# Patient Record
Sex: Female | Born: 1990 | Race: White | Hispanic: No | Smoking: Former smoker
Health system: Southern US, Community
[De-identification: ages and names within clinical notes are randomized; demographics above are authoritative.]

## PROBLEM LIST (undated history)

## (undated) ENCOUNTER — Inpatient Hospital Stay (HOSPITAL_COMMUNITY): Payer: Self-pay

## (undated) DIAGNOSIS — R87619 Unspecified abnormal cytological findings in specimens from cervix uteri: Secondary | ICD-10-CM

## (undated) DIAGNOSIS — F53 Postpartum depression: Secondary | ICD-10-CM

## (undated) DIAGNOSIS — I10 Essential (primary) hypertension: Secondary | ICD-10-CM

## (undated) DIAGNOSIS — Z3490 Encounter for supervision of normal pregnancy, unspecified, unspecified trimester: Secondary | ICD-10-CM

## (undated) DIAGNOSIS — F4310 Post-traumatic stress disorder, unspecified: Secondary | ICD-10-CM

## (undated) DIAGNOSIS — Z8619 Personal history of other infectious and parasitic diseases: Secondary | ICD-10-CM

## (undated) DIAGNOSIS — G35 Multiple sclerosis: Secondary | ICD-10-CM

## (undated) DIAGNOSIS — F99 Mental disorder, not otherwise specified: Secondary | ICD-10-CM

## (undated) DIAGNOSIS — T7840XA Allergy, unspecified, initial encounter: Secondary | ICD-10-CM

## (undated) DIAGNOSIS — E559 Vitamin D deficiency, unspecified: Secondary | ICD-10-CM

## (undated) DIAGNOSIS — B009 Herpesviral infection, unspecified: Secondary | ICD-10-CM

## (undated) DIAGNOSIS — IMO0002 Reserved for concepts with insufficient information to code with codable children: Secondary | ICD-10-CM

## (undated) DIAGNOSIS — R87629 Unspecified abnormal cytological findings in specimens from vagina: Secondary | ICD-10-CM

## (undated) DIAGNOSIS — O99345 Other mental disorders complicating the puerperium: Secondary | ICD-10-CM

## (undated) DIAGNOSIS — N390 Urinary tract infection, site not specified: Secondary | ICD-10-CM

## (undated) DIAGNOSIS — F418 Other specified anxiety disorders: Secondary | ICD-10-CM

## (undated) DIAGNOSIS — K219 Gastro-esophageal reflux disease without esophagitis: Secondary | ICD-10-CM

## (undated) DIAGNOSIS — D649 Anemia, unspecified: Secondary | ICD-10-CM

## (undated) DIAGNOSIS — F603 Borderline personality disorder: Secondary | ICD-10-CM

## (undated) DIAGNOSIS — M21371 Foot drop, right foot: Secondary | ICD-10-CM

## (undated) DIAGNOSIS — R011 Cardiac murmur, unspecified: Secondary | ICD-10-CM

## (undated) DIAGNOSIS — H469 Unspecified optic neuritis: Secondary | ICD-10-CM

## (undated) DIAGNOSIS — F419 Anxiety disorder, unspecified: Secondary | ICD-10-CM

## (undated) HISTORY — DX: Vitamin D deficiency, unspecified: E55.9

## (undated) HISTORY — DX: Anxiety disorder, unspecified: F41.9

## (undated) HISTORY — DX: Anemia, unspecified: D64.9

## (undated) HISTORY — DX: Unspecified abnormal cytological findings in specimens from vagina: R87.629

## (undated) HISTORY — DX: Allergy, unspecified, initial encounter: T78.40XA

## (undated) HISTORY — DX: Mental disorder, not otherwise specified: F99

## (undated) HISTORY — DX: Personal history of other infectious and parasitic diseases: Z86.19

## (undated) HISTORY — DX: Unspecified optic neuritis: H46.9

## (undated) HISTORY — DX: Postpartum depression: F53.0

## (undated) HISTORY — DX: Essential (primary) hypertension: I10

## (undated) HISTORY — DX: Other mental disorders complicating the puerperium: O99.345

## (undated) HISTORY — DX: Cardiac murmur, unspecified: R01.1

## (undated) HISTORY — DX: Herpesviral infection, unspecified: B00.9

## (undated) HISTORY — DX: Foot drop, right foot: M21.371

## (undated) HISTORY — DX: Reserved for concepts with insufficient information to code with codable children: IMO0002

## (undated) HISTORY — DX: Gastro-esophageal reflux disease without esophagitis: K21.9

## (undated) HISTORY — DX: Urinary tract infection, site not specified: N39.0

## (undated) HISTORY — DX: Unspecified abnormal cytological findings in specimens from cervix uteri: R87.619

---
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
## 2008-02-27 ENCOUNTER — Emergency Department (HOSPITAL_COMMUNITY): Admission: EM | Admit: 2008-02-27 | Discharge: 2008-02-27 | Payer: Self-pay | Admitting: Emergency Medicine

## 2008-04-06 ENCOUNTER — Ambulatory Visit: Payer: Self-pay | Admitting: Obstetrics and Gynecology

## 2008-04-06 ENCOUNTER — Inpatient Hospital Stay (HOSPITAL_COMMUNITY): Admission: AD | Admit: 2008-04-06 | Discharge: 2008-04-06 | Payer: Self-pay | Admitting: Family Medicine

## 2008-04-07 ENCOUNTER — Ambulatory Visit: Payer: Self-pay | Admitting: Family Medicine

## 2008-04-07 ENCOUNTER — Inpatient Hospital Stay (HOSPITAL_COMMUNITY): Admission: AD | Admit: 2008-04-07 | Discharge: 2008-04-10 | Payer: Self-pay | Admitting: Obstetrics & Gynecology

## 2009-04-23 ENCOUNTER — Other Ambulatory Visit: Admission: RE | Admit: 2009-04-23 | Discharge: 2009-04-23 | Payer: Self-pay | Admitting: Obstetrics & Gynecology

## 2010-05-15 HISTORY — PX: WISDOM TOOTH EXTRACTION: SHX21

## 2011-02-09 LAB — URINALYSIS, ROUTINE W REFLEX MICROSCOPIC
Bilirubin Urine: NEGATIVE
Glucose, UA: NEGATIVE
Hgb urine dipstick: NEGATIVE
Ketones, ur: NEGATIVE
Nitrite: NEGATIVE
Protein, ur: NEGATIVE
Specific Gravity, Urine: 1.03
Urobilinogen, UA: 0.2
pH: 5.5

## 2011-02-09 LAB — URINE MICROSCOPIC-ADD ON

## 2011-02-13 LAB — URINALYSIS, ROUTINE W REFLEX MICROSCOPIC
Bilirubin Urine: NEGATIVE
Hgb urine dipstick: NEGATIVE
Ketones, ur: 40 — AB
Nitrite: NEGATIVE
Protein, ur: NEGATIVE
Specific Gravity, Urine: 1.02
Urobilinogen, UA: 0.2
pH: 7

## 2011-02-13 LAB — URINE MICROSCOPIC-ADD ON

## 2011-02-14 LAB — CBC
HCT: 34.7 — ABNORMAL LOW
MCHC: 33.3
MCV: 85.2
Platelets: 265
RDW: 14.1

## 2011-02-14 LAB — URINE MICROSCOPIC-ADD ON

## 2011-02-14 LAB — URINALYSIS, ROUTINE W REFLEX MICROSCOPIC
Hgb urine dipstick: NEGATIVE
Protein, ur: NEGATIVE
Specific Gravity, Urine: <1.005 — ABNORMAL LOW
Urobilinogen, UA: 0.2

## 2011-02-14 LAB — COMPREHENSIVE METABOLIC PANEL
AST: 17
Albumin: 2.8 — ABNORMAL LOW
BUN: 7
Calcium: 9.3
Creatinine, Ser: 0.54
GFR calc Af Amer: >60
Total Bilirubin: 0.3
Total Protein: 6

## 2011-02-14 LAB — RPR: RPR Ser Ql: NONREACTIVE

## 2011-02-21 LAB — COMPREHENSIVE METABOLIC PANEL
ALT: 15
AST: 20
Alkaline Phosphatase: 70
CO2: 28
Calcium: 9.4
Potassium: 3.8
Sodium: 140
Total Protein: 7.5

## 2011-02-21 LAB — PREGNANCY, URINE: Preg Test, Ur: NEGATIVE

## 2011-02-21 LAB — DIFFERENTIAL
Eosinophils Absolute: 0.1 — ABNORMAL LOW
Eosinophils Relative: 1
Lymphs Abs: 2.2
Monocytes Relative: 4

## 2011-02-21 LAB — CBC
MCHC: 33.9
RBC: 4.52
RDW: 14.1

## 2011-02-21 LAB — URINALYSIS, ROUTINE W REFLEX MICROSCOPIC
Bilirubin Urine: NEGATIVE
Nitrite: NEGATIVE
Specific Gravity, Urine: 1.02
Urobilinogen, UA: 0.2

## 2011-08-23 ENCOUNTER — Other Ambulatory Visit (HOSPITAL_COMMUNITY)
Admission: RE | Admit: 2011-08-23 | Discharge: 2011-08-23 | Disposition: A | Discharge status: Home / Self Care | Payer: Medicaid Other | Source: Ambulatory Visit | Attending: Obstetrics and Gynecology | Admitting: Obstetrics and Gynecology

## 2011-08-23 DIAGNOSIS — Z01419 Encounter for gynecological examination (general) (routine) without abnormal findings: Secondary | ICD-10-CM | POA: Insufficient documentation

## 2011-08-23 DIAGNOSIS — Z113 Encounter for screening for infections with a predominantly sexual mode of transmission: Secondary | ICD-10-CM | POA: Insufficient documentation

## 2012-08-19 ENCOUNTER — Encounter: Payer: Self-pay | Admitting: Obstetrics & Gynecology

## 2012-08-19 ENCOUNTER — Ambulatory Visit (INDEPENDENT_AMBULATORY_CARE_PROVIDER_SITE_OTHER): Payer: Medicaid Other | Admitting: Obstetrics & Gynecology

## 2012-08-19 VITALS — BP 130/84 | Ht 66.0 in | Wt 142.0 lb

## 2012-08-19 DIAGNOSIS — Z3201 Encounter for pregnancy test, result positive: Secondary | ICD-10-CM

## 2012-08-19 DIAGNOSIS — N926 Irregular menstruation, unspecified: Secondary | ICD-10-CM

## 2012-08-20 ENCOUNTER — Other Ambulatory Visit: Payer: Self-pay | Admitting: Obstetrics & Gynecology

## 2012-08-20 DIAGNOSIS — O3680X0 Pregnancy with inconclusive fetal viability, not applicable or unspecified: Secondary | ICD-10-CM

## 2012-08-22 ENCOUNTER — Telehealth: Payer: Self-pay | Admitting: Adult Health

## 2012-08-22 NOTE — Telephone Encounter (Signed)
Pt states has a a discharge, white in color no odor,itching, or irritation. Pt told normal to have a discharge during pregnancy. Pt to call office back if vaginal bleeding, itching, odor, or irritation with discharge. Pt verbalized understanding.

## 2012-08-26 ENCOUNTER — Other Ambulatory Visit: Payer: Self-pay | Admitting: Obstetrics & Gynecology

## 2012-08-26 ENCOUNTER — Ambulatory Visit (INDEPENDENT_AMBULATORY_CARE_PROVIDER_SITE_OTHER): Payer: Medicaid Other

## 2012-08-26 DIAGNOSIS — O26849 Uterine size-date discrepancy, unspecified trimester: Secondary | ICD-10-CM

## 2012-08-26 DIAGNOSIS — O3680X0 Pregnancy with inconclusive fetal viability, not applicable or unspecified: Secondary | ICD-10-CM

## 2012-08-27 LAB — US OB TRANSVAGINAL

## 2012-08-28 ENCOUNTER — Telehealth: Payer: Self-pay | Admitting: Obstetrics & Gynecology

## 2012-08-28 MED ORDER — ONDANSETRON HCL 8 MG PO TABS: 8.0000 mg | ORAL_TABLET | Freq: Three times a day (TID) | ORAL | Status: DC | PRN

## 2012-08-28 NOTE — Telephone Encounter (Signed)
Pt c/o nausea and vomiting, requesting Zofran

## 2012-08-28 NOTE — Telephone Encounter (Signed)
Left message zofran ordered

## 2012-09-16 ENCOUNTER — Encounter: Payer: Self-pay | Admitting: *Deleted

## 2012-09-17 ENCOUNTER — Encounter: Payer: Self-pay | Admitting: Advanced Practice Midwife

## 2012-09-17 ENCOUNTER — Other Ambulatory Visit: Payer: Self-pay | Admitting: Obstetrics & Gynecology

## 2012-09-17 ENCOUNTER — Ambulatory Visit (INDEPENDENT_AMBULATORY_CARE_PROVIDER_SITE_OTHER): Payer: Medicaid Other | Admitting: Advanced Practice Midwife

## 2012-09-17 ENCOUNTER — Other Ambulatory Visit (HOSPITAL_COMMUNITY)
Admission: RE | Admit: 2012-09-17 | Discharge: 2012-09-17 | Disposition: A | Discharge status: Home / Self Care | Payer: Medicaid Other | Source: Ambulatory Visit | Attending: Obstetrics and Gynecology | Admitting: Obstetrics and Gynecology

## 2012-09-17 VITALS — BP 120/74 | Wt 142.5 lb

## 2012-09-17 DIAGNOSIS — B009 Herpesviral infection, unspecified: Secondary | ICD-10-CM

## 2012-09-17 DIAGNOSIS — Z3480 Encounter for supervision of other normal pregnancy, unspecified trimester: Secondary | ICD-10-CM | POA: Insufficient documentation

## 2012-09-17 DIAGNOSIS — O210 Mild hyperemesis gravidarum: Secondary | ICD-10-CM

## 2012-09-17 DIAGNOSIS — Z331 Pregnant state, incidental: Secondary | ICD-10-CM

## 2012-09-17 DIAGNOSIS — Z113 Encounter for screening for infections with a predominantly sexual mode of transmission: Secondary | ICD-10-CM | POA: Insufficient documentation

## 2012-09-17 DIAGNOSIS — Z01419 Encounter for gynecological examination (general) (routine) without abnormal findings: Secondary | ICD-10-CM | POA: Insufficient documentation

## 2012-09-17 DIAGNOSIS — Z1389 Encounter for screening for other disorder: Secondary | ICD-10-CM

## 2012-09-17 LAB — POCT URINALYSIS DIPSTICK: Nitrite, UA: NEGATIVE

## 2012-09-17 MED ORDER — ONDANSETRON HCL 8 MG PO TABS: 8.0000 mg | ORAL_TABLET | Freq: Three times a day (TID) | ORAL | Status: DC | PRN

## 2012-09-17 MED ORDER — ACYCLOVIR 400 MG PO TABS: 400.0000 mg | ORAL_TABLET | Freq: Three times a day (TID) | ORAL | Status: DC

## 2012-09-17 NOTE — Progress Notes (Signed)
Subjective:    Haley [name] is a G2P1001 [redacted]w[redacted]d being seen today for her first obstetrical visit.  Her obstetrical history is significant for HSV.  Pregnancy history fully reviewed.  Patient reports nausea. Requests 30 zofran/month  Filed Vitals:   09/17/12 1351  BP: 120/74  Weight: 142 lb 8 oz (64.638 kg)    HISTORY: OB History   Grav Para Term Preterm Abortions TAB SAB Ect Mult Living   2 1 1       1      # Outc Date GA Lbr Len/2nd Wgt Sex Del Anes PTL Lv   1 TRM 2009 [redacted]w[redacted]d  6lb9oz(2.977kg) M SVD EPI  Yes   2 CUR              Past Medical History  Diagnosis Date  . Heart murmur   . Hx of chlamydia infection   . Abnormal Pap smear   . HSV-2 (herpes simplex virus 2) infection    Past Surgical History  Procedure Laterality Date  . Wisdom tooth extraction  2012   Family History  Problem Relation Age of Onset  . Cancer Paternal Grandmother     breast  . Anxiety disorder Mother   . Heart disease Mother   . Stroke Paternal Aunt      Exam       Pelvic Exam:    Perineum: HSV on perineum:  Started 2 days ago   Vulva: normal   Vagina:  normal mucosa, normal discharge, no palpable nodules   Uterus         Cervix: normal   Adnexa: Not palpable   Urinary:  urethral meatus normal    System: Breast:  normal appearance, no masses or tenderness   Skin: normal coloration and turgor, no rashes    Neurologic: oriented, normal, normal mood   Extremities: normal strength, tone, and muscle mass   HEENT PERRLA   Mouth/Teeth mucous membranes moist, pharynx normal without lesions   Neck supple and no masses   Cardiovascular: regular rate and rhythm   Respiratory:  appears well, vitals normal, no respiratory distress, acyanotic, normal RR   Abdomen: soft, non-tender; bowel sounds normal; no masses,  no organomegaly          Assessment:    Pregnancy: G2P1001 Patient Active Problem List   Diagnosis Date Noted  . Supervision of other normal pregnancy 09/17/2012   Priority: Low  . HSV-2 (herpes simplex virus 2) infection     Priority: Low        Plan:    HSV prophylaxis starting now (FOB is serum negative) Test for CF (son is + for carrier gene) Initial labs drawn. Prenatal vitamins. Problem list reviewed and updated. Genetic Screening discussed Integrated Screen: requested.  Ultrasound discussed; fetal survey: requested.  Follow up in 4 weeks.  CRESENZO-DISHMAN,Haley [name] 09/17/2012       U/S from 08/20/12:Haley [name] is a 22 y.o. year old G2P1001 with LMP Patient's last menstrual period was 07/06/2012, unsure. which would correlate to  7 weeks 2 days gestation.  She is here today for a confirmatory initial sonogram.  Please refer to the scanned image of the sonogram worksheet  which is found in the media tab of the chart review section in the patient's EMR. All measures and details of the anatomical scan, placentation, fluid volume and pelvic anatomy are contained in that report and should be considered as the primary source of specific information for this study.  Impression:  Single viable  intrauterine pregnancy is identified with a CRL of 2 mm, corresponding to an EGA of [redacted] weeks and 3 days.  This measurement  is not in agreement with the dating as stated above.  As a result, the working EDD is now 12.12.2014.  The fetal heart rate is 104 bpm.  Otherwise the sonogram is normal.  The ovaries are normal.  The cervix is long and closed.

## 2012-09-17 NOTE — Assessment & Plan Note (Signed)
FOB is serum negative, so pt on prophylaxis 09/17/2012

## 2012-09-17 NOTE — Progress Notes (Signed)
Clear discharge with odor, questionable herpes outbreak

## 2012-09-18 LAB — URINALYSIS, ROUTINE W REFLEX MICROSCOPIC
Glucose, UA: NEGATIVE mg/dL
Leukocytes, UA: NEGATIVE
Nitrite: NEGATIVE
Protein, ur: NEGATIVE mg/dL
Urobilinogen, UA: 0.2 mg/dL (ref 0.0–1.0)

## 2012-09-18 LAB — DRUG SCREEN, URINE, NO CONFIRMATION
Cocaine Metabolites: NEGATIVE
Creatinine,U: 395 mg/dL
Opiate Screen, Urine: NEGATIVE
Phencyclidine (PCP): NEGATIVE

## 2012-09-18 LAB — CBC
Hemoglobin: 13.1 g/dL (ref 12.0–15.0)
MCH: 27.9 pg (ref 26.0–34.0)
MCHC: 33.7 g/dL (ref 30.0–36.0)
Platelets: 303 10*3/uL (ref 150–400)

## 2012-09-18 LAB — HIV ANTIBODY (ROUTINE TESTING W REFLEX): HIV: NONREACTIVE

## 2012-09-18 LAB — ANTIBODY SCREEN: Antibody Screen: NEGATIVE

## 2012-09-18 LAB — ABO AND RH: Rh Type: POSITIVE

## 2012-09-19 ENCOUNTER — Telehealth: Payer: Self-pay | Admitting: Advanced Practice Midwife

## 2012-09-19 NOTE — Telephone Encounter (Signed)
Routed to [place]. JSY

## 2012-09-23 NOTE — Telephone Encounter (Signed)
Left message. JSY 

## 2012-09-25 NOTE — Telephone Encounter (Signed)
Spoke with pt. Pt had a question about Acyclovir 400mg  1TID with 3 Refills. Advised to take the med until all gone to help decrease on chance of HSV outbreak at time of delivery. Pt will take med throughout pregnancy.

## 2012-10-01 ENCOUNTER — Telehealth: Payer: Self-pay | Admitting: Obstetrics and Gynecology

## 2012-10-01 ENCOUNTER — Other Ambulatory Visit: Payer: Self-pay | Admitting: Women's Health

## 2012-10-01 DIAGNOSIS — O219 Vomiting of pregnancy, unspecified: Secondary | ICD-10-CM

## 2012-10-01 MED ORDER — DOXYLAMINE-PYRIDOXINE 10-10 MG PO TBEC: 10.0000 mg | DELAYED_RELEASE_TABLET | ORAL | Status: DC

## 2012-10-01 NOTE — Telephone Encounter (Signed)
Pt states Zofran not helping, would like another med for N/V.

## 2012-10-01 NOTE — Telephone Encounter (Signed)
Left message that Rx for diclegis has been e-scribed to her preferred pharmacy. Marge Duncans

## 2012-10-08 DIAGNOSIS — Z79899 Other long term (current) drug therapy: Secondary | ICD-10-CM | POA: Insufficient documentation

## 2012-10-08 DIAGNOSIS — Z880 Allergy status to penicillin: Secondary | ICD-10-CM | POA: Insufficient documentation

## 2012-10-08 DIAGNOSIS — O9989 Other specified diseases and conditions complicating pregnancy, childbirth and the puerperium: Secondary | ICD-10-CM | POA: Insufficient documentation

## 2012-10-08 DIAGNOSIS — Z8619 Personal history of other infectious and parasitic diseases: Secondary | ICD-10-CM | POA: Insufficient documentation

## 2012-10-08 DIAGNOSIS — Z87891 Personal history of nicotine dependence: Secondary | ICD-10-CM | POA: Insufficient documentation

## 2012-10-08 DIAGNOSIS — K5900 Constipation, unspecified: Secondary | ICD-10-CM | POA: Insufficient documentation

## 2012-10-08 DIAGNOSIS — R011 Cardiac murmur, unspecified: Secondary | ICD-10-CM | POA: Insufficient documentation

## 2012-10-09 ENCOUNTER — Encounter (HOSPITAL_COMMUNITY): Payer: Self-pay

## 2012-10-09 ENCOUNTER — Emergency Department (HOSPITAL_COMMUNITY)
Admission: EM | Admit: 2012-10-09 | Discharge: 2012-10-09 | Disposition: A | Discharge status: Home / Self Care | Payer: Medicaid Other | Attending: Emergency Medicine | Admitting: Emergency Medicine

## 2012-10-09 DIAGNOSIS — Z3490 Encounter for supervision of normal pregnancy, unspecified, unspecified trimester: Secondary | ICD-10-CM

## 2012-10-09 DIAGNOSIS — K5900 Constipation, unspecified: Secondary | ICD-10-CM

## 2012-10-09 HISTORY — DX: Encounter for supervision of normal pregnancy, unspecified, unspecified trimester: Z34.90

## 2012-10-09 MED ORDER — BISACODYL 10 MG RE SUPP
10.0000 mg | Freq: Once | RECTAL | Status: AC
  Administered 2012-10-09: 10 mg via RECTAL
  Filled 2012-10-09: qty 1

## 2012-10-09 NOTE — ED Notes (Signed)
Pt reporting some minor results from suppository, but states she would really like to try the clear drink she had before.  Pt unsure what it was she took that worked, but thinks it might be mag citrate.  EDP aware, and in to talk to pt.

## 2012-10-09 NOTE — ED Provider Notes (Signed)
History     CSN: [id number]  Arrival date & time 10/08/12  2348   First MD Initiated Contact with Patient 10/09/12 0054      Chief Complaint  Patient presents with  . Constipation    (Consider location/radiation/quality/duration/timing/severity/associated sxs/prior treatment) HPI  Patient states she is 11 weeks 5 days pregnant. She states she has chronic nausea and takes Zofran twice a day. She reports she thinks that Zofran is making her constipated. She states she has not had a bowel movement in one month. She states every other day she'll pass 3 "pellets". She states she has pain in her rectum and her abdomen. She states she had some dry heaves earlier today. She denies any fever, vaginal bleeding or spotting. She states her appetite however is normal. She states all she is taking is MiraLAX one dose every other day.  OB Dr. Emelda Fear  Past Medical History  Diagnosis Date  . Heart murmur   . Hx of chlamydia infection   . Abnormal Pap smear   . HSV-2 (herpes simplex virus 2) infection   . Pregnant     Past Surgical History  Procedure Laterality Date  . Wisdom tooth extraction  2012    Family History  Problem Relation Age of Onset  . Cancer Paternal Grandmother     breast  . Anxiety disorder Mother   . Heart disease Mother   . Stroke Paternal Aunt     History  Substance Use Topics  . Smoking status: Former Smoker    Types: Cigarettes    Quit date: 12/14/2010  . Smokeless tobacco: Never Used  . Alcohol Use: No   unemployed  OB History   Grav Para Term Preterm Abortions TAB SAB Ect Mult Living   2 1 1       1       Review of Systems  All other systems reviewed and are negative.    Allergies  Penicillins  Home Medications   Current Outpatient Rx  Name  Route  Sig  Dispense  Refill  . acyclovir (ZOVIRAX) 400 MG tablet   Oral   Take 1 tablet (400 mg total) by mouth 3 (three) times daily.   90 tablet   3   . Doxylamine-Pyridoxine (DICLEGIS) 10-10  MG TBEC   Oral   Take 10 mg by mouth See admin instructions.   100 tablet   4     2 tabs q hs, if sx persist add 1 tab q am on day 3 ...   . ondansetron (ZOFRAN) 8 MG tablet   Oral   Take 1 tablet (8 mg total) by mouth every 8 (eight) hours as needed for nausea.   30 tablet   3   . polyethylene glycol (MIRALAX / GLYCOLAX) packet   Oral   Take 17 g by mouth daily.         . Prenatal Vit-Fe Fumarate-FA (PRENATAL MULTIVITAMIN) TABS   Oral   Take 1 tablet by mouth daily at 12 noon.         . Pediatric Multiple Vit-C-FA (FLINSTONES GUMMIES OMEGA-3 DHA PO)   Oral   Take by mouth 2 (two) times daily.           BP 133/65  Pulse 93  Temp(Src) 99.9 F (37.7 C) (Oral)  Resp 20  Ht 5\' 1"  (1.549 m)  Wt 141 lb (63.957 kg)  BMI 26.66 kg/m2  SpO2 100%  LMP 07/21/2012  Vital signs normal  Physical Exam  Nursing note and vitals reviewed. Constitutional: She is oriented to person, place, and time. She appears well-developed and well-nourished.  Non-toxic appearance. She does not appear ill. She appears distressed.  HENT:  Head: Normocephalic and atraumatic.  Right Ear: External ear normal.  Left Ear: External ear normal.  Nose: Nose normal. No mucosal edema or rhinorrhea.  Mouth/Throat: Oropharynx is clear and moist and mucous membranes are normal. No dental abscesses or edematous.  Eyes: Conjunctivae and EOM are normal. Pupils are equal, round, and reactive to light.  Neck: Normal range of motion and full passive range of motion without pain. Neck supple.  Cardiovascular: Normal rate, regular rhythm and normal heart sounds.  Exam reveals no gallop and no friction rub.   No murmur heard. Pulmonary/Chest: Effort normal and breath sounds normal. No respiratory distress. She has no wheezes. She has no rhonchi. She has no rales. She exhibits no tenderness and no crepitus.  Abdominal: Soft. Normal appearance and bowel sounds are normal. She exhibits no distension. There is  tenderness. There is no rebound and no guarding.  Mildly tender but soft abdomen  Musculoskeletal: Normal range of motion. She exhibits no edema and no tenderness.  Moves all extremities well.   Neurological: She is alert and oriented to person, place, and time. She has normal strength. No cranial nerve deficit.  Skin: Skin is warm, dry and intact. No rash noted. No erythema. No pallor.  Psychiatric: Her speech is normal and behavior is normal. Her mood appears not anxious.  anxious    ED Course  Procedures (including critical care time)  Medications  bisacodyl (DULCOLAX) suppository 10 mg (10 mg Rectal Given 10/09/12 0140)    Patient had small results after her suppository. We discussed what she can take at home for her constipation.   1. Constipation   2. Pregnancy     Meds OTC miralax and dulcolax suppositories  Plan discharge  Devoria Albe, MD, FACEP    MDM          Ward Givens, MD 10/09/12 [phone]

## 2012-10-09 NOTE — ED Notes (Signed)
Pt states she is approx [redacted] weeks pregnant, has been constipated and has not had a good bowel movement for about  A month.  Pt using miralax without relief

## 2012-10-09 NOTE — ED Notes (Signed)
Pt reporting she has been constipated for several weeks.  States that this happened with previous pregnancy and she had relief with "some clear drink".  Pt has been taking Miralax at home with no relief.

## 2012-10-15 ENCOUNTER — Encounter: Payer: Self-pay | Admitting: Women's Health

## 2012-10-15 ENCOUNTER — Other Ambulatory Visit: Payer: Self-pay | Admitting: Obstetrics & Gynecology

## 2012-10-15 ENCOUNTER — Ambulatory Visit (INDEPENDENT_AMBULATORY_CARE_PROVIDER_SITE_OTHER): Payer: Medicaid Other | Admitting: Women's Health

## 2012-10-15 ENCOUNTER — Ambulatory Visit (INDEPENDENT_AMBULATORY_CARE_PROVIDER_SITE_OTHER): Payer: Medicaid Other

## 2012-10-15 VITALS — BP 120/80 | Wt 141.4 lb

## 2012-10-15 DIAGNOSIS — Z1389 Encounter for screening for other disorder: Secondary | ICD-10-CM

## 2012-10-15 DIAGNOSIS — Z3482 Encounter for supervision of other normal pregnancy, second trimester: Secondary | ICD-10-CM

## 2012-10-15 DIAGNOSIS — O9989 Other specified diseases and conditions complicating pregnancy, childbirth and the puerperium: Secondary | ICD-10-CM

## 2012-10-15 DIAGNOSIS — B009 Herpesviral infection, unspecified: Secondary | ICD-10-CM

## 2012-10-15 DIAGNOSIS — Z36 Encounter for antenatal screening of mother: Secondary | ICD-10-CM

## 2012-10-15 DIAGNOSIS — O98519 Other viral diseases complicating pregnancy, unspecified trimester: Secondary | ICD-10-CM

## 2012-10-15 DIAGNOSIS — Z331 Pregnant state, incidental: Secondary | ICD-10-CM

## 2012-10-15 DIAGNOSIS — Z3480 Encounter for supervision of other normal pregnancy, unspecified trimester: Secondary | ICD-10-CM

## 2012-10-15 LAB — POCT URINALYSIS DIPSTICK
Blood, UA: NEGATIVE
Glucose, UA: NEGATIVE
Leukocytes, UA: NEGATIVE
Nitrite, UA: NEGATIVE

## 2012-10-15 NOTE — Progress Notes (Signed)
Right ankle hurts. Pain in left side. Major constipation issues.

## 2012-10-15 NOTE — Progress Notes (Addendum)
Denies cramping, lof, vb, urinary frequency, urgency, hesitancy, or dysuria.  Reports RLP and constipation not relieved by qod miralax.  Reviewed warning s/s to report and constipation relief measures.  All questions answered. 1st IT/NT today. 3 samples [facility] given per request. F/U in 4wks for visit and 2nd IT.

## 2012-10-15 NOTE — Patient Instructions (Signed)
Constipation  Drink plenty of fluid, preferably water, throughout the day  Eat foods high in fiber such as fruits, vegetables, and grains  Exercise, such as walking, is a good way to keep your bowels regular  Drink warm fluids, especially warm prune juice, or decaf coffee  Eat a 1/2 cup of real oatmeal (not instant), 1/2 cup applesauce, and 1/2-1 cup warm prune juice every day  If needed, you may take Colace (docusate sodium) stool softener once or twice a day to help keep the stool soft. If you are pregnant, wait until you are out of your first trimester (12-14 weeks of pregnancy)  If you still are having problems with constipation, you may take Miralax once daily as needed to help keep your bowels regular.  If you are pregnant, wait until you are out of your first trimester (12-14 weeks of pregnancy)    Pregnancy - Second Trimester The second trimester is the period between 13 to 27 weeks of your pregnancy. It is important to follow your doctor's instructions. HOME CARE   Do not smoke.  Do not drink alcohol or use drugs.  Only take medicine as told by your doctor.  Take prenatal vitamins as told. The vitamin should contain 1 milligram of folic acid.  Exercise.  Eat healthy foods. Eat regular, well-balanced meals.  You can have sex (intercourse) if there are no other problems with the pregnancy.  Do not use hot tubs, steam rooms, or saunas.  Wear a seat belt while driving.  Avoid raw meat, uncooked cheese, and litter boxes and soil used by cats.  Visit your dentist. Shirlee [place] are okay. GET HELP RIGHT AWAY IF:   You have a temperature by mouth above 102 F (38.9 C), not controlled by medicine.  Fluid is coming from your vagina.  Blood is coming from your vagina. Light spotting is common, especially after sex (intercourse).  You have a bad smelling fluid (discharge) coming from the vagina. The fluid changes from clear to white.  You still feel sick to your stomach  (nauseous).  You throw up (vomit) blood.  You lose or gain more than 2 pounds (0.9 kilograms) of weight in a week, or as suggested by your doctor.  Your face, hands, feet, or legs get puffy (swell).  You get exposed to [place] measles and have never had them.  You get exposed to fifth disease or chickenpox.  You have belly (abdominal) pain.  You have a bad headache that will not go away.  You have watery poop (diarrhea), pain when you pee (urinate), or have shortness of breath.  You start to have problems seeing (blurry or double vision).  You fall, are in a car accident, or have any kind of trauma.  There is mental or physical violence at home.  You have any concerns or worries during your pregnancy. MAKE SURE YOU:   Understand these instructions.  Will watch your condition.  Will get help right away if you are not doing well or get worse. Document Released: 07/26/2009 Document Revised: 07/24/2011 Document Reviewed: 07/26/2009 [facility] Patient Information 2014 [place], [place].

## 2012-10-15 NOTE — Progress Notes (Signed)
U/S-(12+4wks) single IUP with +FCA noted, CRL c/w prior u/s dates, NB present, NT=1.108mm, cx long and closed, bilateral adnexa wnl

## 2012-11-05 ENCOUNTER — Encounter (HOSPITAL_COMMUNITY): Payer: Self-pay | Admitting: *Deleted

## 2012-11-05 ENCOUNTER — Telehealth: Payer: Self-pay | Admitting: Obstetrics and Gynecology

## 2012-11-05 ENCOUNTER — Inpatient Hospital Stay (HOSPITAL_COMMUNITY)
Admission: AD | Admit: 2012-11-05 | Discharge: 2012-11-05 | Disposition: A | Discharge status: Home / Self Care | Payer: Medicaid Other | Source: Ambulatory Visit | Attending: Obstetrics & Gynecology | Admitting: Obstetrics & Gynecology

## 2012-11-05 DIAGNOSIS — O26892 Other specified pregnancy related conditions, second trimester: Secondary | ICD-10-CM

## 2012-11-05 DIAGNOSIS — N939 Abnormal uterine and vaginal bleeding, unspecified: Secondary | ICD-10-CM

## 2012-11-05 DIAGNOSIS — O99891 Other specified diseases and conditions complicating pregnancy: Secondary | ICD-10-CM | POA: Insufficient documentation

## 2012-11-05 DIAGNOSIS — O26859 Spotting complicating pregnancy, unspecified trimester: Secondary | ICD-10-CM | POA: Insufficient documentation

## 2012-11-05 DIAGNOSIS — G43909 Migraine, unspecified, not intractable, without status migrainosus: Secondary | ICD-10-CM | POA: Insufficient documentation

## 2012-11-05 LAB — URINALYSIS, ROUTINE W REFLEX MICROSCOPIC
Hgb urine dipstick: NEGATIVE
Nitrite: NEGATIVE
Protein, ur: NEGATIVE mg/dL
Urobilinogen, UA: 0.2 mg/dL (ref 0.0–1.0)

## 2012-11-05 LAB — WET PREP, GENITAL: Clue Cells Wet Prep HPF POC: NONE SEEN

## 2012-11-05 MED ORDER — SODIUM CHLORIDE 0.9 % IV BOLUS (SEPSIS)
1000.0000 mL | INTRAVENOUS | Status: AC
  Administered 2012-11-05: 1000 mL via INTRAVENOUS

## 2012-11-05 MED ORDER — BUTALBITAL-APAP-CAFFEINE 50-325-40 MG PO TABS: 1.0000 | ORAL_TABLET | Freq: Four times a day (QID) | ORAL | Status: DC | PRN

## 2012-11-05 MED ORDER — DIPHENHYDRAMINE HCL 50 MG/ML IJ SOLN
25.0000 mg | INTRAMUSCULAR | Status: AC
  Administered 2012-11-05: 25 mg via INTRAVENOUS
  Filled 2012-11-05: qty 1

## 2012-11-05 MED ORDER — DEXAMETHASONE SODIUM PHOSPHATE 10 MG/ML IJ SOLN
10.0000 mg | INTRAMUSCULAR | Status: AC
  Administered 2012-11-05: 10 mg via INTRAVENOUS
  Filled 2012-11-05: qty 1

## 2012-11-05 MED ORDER — METOCLOPRAMIDE HCL 5 MG/ML IJ SOLN
10.0000 mg | INTRAMUSCULAR | Status: AC
  Administered 2012-11-05: 10 mg via INTRAVENOUS
  Filled 2012-11-05: qty 2

## 2012-11-05 NOTE — MAU Provider Note (Signed)
Attestation of Attending Supervision of Advanced Practitioner (PA/CNM/NP): Evaluation and management procedures were performed by the Advanced Practitioner under my supervision and collaboration.  I have reviewed the Advanced Practitioner's note and chart, and I agree with the management and plan.  Georgio Hattabaugh, MD, FACOG Attending Obstetrician & Gynecologist Faculty Practice, [facility]  

## 2012-11-05 NOTE — MAU Provider Note (Addendum)
Chief Complaint: No chief complaint on file.   First Provider Initiated Contact with Patient 11/05/12 2017     SUBJECTIVE HPI: Haley [name] is a 22 y.o. G2P1001 pt of [facility] at [redacted]w[redacted]d by LMP who presents to maternity admissions reporting severe h/a with onset ~24 hours ago.  She also reports vaginal spotting starting today.  She is sensitive to light and wearing sunglasses upon arrival to MAU.  She denies hx of migraines.  She denies LOF, vaginal bleeding, vaginal itching/burning, urinary symptoms, h/a, dizziness, n/v, or fever/chills.     Past Medical History  Diagnosis Date  . Heart murmur   . Hx of chlamydia infection   . Abnormal Pap smear   . HSV-2 (herpes simplex virus 2) infection   . Pregnant    Past Surgical History  Procedure Laterality Date  . Wisdom tooth extraction  2012   History   Social History  . Marital Status: Single    Spouse Name: N/A    Number of Children: N/A  . Years of Education: N/A   Occupational History  . Not on file.   Social History Main Topics  . Smoking status: Former Smoker    Types: Cigarettes    Quit date: 12/14/2010  . Smokeless tobacco: Never Used  . Alcohol Use: No  . Drug Use: No  . Sexually Active: Yes    Birth Control/ Protection: None   Other Topics Concern  . Not on file   Social History Narrative  . No narrative on file   No current facility-administered medications on file prior to encounter.   Current Outpatient Prescriptions on File Prior to Encounter  Medication Sig Dispense Refill  . ondansetron (ZOFRAN) 8 MG tablet Take 1 tablet (8 mg total) by mouth every 8 (eight) hours as needed for nausea.  30 tablet  3  . Prenatal Vit-Fe Fumarate-FA (PRENATAL MULTIVITAMIN) TABS Take 1 tablet by mouth at bedtime.        Allergies  Allergen Reactions  . Penicillins Other (See Comments)    Unknown childhood reaction    ROS: Pertinent items in HPI  OBJECTIVE Blood pressure 133/78, pulse 97, temperature 98.9 F  (37.2 C), temperature source Oral, height 5' (1.524 m), weight 64.014 kg (141 lb 2 oz), last menstrual period 07/21/2012. GENERAL: Well-developed, well-nourished female in no acute distress.  HEENT: Normocephalic HEART: normal rate RESP: normal effort ABDOMEN: Soft, non-tender EXTREMITIES: Nontender, no edema NEURO: Alert and oriented Pelvic exam: Cervix pink, visually closed, without lesion, moderate amount white creamy discharge, no bleeding noted, vaginal walls and external genitalia normal Bimanual exam: Cervix 0/long/high, firm, anterior, neg CMT, uterus nontender, ~15 week size, adnexa without tenderness, enlargement, or mass  LAB RESULTS Results for orders placed during the hospital encounter of 11/05/12 (from the past 24 hour(s))  URINALYSIS, ROUTINE W REFLEX MICROSCOPIC     Status: None   Collection Time    11/05/12  7:31 PM      Result Value Range   Color, Urine YELLOW  YELLOW   APPearance CLEAR  CLEAR   Specific Gravity, Urine 1.025  1.005 - 1.030   pH 6.0  5.0 - 8.0   Glucose, UA NEGATIVE  NEGATIVE mg/dL   Hgb urine dipstick NEGATIVE  NEGATIVE   Bilirubin Urine NEGATIVE  NEGATIVE   Ketones, ur NEGATIVE  NEGATIVE mg/dL   Protein, ur NEGATIVE  NEGATIVE mg/dL   Urobilinogen, UA 0.2  0.0 - 1.0 mg/dL   Nitrite NEGATIVE  NEGATIVE  Leukocytes, UA NEGATIVE  NEGATIVE  WET PREP, GENITAL     Status: Abnormal   Collection Time    11/05/12  8:54 PM      Result Value Range   Yeast Wet Prep HPF POC NONE SEEN  NONE SEEN   Trich, Wet Prep NONE SEEN  NONE SEEN   Clue Cells Wet Prep HPF POC NONE SEEN  NONE SEEN   WBC, Wet Prep HPF POC MODERATE (*) NONE SEEN   In MAU: NS 1000 ml, Benadryl 25 mg IV, Reglan 10 mg IV, Decadron 10 mg IV   Report to [place], CNM   [employer] Certified Nurse-Midwife 11/05/2012  8:21 PM  2145: HA resolved.   Assessment: 1. Migraine   2. Vaginal spotting   3. Headache in pregnancy, second trimester     Plan: D/C home in  stable condition. Comfort measures. Pelvic rest x 1 week. Bleeding precautions. GC/CT pending. Follow-up Information   Follow up with [facility]. (Return to MAU as needed)    Contact information:   [address] [place] [address] [phone]       Medication List    TAKE these medications       acetaminophen 500 MG tablet  Commonly known as:  TYLENOL  Take 500 mg by mouth every 6 (six) hours as needed for pain (headache).     butalbital-acetaminophen-caffeine 50-325-40 MG per tablet  Commonly known as:  FIORICET  Take 1-2 tablets by mouth every 6 (six) hours as needed for headache.     ondansetron 8 MG tablet  Commonly known as:  ZOFRAN  Take 1 tablet (8 mg total) by mouth every 8 (eight) hours as needed for nausea.     prenatal multivitamin Tabs  Take 1 tablet by mouth at bedtime.       [place], [place] 11/05/2012 9:54 PM

## 2012-11-05 NOTE — MAU Note (Addendum)
PT SAYS  SHE HAS HAD  H/A  SINCE 8 PM LAST NIGHT .  TOOK 1 XS STRENGTH-   LAST NIGHT AT 9 PM . AND ANOTHER  AT 11 AM TODAY-    SAYS  THIS IS WORSE H/A SHE HAS HAD IN LIFE- NO HX OF H/A.    LAST ATE AT 11AM - BREAD- WITH TYLENOL-  VOMITED  AT 535PM AND AGAIN    AT 615PM.      COULD NOT EAT  ALL DAY   BECAUSE OF H/A.      TOOK ZOFRAN - LAST AT 11AM.  WAS TAKING DICLEGIS-  MTH AGO.  SHE CALLED [facility]-  TOLD TO COME IN.   NO BP PROBLEMS  IN OFFICE     PT HAS SUNGLASSES ON IN TRIAGE.-  NO VOMITING.

## 2012-11-05 NOTE — Telephone Encounter (Signed)
Spoke with pt. Has had a terrible headache since last pm. Took Tylenol with no help. Hurts to even open eyes. Pt in tears on the phone. Vaginal spotting, brown. No cramping. No recent bowel movement or intercourse. Advised to go to MAU at [facility]. Pt said she will go when her boyfriend gets home at 6. JSY

## 2012-11-06 LAB — GC/CHLAMYDIA PROBE AMP: CT Probe RNA: NEGATIVE

## 2012-11-07 NOTE — MAU Provider Note (Signed)
Attestation of Attending Supervision of Advanced Practitioner (PA/CNM/NP): Evaluation and management procedures were performed by the Advanced Practitioner under my supervision and collaboration.  I have reviewed the Advanced Practitioner's note and chart, and I agree with the management and plan.  Jerran Tappan, MD, FACOG Attending Obstetrician & Gynecologist Faculty Practice, [facility]  

## 2012-11-12 ENCOUNTER — Encounter: Payer: Self-pay | Admitting: Women's Health

## 2012-11-12 ENCOUNTER — Other Ambulatory Visit: Payer: Self-pay | Admitting: Women's Health

## 2012-11-12 ENCOUNTER — Ambulatory Visit (INDEPENDENT_AMBULATORY_CARE_PROVIDER_SITE_OTHER): Payer: Medicaid Other | Admitting: Women's Health

## 2012-11-12 VITALS — BP 120/72 | Wt 145.8 lb

## 2012-11-12 DIAGNOSIS — Z3482 Encounter for supervision of other normal pregnancy, second trimester: Secondary | ICD-10-CM

## 2012-11-12 DIAGNOSIS — Z331 Pregnant state, incidental: Secondary | ICD-10-CM

## 2012-11-12 DIAGNOSIS — O98519 Other viral diseases complicating pregnancy, unspecified trimester: Secondary | ICD-10-CM

## 2012-11-12 DIAGNOSIS — Z1389 Encounter for screening for other disorder: Secondary | ICD-10-CM

## 2012-11-12 LAB — POCT URINALYSIS DIPSTICK
Glucose, UA: NEGATIVE
Nitrite, UA: NEGATIVE

## 2012-11-12 NOTE — Patient Instructions (Signed)
Round Ligament Pain The round ligament is made up of muscle and fibrous tissue. It is attached to the uterus near the fallopian tube. The round ligament is located on both sides of the uterus and helps support the position of the uterus. It usually begins in the second trimester of pregnancy when the uterus comes out of the pelvis. The pain can come and go until the baby is delivered. Round ligament pain is not a serious problem and does not cause harm to the baby. CAUSE During pregnancy the uterus grows the most from the second trimester to delivery. As it grows, it stretches and slightly twists the round ligaments. When the uterus leans from one side to the other, the round ligament on the opposite side pulls and stretches. This can cause pain. SYMPTOMS  Pain can occur on one side or both sides. The pain is usually a short, sharp, and pinching-like. Sometimes it can be a dull, lingering and aching pain. The pain is located in the lower side of the abdomen or in the groin. The pain is internal and usually starts deep in the groin and moves up to the outside of the hip area. Pain can occur with:  Sudden change in position like getting out of bed or a chair.  Rolling over in bed.  Coughing or sneezing.  Walking too much.  Any type of physical activity. DIAGNOSIS  Your caregiver will make sure there are no serious problems causing the pain. When nothing serious is found, the symptoms usually indicate that the pain is from the round ligament. TREATMENT   Sit down and relax when the pain starts.  Flex your knees up to your belly.  Lay on your side with a pillow under your belly (abdomen) and another one between your legs.  Sit in a hot bath for 15 to 20 minutes or until the pain goes away. HOME CARE INSTRUCTIONS   Only take over-the-counter or prescriptions medicines for pain, discomfort or fever as directed by your caregiver.  Sit and stand slowly.  Avoid long walks if it causes  pain.  Stop or lessen your physical activities if it causes pain. SEEK MEDICAL CARE IF:   The pain does not go away with any of your treatment.  You need stronger medication for the pain.  You develop back pain that you did not have before with the side pain. SEEK IMMEDIATE MEDICAL CARE IF:   You develop a temperature of 102 F (38.9 C) or higher.  You develop uterine contractions.  You develop vaginal bleeding.  You develop nausea, vomiting or diarrhea.  You develop chills.  You have pain when you urinate. Document Released: 02/08/2008 Document Revised: 07/24/2011 Document Reviewed: 02/08/2008 [facility] Patient Information 2014 [place], [place].  Migraine Headache A migraine headache is an intense, throbbing pain on one or both sides of your head. A migraine can last for 30 minutes to several hours. CAUSES  The exact cause of a migraine headache is not always known. However, a migraine may be caused when nerves in the brain become irritated and release chemicals that cause inflammation. This causes pain. SYMPTOMS  Pain on one or both sides of your head.  Pulsating or throbbing pain.  Severe pain that prevents daily activities.  Pain that is aggravated by any physical activity.  Nausea, vomiting, or both.  Dizziness.  Pain with exposure to bright lights, loud noises, or activity.  General sensitivity to bright lights, loud noises, or smells. Before you get a migraine,  you may get warning signs that a migraine is coming (aura). An aura may include:  Seeing flashing lights.  Seeing bright spots, halos, or zig-zag lines.  Having tunnel vision or blurred vision.  Having feelings of numbness or tingling.  Having trouble talking.  Having muscle weakness. MIGRAINE TRIGGERS  Alcohol.  Smoking.  Stress.  Menstruation.  Aged cheeses.  Foods or drinks that contain nitrates, glutamate, aspartame, or tyramine.  Lack of  sleep.  Chocolate.  Caffeine.  Hunger.  Physical exertion.  Fatigue.  Medicines used to treat chest pain (nitroglycerine), birth control pills, estrogen, and some blood pressure medicines. DIAGNOSIS  A migraine headache is often diagnosed based on:  Symptoms.  Physical examination.  A CT scan or MRI of your head. TREATMENT Medicines may be given for pain and nausea. Medicines can also be given to help prevent recurrent migraines.  HOME CARE INSTRUCTIONS  Only take over-the-counter or prescription medicines for pain or discomfort as directed by your caregiver. The use of long-term narcotics is not recommended.  Lie down in a dark, quiet room when you have a migraine.  Keep a journal to find out what may trigger your migraine headaches. For example, write down:  What you eat and drink.  How much sleep you get.  Any change to your diet or medicines.  Limit alcohol consumption.  Quit smoking if you smoke.  Get 7 to 9 hours of sleep, or as recommended by your caregiver.  Limit stress.  Keep lights dim if bright lights bother you and make your migraines worse. SEEK IMMEDIATE MEDICAL CARE IF:   Your migraine becomes severe.  You have a fever.  You have a stiff neck.  You have vision loss.  You have muscular weakness or loss of muscle control.  You start losing your balance or have trouble walking.  You feel faint or pass out.  You have severe symptoms that are different from your first symptoms. MAKE SURE YOU:   Understand these instructions.  Will watch your condition.  Will get help right away if you are not doing well or get worse. Document Released: 05/01/2005 Document Revised: 07/24/2011 Document Reviewed: 04/21/2011 [facility] Patient Information 2014 [place], [place].

## 2012-11-12 NOTE — Progress Notes (Signed)
Denies cramping, vb, urinary frequency, urgency, hesitancy, or dysuria. Has migraine, will go home and take fioricet. Reports Lt sided RLP.  Reviewed RLP relief measures, migraine prevention/relief, warning s/s to report.  All questions answered. 2nd IT today. F/U in 3wks for anatomy u/s and visit.

## 2012-11-12 NOTE — Progress Notes (Signed)
Pt here today for routine visit. Pt states she has still had some pain on her left side close to her ovary.

## 2012-11-16 ENCOUNTER — Emergency Department (HOSPITAL_COMMUNITY)
Admission: EM | Admit: 2012-11-16 | Discharge: 2012-11-16 | Disposition: A | Discharge status: Home / Self Care | Payer: Medicaid Other | Attending: Emergency Medicine | Admitting: Emergency Medicine

## 2012-11-16 ENCOUNTER — Encounter (HOSPITAL_COMMUNITY): Payer: Self-pay | Admitting: *Deleted

## 2012-11-16 DIAGNOSIS — IMO0001 Reserved for inherently not codable concepts without codable children: Secondary | ICD-10-CM

## 2012-11-16 DIAGNOSIS — R059 Cough, unspecified: Secondary | ICD-10-CM | POA: Insufficient documentation

## 2012-11-16 DIAGNOSIS — R011 Cardiac murmur, unspecified: Secondary | ICD-10-CM | POA: Insufficient documentation

## 2012-11-16 DIAGNOSIS — Z8619 Personal history of other infectious and parasitic diseases: Secondary | ICD-10-CM | POA: Insufficient documentation

## 2012-11-16 DIAGNOSIS — J00 Acute nasopharyngitis [common cold]: Secondary | ICD-10-CM | POA: Insufficient documentation

## 2012-11-16 DIAGNOSIS — O9989 Other specified diseases and conditions complicating pregnancy, childbirth and the puerperium: Secondary | ICD-10-CM | POA: Insufficient documentation

## 2012-11-16 DIAGNOSIS — Z87891 Personal history of nicotine dependence: Secondary | ICD-10-CM | POA: Insufficient documentation

## 2012-11-16 DIAGNOSIS — R05 Cough: Secondary | ICD-10-CM | POA: Insufficient documentation

## 2012-11-16 DIAGNOSIS — B338 Other specified viral diseases: Secondary | ICD-10-CM | POA: Insufficient documentation

## 2012-11-16 DIAGNOSIS — J3489 Other specified disorders of nose and nasal sinuses: Secondary | ICD-10-CM | POA: Insufficient documentation

## 2012-11-16 DIAGNOSIS — B349 Viral infection, unspecified: Secondary | ICD-10-CM

## 2012-11-16 DIAGNOSIS — Z880 Allergy status to penicillin: Secondary | ICD-10-CM | POA: Insufficient documentation

## 2012-11-16 NOTE — ED Provider Notes (Signed)
History    CSN: [id number] Arrival date & time 11/16/12  0015  First MD Initiated Contact with Patient 11/16/12 0025     Chief Complaint  Patient presents with  . Cough   (Consider location/radiation/quality/duration/timing/severity/associated sxs/prior Treatment) HPI HPI Comments: Haley [name] is a 22 y.o. female, [redacted] weeks pregnant, who presents to the Emergency Department complaining of cough and runny nose since Monday. She denies fever, chills, shortness of breath.   PCP  Dr. Gerda Diss  OB/GYN Dr. Despina Hidden Past Medical History  Diagnosis Date  . Heart murmur   . Hx of chlamydia infection   . Abnormal Pap smear   . HSV-2 (herpes simplex virus 2) infection   . Pregnant    Past Surgical History  Procedure Laterality Date  . Wisdom tooth extraction  2012   Family History  Problem Relation Age of Onset  . Cancer Paternal Grandmother     breast  . Anxiety disorder Mother   . Heart disease Mother   . Stroke Paternal Aunt    History  Substance Use Topics  . Smoking status: Former Smoker    Types: Cigarettes    Quit date: 12/14/2010  . Smokeless tobacco: Never Used  . Alcohol Use: No   OB History   Grav Para Term Preterm Abortions TAB SAB Ect Mult Living   2 1 1       1      Review of Systems  Constitutional: Negative for fever.       10 Systems reviewed and are negative for acute change except as noted in the HPI.  HENT: Positive for congestion and rhinorrhea.   Eyes: Negative for discharge and redness.  Respiratory: Positive for cough. Negative for shortness of breath.   Cardiovascular: Negative for chest pain.  Gastrointestinal: Negative for vomiting and abdominal pain.  Musculoskeletal: Negative for back pain.  Skin: Negative for rash.  Neurological: Negative for syncope, numbness and headaches.  Psychiatric/Behavioral:       No behavior change.    Allergies  Penicillins  Home Medications   Current Outpatient Rx  Name  Route  Sig  Dispense  Refill  .  acetaminophen (TYLENOL) 500 MG tablet   Oral   Take 500 mg by mouth every 6 (six) hours as needed for pain (headache).         . butalbital-acetaminophen-caffeine (FIORICET) 50-325-40 MG per tablet   Oral   Take 1-2 tablets by mouth every 6 (six) hours as needed for headache.   20 tablet   1   . ondansetron (ZOFRAN) 8 MG tablet   Oral   Take 1 tablet (8 mg total) by mouth every 8 (eight) hours as needed for nausea.   30 tablet   3   . Prenatal Vit-Fe Fumarate-FA (PRENATAL MULTIVITAMIN) TABS   Oral   Take 1 tablet by mouth at bedtime.           BP 139/85  Pulse 100  Temp(Src) 98.6 F (37 C)  Resp 20  Ht 5\' 1"  (1.549 m)  Wt 142 lb (64.411 kg)  BMI 26.84 kg/m2  SpO2 100%  LMP 07/21/2012 Physical Exam  Nursing note and vitals reviewed. Constitutional: She appears well-developed and well-nourished.  Awake, alert, nontoxic appearance.  HENT:  Head: Normocephalic and atraumatic.  Right Ear: External ear normal.  Left Ear: External ear normal.  Eyes: EOM are normal. Pupils are equal, round, and reactive to light. Right eye exhibits no discharge. Left eye exhibits no discharge.  Neck: Normal range of motion. Neck supple.  Cardiovascular: Normal rate and intact distal pulses.   Pulmonary/Chest: Effort normal and breath sounds normal. She exhibits no tenderness.  Abdominal: Soft. Bowel sounds are normal. There is no tenderness. There is no rebound.  Musculoskeletal: She exhibits no tenderness.  Baseline ROM, no obvious new focal weakness.  Neurological:  Mental status and motor strength appears baseline for patient and situation.  Skin: No rash noted.  Psychiatric: She has a normal mood and affect.    ED Course  Procedures (including critical care time)   MDM  Patient here with a viral illness associated with nasal congestion and a cough. Discussed use of normal saline nose drops and cough medicine. Pt stable in ED with no significant deterioration in condition.The  patient appears reasonably screened and/or stabilized for discharge and I doubt any other medical condition or other [facility] requiring further screening, evaluation, or treatment in the ED at this time prior to discharge.  MDM Reviewed: nursing note and vitals     Nicoletta Dress. Colon Branch, MD 11/16/12 1478

## 2012-11-16 NOTE — ED Notes (Signed)
States cough since Monday, states starting to have a productive cough.

## 2012-11-20 LAB — MATERNAL SCREEN, INTEGRATED #2
Crown Rump Length: 64.2 mm
Estriol Mom: 0.69
Inhibin A Dimeric: 1105 pg/mL
Rish for ONTD: <1:5000 {titer}
hCG MoM: 2.47
hCG, Serum: 59.1 IU/mL

## 2012-11-21 ENCOUNTER — Encounter: Payer: Self-pay | Admitting: Women's Health

## 2012-11-25 ENCOUNTER — Telehealth: Payer: Self-pay | Admitting: Women's Health

## 2012-11-25 NOTE — Telephone Encounter (Signed)
Left message for pt to return call regarding IT screening

## 2012-11-26 ENCOUNTER — Telehealth: Payer: Self-pay | Admitting: Women's Health

## 2012-11-26 ENCOUNTER — Encounter: Payer: Self-pay | Admitting: Women's Health

## 2012-11-26 NOTE — Telephone Encounter (Signed)
Notified pt of +IT increased r/f DS, appt for Harmony and u/s scheduled for tomorrow @ 1000. Questions answered, pt knows to come tomorrow.

## 2012-11-26 NOTE — Telephone Encounter (Signed)
2nd day attempting to notify pt of NT/IT results. Left message for her to return call.

## 2012-11-27 ENCOUNTER — Other Ambulatory Visit: Payer: Medicaid Other

## 2012-11-27 ENCOUNTER — Other Ambulatory Visit (INDEPENDENT_AMBULATORY_CARE_PROVIDER_SITE_OTHER): Payer: Medicaid Other

## 2012-11-27 ENCOUNTER — Other Ambulatory Visit: Payer: Self-pay | Admitting: Adult Health

## 2012-11-27 DIAGNOSIS — Z139 Encounter for screening, unspecified: Secondary | ICD-10-CM

## 2012-11-27 DIAGNOSIS — O289 Unspecified abnormal findings on antenatal screening of mother: Secondary | ICD-10-CM

## 2012-11-27 NOTE — Progress Notes (Signed)
Anatomy screen complete, all anatomy appears nml, no sonographic evidence of TriIsomy 21. (ie. nml appearing 5th digets both hands, open hands, nasal bone, no dilated lat. Ventricles, heart anat nml, kidneys nml echo pattern, bilat feet nml, biometry symmetrical.cx closed, bilat ovs seen, fluid wnl, breech lie, post plac.

## 2012-12-02 ENCOUNTER — Ambulatory Visit (INDEPENDENT_AMBULATORY_CARE_PROVIDER_SITE_OTHER): Payer: Medicaid Other | Admitting: Women's Health

## 2012-12-02 ENCOUNTER — Encounter: Payer: Self-pay | Admitting: Women's Health

## 2012-12-02 ENCOUNTER — Ambulatory Visit: Payer: Medicaid Other

## 2012-12-02 VITALS — BP 110/80 | Wt 143.5 lb

## 2012-12-02 DIAGNOSIS — Z331 Pregnant state, incidental: Secondary | ICD-10-CM

## 2012-12-02 DIAGNOSIS — Z3482 Encounter for supervision of other normal pregnancy, second trimester: Secondary | ICD-10-CM

## 2012-12-02 DIAGNOSIS — O98519 Other viral diseases complicating pregnancy, unspecified trimester: Secondary | ICD-10-CM

## 2012-12-02 DIAGNOSIS — O219 Vomiting of pregnancy, unspecified: Secondary | ICD-10-CM

## 2012-12-02 DIAGNOSIS — Z1389 Encounter for screening for other disorder: Secondary | ICD-10-CM

## 2012-12-02 LAB — POCT URINALYSIS DIPSTICK
Blood, UA: NEGATIVE
Glucose, UA: NEGATIVE
Nitrite, UA: NEGATIVE

## 2012-12-02 MED ORDER — ONDANSETRON HCL 8 MG PO TABS: 8.0000 mg | ORAL_TABLET | Freq: Three times a day (TID) | ORAL | Status: DC | PRN

## 2012-12-02 NOTE — Patient Instructions (Signed)
Pregnancy - Second Trimester The second trimester of pregnancy (3 to 6 months) is a period of rapid growth for you and your baby. At the end of the sixth month, your baby is about 9 inches long and weighs 1 1/2 pounds. You will begin to feel the baby move between 18 and 20 weeks of the pregnancy. This is called quickening. Weight gain is faster. A clear fluid (colostrum) may leak out of your breasts. You may feel small contractions of the womb (uterus). This is known as false labor or Braxton-Hicks contractions. This is like a practice for labor when the baby is ready to be born. Usually, the problems with morning sickness have usually passed by the end of your first trimester. Some women develop small dark blotches (called cholasma, mask of pregnancy) on their face that usually goes away after the baby is born. Exposure to the sun makes the blotches worse. Acne may also develop in some pregnant women and pregnant women who have acne, may find that it goes away. PRENATAL EXAMS  Blood work may continue to be done during prenatal exams. These tests are done to check on your health and the probable health of your baby. Blood work is used to follow your blood levels (hemoglobin). Anemia (low hemoglobin) is common during pregnancy. Iron and vitamins are given to help prevent this. You will also be checked for diabetes between 24 and 28 weeks of the pregnancy. Some of the previous blood tests may be repeated.  The size of the uterus is measured during each visit. This is to make sure that the baby is continuing to grow properly according to the dates of the pregnancy.  Your blood pressure is checked every prenatal visit. This is to make sure you are not getting toxemia.  Your urine is checked to make sure you do not have an infection, diabetes or protein in the urine.  Your weight is checked often to make sure gains are happening at the suggested rate. This is to ensure that both you and your baby are  growing normally.  Sometimes, an ultrasound is performed to confirm the proper growth and development of the baby. This is a test which bounces harmless sound waves off the baby so your caregiver can more accurately determine due dates. Sometimes, a test is done on the amniotic fluid surrounding the baby. This test is called an amniocentesis. The amniotic fluid is obtained by sticking a needle into the belly (abdomen). This is done to check the chromosomes in instances where there is a concern about possible genetic problems with the baby. It is also sometimes done near the end of pregnancy if an early delivery is required. In this case, it is done to help make sure the baby's lungs are mature enough for the baby to live outside of the womb. CHANGES OCCURING IN THE SECOND TRIMESTER OF PREGNANCY Your body goes through many changes during pregnancy. They vary from person to person. Talk to your caregiver about changes you notice that you are concerned about.  During the second trimester, you will likely have an increase in your appetite. It is normal to have cravings for certain foods. This varies from person to person and pregnancy to pregnancy.  Your lower abdomen will begin to bulge.  You may have to urinate more often because the uterus and baby are pressing on your bladder. It is also common to get more bladder infections during pregnancy. You can help this by drinking lots of fluids   and emptying your bladder before and after intercourse.  You may begin to get stretch marks on your hips, abdomen, and breasts. These are normal changes in the body during pregnancy. There are no exercises or medicines to take that prevent this change.  You may begin to develop swollen and bulging veins (varicose veins) in your legs. Wearing support hose, elevating your feet for 15 minutes, 3 to 4 times a day and limiting salt in your diet helps lessen the problem.  Heartburn may develop as the uterus grows and  pushes up against the stomach. Antacids recommended by your caregiver helps with this problem. Also, eating smaller meals 4 to 5 times a day helps.  Constipation can be treated with a stool softener or adding bulk to your diet. Drinking lots of fluids, and eating vegetables, fruits, and whole grains are helpful.  Exercising is also helpful. If you have been very active up until your pregnancy, most of these activities can be continued during your pregnancy. If you have been less active, it is helpful to start an exercise program such as walking.  Hemorrhoids may develop at the end of the second trimester. Warm sitz baths and hemorrhoid cream recommended by your caregiver helps hemorrhoid problems.  Backaches may develop during this time of your pregnancy. Avoid heavy lifting, wear low heal shoes, and practice good posture to help with backache problems.  Some pregnant women develop tingling and numbness of their hand and fingers because of swelling and tightening of ligaments in the wrist (carpel tunnel syndrome). This goes away after the baby is born.  As your breasts enlarge, you may have to get a bigger bra. Get a comfortable, cotton, support bra. Do not get a nursing bra until the last month of the pregnancy if you will be nursing the baby.  You may get a dark line from your belly button to the pubic area called the linea nigra.  You may develop rosy cheeks because of increase blood flow to the face.  You may develop spider looking lines of the face, neck, arms, and chest. These go away after the baby is born. HOME CARE INSTRUCTIONS   It is extremely important to avoid all smoking, herbs, alcohol, and unprescribed drugs during your pregnancy. These chemicals affect the formation and growth of the baby. Avoid these chemicals throughout the pregnancy to ensure the delivery of a healthy infant.  Most of your home care instructions are the same as suggested for the first trimester of your  pregnancy. Keep your caregiver's appointments. Follow your caregiver's instructions regarding medicine use, exercise, and diet.  During pregnancy, you are providing food for you and your baby. Continue to eat regular, well-balanced meals. Choose foods such as meat, fish, milk and other low fat dairy products, vegetables, fruits, and whole-grain breads and cereals. Your caregiver will tell you of the ideal weight gain.  A physical sexual relationship may be continued up until near the end of pregnancy if there are no other problems. Problems could include early (premature) leaking of amniotic fluid from the membranes, vaginal bleeding, abdominal pain, or other medical or pregnancy problems.  Exercise regularly if there are no restrictions. Check with your caregiver if you are unsure of the safety of some of your exercises. The greatest weight gain will occur in the last 2 trimesters of pregnancy. Exercise will help you:  Control your weight.  Get you in shape for labor and delivery.  Lose weight after you have the baby.  Wear   a good support or jogging bra for breast tenderness during pregnancy. This may help if worn during sleep. Pads or tissues may be used in the bra if you are leaking colostrum.  Do not use hot tubs, steam rooms or saunas throughout the pregnancy.  Wear your seat belt at all times when driving. This protects you and your baby if you are in an accident.  Avoid raw meat, uncooked cheese, cat litter boxes, and soil used by cats. These carry germs that can cause birth defects in the baby.  The second trimester is also a good time to visit your dentist for your dental health if this has not been done yet. Getting your teeth cleaned is okay. Use a soft toothbrush. Brush gently during pregnancy.  It is easier to leak urine during pregnancy. Tightening up and strengthening the pelvic muscles will help with this problem. Practice stopping your urination while you are going to the  bathroom. These are the same muscles you need to strengthen. It is also the muscles you would use as if you were trying to stop from passing gas. You can practice tightening these muscles up 10 times a set and repeating this about 3 times per day. Once you know what muscles to tighten up, do not perform these exercises during urination. It is more likely to contribute to an infection by backing up the urine.  Ask for help if you have financial, counseling, or nutritional needs during pregnancy. Your caregiver will be able to offer counseling for these needs as well as refer you for other special needs.  Your skin may become oily. If so, wash your face with mild soap, use non-greasy moisturizer and oil or cream based makeup. MEDICINES AND DRUG USE IN PREGNANCY  Take prenatal vitamins as directed. The vitamin should contain 1 milligram of folic acid. Keep all vitamins out of reach of children. Only a couple vitamins or tablets containing iron may be fatal to a baby or young child when ingested.  Avoid use of all medicines, including herbs, over-the-counter medicines, not prescribed or suggested by your caregiver. Only take over-the-counter or prescription medicines for pain, discomfort, or fever as directed by your caregiver. Do not use aspirin.  Let your caregiver also know about herbs you may be using.  Alcohol is related to a number of birth defects. This includes fetal alcohol syndrome. All alcohol, in any form, should be avoided completely. Smoking will cause low birth rate and premature babies.  Street or illegal drugs are very harmful to the baby. They are absolutely forbidden. A baby born to an addicted mother will be addicted at birth. The baby will go through the same withdrawal an adult does. SEEK MEDICAL CARE IF:  You have any concerns or worries during your pregnancy. It is better to call with your questions if you feel they cannot wait, rather than worry about them. SEEK IMMEDIATE  MEDICAL CARE IF:   An unexplained oral temperature above 102 F (38.9 C) develops, or as your caregiver suggests.  You have leaking of fluid from the vagina (birth canal). If leaking membranes are suspected, take your temperature and tell your caregiver of this when you call.  There is vaginal spotting, bleeding, or passing clots. Tell your caregiver of the amount and how many pads are used. Light spotting in pregnancy is common, especially following intercourse.  You develop a bad smelling vaginal discharge with a change in the color from clear to white.  You continue to feel   sick to your stomach (nauseated) and have no relief from remedies suggested. You vomit blood or coffee ground-like materials.  You lose more than 2 pounds of weight or gain more than 2 pounds of weight over 1 week, or as suggested by your caregiver.  You notice swelling of your face, hands, feet, or legs.  You get exposed to German measles and have never had them.  You are exposed to fifth disease or chickenpox.  You develop belly (abdominal) pain. Round ligament discomfort is a common non-cancerous (benign) cause of abdominal pain in pregnancy. Your caregiver still must evaluate you.  You develop a bad headache that does not go away.  You develop fever, diarrhea, pain with urination, or shortness of breath.  You develop visual problems, blurry, or double vision.  You fall or are in a car accident or any kind of trauma.  There is mental or physical violence at home. Document Released: 04/25/2001 Document Revised: 01/24/2012 Document Reviewed: 10/28/2008 [employer] Patient Information 2014 [employer].  

## 2012-12-02 NOTE — Progress Notes (Signed)
Reports good fm. Denies uc's, lof, vb, urinary frequency, urgency, hesitancy, or dysuria.  No complaints.  Harmony not resulted yet. Reviewed normal u/s report from last week, warning s/s to report.  All questions answered. F/U in 4wks for visit.

## 2012-12-03 ENCOUNTER — Other Ambulatory Visit: Payer: Medicaid Other

## 2012-12-03 ENCOUNTER — Encounter: Payer: Medicaid Other | Admitting: Women's Health

## 2012-12-09 ENCOUNTER — Telehealth: Payer: Self-pay | Admitting: Adult Health

## 2012-12-09 NOTE — Telephone Encounter (Signed)
Pt aware that harmony test came back low risk for all 3.

## 2012-12-27 ENCOUNTER — Encounter (HOSPITAL_COMMUNITY): Payer: Self-pay | Admitting: *Deleted

## 2012-12-27 ENCOUNTER — Inpatient Hospital Stay (HOSPITAL_COMMUNITY)
Admission: AD | Admit: 2012-12-27 | Discharge: 2012-12-27 | Disposition: A | Discharge status: Home / Self Care | Payer: Medicaid Other | Source: Ambulatory Visit | Attending: Family Medicine | Admitting: Family Medicine

## 2012-12-27 DIAGNOSIS — O368190 Decreased fetal movements, unspecified trimester, not applicable or unspecified: Secondary | ICD-10-CM | POA: Insufficient documentation

## 2012-12-27 DIAGNOSIS — Z3482 Encounter for supervision of other normal pregnancy, second trimester: Secondary | ICD-10-CM

## 2012-12-27 NOTE — MAU Note (Signed)
PT SAYS AT 10PM-  HER ABD STARTED TIGHTENING-     GETS [facility].  PT CALLED CALL- A- NURSE-  TOLD  [employer] -   AND BABY DIDN'T  MOVE.  LAST MOVEMENT WAS AT 8PM

## 2012-12-27 NOTE — MAU Provider Note (Signed)
Chart reviewed and agree with management and plan.  

## 2012-12-27 NOTE — MAU Provider Note (Signed)
First Provider Initiated Contact with Patient 12/27/12 [phone]      Chief Complaint: decreased fetal movement  Conception Haley [name] is  22 y.o. G2P1001 at [redacted]w[redacted]d presents complaining of No chief complaint on file. .  Last feltmovement at 2000.  Called call a nurse and was told to come in Obstetrical/Gynecological History: OB History   Grav Para Term Preterm Abortions TAB SAB Ect Mult Living   2 1 1       1      Past Medical History: Past Medical History  Diagnosis Date  . Heart murmur   . Hx of chlamydia infection   . Abnormal Pap smear   . HSV-2 (herpes simplex virus 2) infection   . Pregnant     Past Surgical History: Past Surgical History  Procedure Laterality Date  . Wisdom tooth extraction  2012    Family History: Family History  Problem Relation Age of Onset  . Cancer Paternal Grandmother     breast  . Anxiety disorder Mother   . Heart disease Mother   . Stroke Paternal Aunt     Social History: History  Substance Use Topics  . Smoking status: Former Smoker    Types: Cigarettes    Quit date: 12/14/2010  . Smokeless tobacco: Never Used  . Alcohol Use: No    Allergies:  Allergies  Allergen Reactions  . Penicillins Other (See Comments)    Unknown childhood reaction    Meds:  Prescriptions prior to admission  Medication Sig Dispense Refill  . acetaminophen (TYLENOL) 500 MG tablet Take 500 mg by mouth every 6 (six) hours as needed for pain (headache).      . butalbital-acetaminophen-caffeine (FIORICET) 50-325-40 MG per tablet Take 1-2 tablets by mouth every 6 (six) hours as needed for headache.  20 tablet  1  . ondansetron (ZOFRAN) 8 MG tablet Take 1 tablet (8 mg total) by mouth every 8 (eight) hours as needed for nausea.  30 tablet  3  . Prenatal Vit-Fe Fumarate-FA (PRENATAL MULTIVITAMIN) TABS Take 1 tablet by mouth at bedtime.         Review of Systems   Constitutional: Negative for fever and chills Eyes: Negative for visual disturbances Respiratory:  Negative for shortness of breath, dyspnea Cardiovascular: Negative for chest pain or palpitations  Gastrointestinal: Negative for vomiting, diarrhea and constipation Genitourinary: Negative for dysuria and urgency Musculoskeletal: Negative for back pain, joint pain, myalgias  Neurological: Negative for dizziness and headaches    Physical Exam  Blood pressure 129/90, pulse 85, temperature 99.4 F (37.4 C), temperature source Oral, resp. rate 20, last menstrual period 07/21/2012. GENERAL: Well-developed, well-nourished female in no acute distress.  LUNGS: Clear to auscultation bilaterally.  HEART: Regular rate and rhythm. ABDOMEN: Soft, nontender, nondistended, gravid.  EXTREMITIES: Nontender, no edema, 2+ distal pulses. DTR's 2+ CERVICAL EXAM: Dilatation 0cm   Effacement 0%   Station out of pelvis   Presentation: unsure FHT:  Baseline rate 150 bpm   Variability moderate  Accelerations present   Decelerations none Contractions: Every 0 mins       Assessment: Haley [name] is  22 y.o. G2P1001 at [redacted]w[redacted]d presents with decreased fetal movement.  Baby moving now  Plan: D/c  Haley [name],Haley [name] 8/15/20145:44 AM

## 2012-12-30 ENCOUNTER — Encounter: Payer: Medicaid Other | Admitting: Obstetrics & Gynecology

## 2013-01-01 ENCOUNTER — Encounter: Payer: Self-pay | Admitting: Advanced Practice Midwife

## 2013-01-01 ENCOUNTER — Ambulatory Visit (INDEPENDENT_AMBULATORY_CARE_PROVIDER_SITE_OTHER): Payer: Medicaid Other | Admitting: Advanced Practice Midwife

## 2013-01-01 VITALS — BP 120/80 | Wt 146.0 lb

## 2013-01-01 DIAGNOSIS — O98519 Other viral diseases complicating pregnancy, unspecified trimester: Secondary | ICD-10-CM

## 2013-01-01 DIAGNOSIS — Z1389 Encounter for screening for other disorder: Secondary | ICD-10-CM

## 2013-01-01 DIAGNOSIS — Z331 Pregnant state, incidental: Secondary | ICD-10-CM

## 2013-01-01 LAB — POCT URINALYSIS DIPSTICK
Glucose, UA: NEGATIVE
Ketones, UA: NEGATIVE

## 2013-01-01 NOTE — Patient Instructions (Addendum)
Sciatica °Sciatica is pain, weakness, numbness, or tingling along the path of the sciatic nerve. The nerve starts in the lower back and runs down the back of each leg. The nerve controls the muscles in the lower leg and in the back of the knee, while also providing sensation to the back of the thigh, lower leg, and the sole of your foot. Sciatica is a symptom of another medical condition. For instance, nerve damage or certain conditions, such as a herniated disk or bone spur on the spine, pinch or put pressure on the sciatic nerve. This causes the pain, weakness, or other sensations normally associated with sciatica. Generally, sciatica only affects one side of the body. °CAUSES  °· Herniated or slipped disc. °· Degenerative disk disease. °· A pain disorder involving the narrow muscle in the buttocks (piriformis syndrome). °· Pelvic injury or fracture. °· Pregnancy. °· Tumor (rare). °SYMPTOMS  °Symptoms can vary from mild to very severe. The symptoms usually travel from the low back to the buttocks and down the back of the leg. Symptoms can include: °· Mild tingling or dull aches in the lower back, leg, or hip. °· Numbness in the back of the calf or sole of the foot. °· Burning sensations in the lower back, leg, or hip. °· Sharp pains in the lower back, leg, or hip. °· Leg weakness. °· Severe back pain inhibiting movement. °These symptoms may get worse with coughing, sneezing, laughing, or prolonged sitting or standing. Also, being overweight may worsen symptoms. °DIAGNOSIS  °Your caregiver will perform a physical exam to look for common symptoms of sciatica. He or she may ask you to do certain movements or activities that would trigger sciatic nerve pain. Other tests may be performed to find the cause of the sciatica. These may include: °· Blood tests. °· X-rays. °· Imaging tests, such as an MRI or CT scan. °TREATMENT  °Treatment is directed at the cause of the sciatic pain. Sometimes, treatment is not necessary  and the pain and discomfort goes away on its own. If treatment is needed, your caregiver may suggest: °· Over-the-counter medicines to relieve pain. °· Prescription medicines, such as anti-inflammatory medicine, muscle relaxants, or narcotics. °· Applying heat or ice to the painful area. °· Steroid injections to lessen pain, irritation, and inflammation around the nerve. °· Reducing activity during periods of pain. °· Exercising and stretching to strengthen your abdomen and improve flexibility of your spine. Your caregiver may suggest losing weight if the extra weight makes the back pain worse. °· Physical therapy. °· Surgery to eliminate what is pressing or pinching the nerve, such as a bone spur or part of a herniated disk. °HOME CARE INSTRUCTIONS  °· Only take over-the-counter or prescription medicines for pain or discomfort as directed by your caregiver. °· Apply ice to the affected area for 20 minutes, 3 4 times a day for the first 48 72 hours. Then try heat in the same way. °· Exercise, stretch, or perform your usual activities if these do not aggravate your pain. °· Attend physical therapy sessions as directed by your caregiver. °· Keep all follow-up appointments as directed by your caregiver. °· Do not wear high heels or shoes that do not provide proper support. °· Check your mattress to see if it is too soft. A firm mattress may lessen your pain and discomfort. °SEEK IMMEDIATE MEDICAL CARE IF:  °· You lose control of your bowel or bladder (incontinence). °· You have increasing weakness in the lower back,   pelvis, buttocks, or legs. °· You have redness or swelling of your back. °· You have a burning sensation when you urinate. °· You have pain that gets worse when you lie down or awakens you at night. °· Your pain is worse than you have experienced in the past. °· Your pain is lasting longer than 4 weeks. °· You are suddenly losing weight without reason. °MAKE SURE YOU: °· Understand these  instructions. °· Will watch your condition. °· Will get help right away if you are not doing well or get worse. °Document Released: 04/25/2001 Document Revised: 10/31/2011 Document Reviewed: 09/10/2011 °Exit[employer]® Patient Information ©2014 [employer]. ° °

## 2013-01-01 NOTE — Progress Notes (Signed)
Went to [facility] on 12/27/12, thought she was having contraction. Was told she was dehydrate. C/O Having rt. Leg pain, worse when lay down.  Exercises given..FOB requests HSV 2 test.  F/U 4 weeks for PN2/LROB

## 2013-01-06 ENCOUNTER — Telehealth: Payer: Self-pay | Admitting: Women's Health

## 2013-01-06 NOTE — Telephone Encounter (Signed)
Spoke with pt. Yesterday, ankles were swollen and turned blue. Pt elevated ankles, and swelling went down some and blue color went away. I advised to increase fluids and elevate as much as possible. Pt don't have a lot of salt in diet. Advised swelling can be normal during pregnancy. Advised to call if blue color came back. Pt voiced understanding. JSY

## 2013-01-27 ENCOUNTER — Ambulatory Visit (HOSPITAL_COMMUNITY): Payer: Medicaid Other

## 2013-01-27 ENCOUNTER — Encounter (HOSPITAL_COMMUNITY)
Admission: EM | Disposition: A | Discharge status: Home / Self Care | Payer: Self-pay | Source: Home / Self Care | Attending: Obstetrics and Gynecology

## 2013-01-27 ENCOUNTER — Inpatient Hospital Stay (HOSPITAL_COMMUNITY): Payer: Medicaid Other

## 2013-01-27 ENCOUNTER — Inpatient Hospital Stay (HOSPITAL_COMMUNITY): Payer: Medicaid Other | Admitting: Anesthesiology

## 2013-01-27 ENCOUNTER — Encounter (HOSPITAL_COMMUNITY): Payer: Self-pay | Admitting: Anesthesiology

## 2013-01-27 ENCOUNTER — Encounter (HOSPITAL_COMMUNITY): Payer: Self-pay | Admitting: Emergency Medicine

## 2013-01-27 ENCOUNTER — Inpatient Hospital Stay (HOSPITAL_COMMUNITY)
Admission: EM | Admit: 2013-01-27 | Discharge: 2013-01-30 | DRG: 765 | Disposition: A | Discharge status: Home / Self Care | Payer: Medicaid Other | Attending: Obstetrics and Gynecology | Admitting: Obstetrics and Gynecology

## 2013-01-27 DIAGNOSIS — D696 Thrombocytopenia, unspecified: Secondary | ICD-10-CM | POA: Diagnosis not present

## 2013-01-27 DIAGNOSIS — O1414 Severe pre-eclampsia complicating childbirth: Principal | ICD-10-CM | POA: Diagnosis present

## 2013-01-27 DIAGNOSIS — O1413 Severe pre-eclampsia, third trimester: Secondary | ICD-10-CM

## 2013-01-27 DIAGNOSIS — O9913 Other diseases of the blood and blood-forming organs and certain disorders involving the immune mechanism complicating the puerperium: Secondary | ICD-10-CM

## 2013-01-27 DIAGNOSIS — D689 Coagulation defect, unspecified: Secondary | ICD-10-CM | POA: Diagnosis not present

## 2013-01-27 DIAGNOSIS — Z3483 Encounter for supervision of other normal pregnancy, third trimester: Secondary | ICD-10-CM

## 2013-01-27 DIAGNOSIS — O365990 Maternal care for other known or suspected poor fetal growth, unspecified trimester, not applicable or unspecified: Secondary | ICD-10-CM | POA: Diagnosis present

## 2013-01-27 DIAGNOSIS — R109 Unspecified abdominal pain: Secondary | ICD-10-CM

## 2013-01-27 LAB — CBC WITH DIFFERENTIAL/PLATELET
Eosinophils Relative: 1 % (ref 0–5)
Lymphocytes Relative: 31 % (ref 12–46)
Lymphs Abs: 5.5 10*3/uL — ABNORMAL HIGH (ref 0.7–4.0)
MCV: 88.4 fL (ref 78.0–100.0)
Neutro Abs: 10.7 10*3/uL — ABNORMAL HIGH (ref 1.7–7.7)
Platelets: 214 10*3/uL (ref 150–400)
RBC: 4.64 MIL/uL (ref 3.87–5.11)
WBC: 17.7 10*3/uL — ABNORMAL HIGH (ref 4.0–10.5)

## 2013-01-27 LAB — CBC
HCT: 37.6 % (ref 36.0–46.0)
HCT: 31.5 % — ABNORMAL LOW (ref 36.0–46.0)
Hemoglobin: 13.1 g/dL (ref 12.0–15.0)
Hemoglobin: 11 g/dL — ABNORMAL LOW (ref 12.0–15.0)
MCH: 29.7 pg (ref 26.0–34.0)
MCH: 29.7 pg (ref 26.0–34.0)
MCHC: 34.8 g/dL (ref 30.0–36.0)
MCHC: 34.9 g/dL (ref 30.0–36.0)
MCV: 85.3 fL (ref 78.0–100.0)
MCV: 85.1 fL (ref 78.0–100.0)

## 2013-01-27 LAB — COMPREHENSIVE METABOLIC PANEL
ALT: 46 U/L — ABNORMAL HIGH (ref 0–35)
Alkaline Phosphatase: 111 U/L (ref 39–117)
Alkaline Phosphatase: 145 U/L — ABNORMAL HIGH (ref 39–117)
Alkaline Phosphatase: 102 U/L (ref 39–117)
BUN: 19 mg/dL (ref 6–23)
BUN: 23 mg/dL (ref 6–23)
CO2: 23 mEq/L (ref 19–32)
Calcium: 8.2 mg/dL — ABNORMAL LOW (ref 8.4–10.5)
Chloride: 102 mEq/L (ref 96–112)
Creatinine, Ser: 0.75 mg/dL (ref 0.50–1.10)
GFR calc Af Amer: >90 mL/min (ref >90)
GFR calc Af Amer: >90 mL/min (ref >90)
GFR calc Af Amer: >90 mL/min (ref >90)
GFR calc non Af Amer: >90 mL/min (ref >90)
Glucose, Bld: 93 mg/dL (ref 70–99)
Glucose, Bld: 128 mg/dL — ABNORMAL HIGH (ref 70–99)
Glucose, Bld: 155 mg/dL — ABNORMAL HIGH (ref 70–99)
Potassium: 3.7 mEq/L (ref 3.5–5.1)
Potassium: 4.1 mEq/L (ref 3.5–5.1)
Potassium: 4.8 mEq/L (ref 3.5–5.1)
Sodium: 137 mEq/L (ref 135–145)
Total Bilirubin: 0.2 mg/dL — ABNORMAL LOW (ref 0.3–1.2)
Total Protein: 5.6 g/dL — ABNORMAL LOW (ref 6.0–8.3)
Total Protein: 5.1 g/dL — ABNORMAL LOW (ref 6.0–8.3)

## 2013-01-27 LAB — TYPE AND SCREEN
ABO/RH(D): O POS
Antibody Screen: NEGATIVE

## 2013-01-27 LAB — PROTEIN / CREATININE RATIO, URINE: Total Protein, Urine: 2078.5 mg/dL

## 2013-01-27 LAB — URINALYSIS, ROUTINE W REFLEX MICROSCOPIC
Bilirubin Urine: NEGATIVE
Glucose, UA: NEGATIVE mg/dL
Ketones, ur: NEGATIVE mg/dL
Specific Gravity, Urine: 1.025 (ref 1.005–1.030)
pH: 7 (ref 5.0–8.0)

## 2013-01-27 LAB — URINE MICROSCOPIC-ADD ON

## 2013-01-27 LAB — RAPID URINE DRUG SCREEN, HOSP PERFORMED
Barbiturates: POSITIVE — AB
Benzodiazepines: NOT DETECTED

## 2013-01-27 LAB — ABO/RH: ABO/RH(D): O POS

## 2013-01-27 SURGERY — Surgical Case
Anesthesia: Spinal | Site: Abdomen | Wound class: Clean Contaminated

## 2013-01-27 MED ORDER — CALCIUM CARBONATE ANTACID 500 MG PO CHEW: 2.0000 | CHEWABLE_TABLET | ORAL | Status: DC | PRN

## 2013-01-27 MED ORDER — MAGNESIUM SULFATE 40 G IN LACTATED RINGERS - SIMPLE
2.0000 g/h | Freq: Once | INTRAVENOUS | Status: DC
  Administered 2013-01-27: 2 g/h via INTRAVENOUS
  Filled 2013-01-27 (×2): qty 500

## 2013-01-27 MED ORDER — MORPHINE SULFATE 0.5 MG/ML IJ SOLN
INTRAMUSCULAR | Status: AC
  Filled 2013-01-27: qty 10

## 2013-01-27 MED ORDER — LABETALOL HCL 5 MG/ML IV SOLN
20.0000 mg | Freq: Once | INTRAVENOUS | Status: AC
  Administered 2013-01-27: 20 mg via INTRAVENOUS
  Filled 2013-01-27: qty 4

## 2013-01-27 MED ORDER — SENNOSIDES-DOCUSATE SODIUM 8.6-50 MG PO TABS
2.0000 | ORAL_TABLET | ORAL | Status: DC
  Administered 2013-01-28 – 2013-01-29 (×3): 2 via ORAL

## 2013-01-27 MED ORDER — MAGNESIUM SULFATE BOLUS VIA INFUSION
4.0000 g | Freq: Once | INTRAVENOUS | Status: AC
  Administered 2013-01-27: 4 g via INTRAVENOUS
  Filled 2013-01-27: qty 500

## 2013-01-27 MED ORDER — FAMOTIDINE IN NACL 20-0.9 MG/50ML-% IV SOLN
20.0000 mg | Freq: Two times a day (BID) | INTRAVENOUS | Status: DC
  Administered 2013-01-27: 20 mg via INTRAVENOUS
  Filled 2013-01-27 (×2): qty 50

## 2013-01-27 MED ORDER — OXYTOCIN 10 UNIT/ML IJ SOLN
40.0000 [IU] | INTRAVENOUS | Status: DC | PRN
  Administered 2013-01-27: 40 [IU] via INTRAVENOUS

## 2013-01-27 MED ORDER — OXYCODONE-ACETAMINOPHEN 5-325 MG PO TABS
1.0000 | ORAL_TABLET | ORAL | Status: DC | PRN
  Administered 2013-01-27: 1 via ORAL
  Administered 2013-01-28 – 2013-01-30 (×7): 2 via ORAL
  Filled 2013-01-27 (×3): qty 2
  Filled 2013-01-27: qty 1
  Filled 2013-01-27 (×4): qty 2

## 2013-01-27 MED ORDER — HYDRALAZINE HCL 20 MG/ML IJ SOLN
INTRAMUSCULAR | Status: AC
  Filled 2013-01-27: qty 1

## 2013-01-27 MED ORDER — MIDAZOLAM HCL 2 MG/2ML IJ SOLN: 0.5000 mg | Freq: Once | INTRAMUSCULAR | Status: DC | PRN

## 2013-01-27 MED ORDER — ONDANSETRON HCL 4 MG/2ML IJ SOLN
INTRAMUSCULAR | Status: DC | PRN
  Administered 2013-01-27: 4 mg via INTRAVENOUS

## 2013-01-27 MED ORDER — WITCH HAZEL-GLYCERIN EX PADS: 1.0000 "application " | MEDICATED_PAD | CUTANEOUS | Status: DC | PRN

## 2013-01-27 MED ORDER — HYDRALAZINE HCL 20 MG/ML IJ SOLN
INTRAMUSCULAR | Status: AC
  Administered 2013-01-27: 10 mg via INTRAVENOUS
  Filled 2013-01-27: qty 1

## 2013-01-27 MED ORDER — ONDANSETRON HCL 4 MG/2ML IJ SOLN
4.0000 mg | Freq: Once | INTRAMUSCULAR | Status: AC
  Administered 2013-01-27: 4 mg via INTRAVENOUS

## 2013-01-27 MED ORDER — SIMETHICONE 80 MG PO CHEW: 80.0000 mg | CHEWABLE_TABLET | ORAL | Status: DC | PRN

## 2013-01-27 MED ORDER — FENTANYL CITRATE 0.05 MG/ML IJ SOLN: 25.0000 ug | INTRAMUSCULAR | Status: DC | PRN

## 2013-01-27 MED ORDER — LABETALOL HCL 5 MG/ML IV SOLN
40.0000 mg | Freq: Once | INTRAVENOUS | Status: AC
  Administered 2013-01-27: 40 mg via INTRAVENOUS
  Filled 2013-01-27: qty 8

## 2013-01-27 MED ORDER — MORPHINE SULFATE 4 MG/ML IJ SOLN
2.0000 mg | Freq: Once | INTRAMUSCULAR | Status: AC
  Administered 2013-01-27: 2 mg via INTRAVENOUS
  Filled 2013-01-27: qty 1

## 2013-01-27 MED ORDER — ONDANSETRON HCL 4 MG/2ML IJ SOLN: 4.0000 mg | Freq: Three times a day (TID) | INTRAMUSCULAR | Status: DC | PRN

## 2013-01-27 MED ORDER — DIPHENHYDRAMINE HCL 25 MG PO CAPS: 25.0000 mg | ORAL_CAPSULE | Freq: Four times a day (QID) | ORAL | Status: DC | PRN

## 2013-01-27 MED ORDER — DIPHENHYDRAMINE HCL 50 MG/ML IJ SOLN: 25.0000 mg | INTRAMUSCULAR | Status: DC | PRN

## 2013-01-27 MED ORDER — SODIUM CHLORIDE 0.9 % IJ SOLN: 3.0000 mL | INTRAMUSCULAR | Status: DC | PRN

## 2013-01-27 MED ORDER — LACTATED RINGERS IV SOLN: INTRAVENOUS | Status: DC

## 2013-01-27 MED ORDER — MAGNESIUM SULFATE 40 MG/ML IJ SOLN
2.0000 g | INTRAMUSCULAR | Status: AC
  Administered 2013-01-27: 2 g via INTRAVENOUS

## 2013-01-27 MED ORDER — SCOPOLAMINE 1 MG/3DAYS TD PT72
1.0000 | MEDICATED_PATCH | Freq: Once | TRANSDERMAL | Status: AC
  Administered 2013-01-27: 1.5 mg via TRANSDERMAL

## 2013-01-27 MED ORDER — BETAMETHASONE SOD PHOS & ACET 6 (3-3) MG/ML IJ SUSP
12.0000 mg | INTRAMUSCULAR | Status: DC
  Administered 2013-01-27: 12 mg via INTRAMUSCULAR
  Filled 2013-01-27 (×2): qty 2

## 2013-01-27 MED ORDER — MORPHINE SULFATE (PF) 0.5 MG/ML IJ SOLN
INTRAMUSCULAR | Status: DC | PRN
  Administered 2013-01-27: .15 mg via INTRATHECAL

## 2013-01-27 MED ORDER — PHENYLEPHRINE HCL 10 MG/ML IJ SOLN
INTRAMUSCULAR | Status: DC | PRN
  Administered 2013-01-27 (×2): 40 ug via INTRAVENOUS
  Administered 2013-01-27: 80 ug via INTRAVENOUS
  Administered 2013-01-27: 40 ug via INTRAVENOUS
  Administered 2013-01-27 (×2): 80 ug via INTRAVENOUS
  Administered 2013-01-27 (×2): 40 ug via INTRAVENOUS

## 2013-01-27 MED ORDER — NALOXONE HCL 1 MG/ML IJ SOLN: 1.0000 ug/kg/h | INTRAVENOUS | Status: DC | PRN

## 2013-01-27 MED ORDER — GI COCKTAIL ~~LOC~~
30.0000 mL | Freq: Once | ORAL | Status: AC
  Administered 2013-01-27: 30 mL via ORAL
  Filled 2013-01-27: qty 30

## 2013-01-27 MED ORDER — ONDANSETRON HCL 4 MG/2ML IJ SOLN
INTRAMUSCULAR | Status: AC
  Administered 2013-01-27: 4 mg
  Filled 2013-01-27: qty 2

## 2013-01-27 MED ORDER — LACTATED RINGERS IV SOLN
INTRAVENOUS | Status: DC
  Administered 2013-01-27 – 2013-01-28 (×2): via INTRAVENOUS

## 2013-01-27 MED ORDER — PHENYLEPHRINE 40 MCG/ML (10ML) SYRINGE FOR IV PUSH (FOR BLOOD PRESSURE SUPPORT)
PREFILLED_SYRINGE | INTRAVENOUS | Status: AC
  Filled 2013-01-27: qty 20

## 2013-01-27 MED ORDER — MEPERIDINE HCL 25 MG/ML IJ SOLN
6.2500 mg | INTRAMUSCULAR | Status: DC | PRN
  Administered 2013-01-27: 12.5 mg via INTRAVENOUS

## 2013-01-27 MED ORDER — TETANUS-DIPHTH-ACELL PERTUSSIS 5-2.5-18.5 LF-MCG/0.5 IM SUSP
0.5000 mL | Freq: Once | INTRAMUSCULAR | Status: AC
  Administered 2013-01-28: 0.5 mL via INTRAMUSCULAR
  Filled 2013-01-27: qty 0.5

## 2013-01-27 MED ORDER — HYDRALAZINE HCL 20 MG/ML IJ SOLN
10.0000 mg | Freq: Once | INTRAMUSCULAR | Status: AC
  Administered 2013-01-27: 10 mg via INTRAVENOUS

## 2013-01-27 MED ORDER — PROMETHAZINE HCL 25 MG/ML IJ SOLN: 6.2500 mg | INTRAMUSCULAR | Status: DC | PRN

## 2013-01-27 MED ORDER — LABETALOL HCL 5 MG/ML IV SOLN
20.0000 mg | INTRAVENOUS | Status: DC | PRN
Start: 2013-01-27 — End: 2013-01-30
  Administered 2013-01-27: 20 mg via INTRAVENOUS
  Filled 2013-01-27: qty 4

## 2013-01-27 MED ORDER — MAGNESIUM SULFATE 40 MG/ML IJ SOLN
2.0000 g | INTRAMUSCULAR | Status: AC
  Administered 2013-01-27: 2 g via INTRAVENOUS
  Filled 2013-01-27 (×2): qty 50

## 2013-01-27 MED ORDER — SIMETHICONE 80 MG PO CHEW
80.0000 mg | CHEWABLE_TABLET | ORAL | Status: DC
  Administered 2013-01-28 – 2013-01-29 (×2): 80 mg via ORAL

## 2013-01-27 MED ORDER — LANOLIN HYDROUS EX OINT: 1.0000 "application " | TOPICAL_OINTMENT | CUTANEOUS | Status: DC | PRN

## 2013-01-27 MED ORDER — NALOXONE HCL 0.4 MG/ML IJ SOLN: 0.4000 mg | INTRAMUSCULAR | Status: DC | PRN

## 2013-01-27 MED ORDER — MEPERIDINE HCL 25 MG/ML IJ SOLN: 6.2500 mg | INTRAMUSCULAR | Status: DC | PRN

## 2013-01-27 MED ORDER — LABETALOL HCL 200 MG PO TABS
200.0000 mg | ORAL_TABLET | Freq: Two times a day (BID) | ORAL | Status: DC
  Filled 2013-01-27 (×3): qty 1

## 2013-01-27 MED ORDER — BUPIVACAINE IN DEXTROSE 0.75-8.25 % IT SOLN
INTRATHECAL | Status: DC | PRN
  Administered 2013-01-27: 1.5 mL via INTRATHECAL

## 2013-01-27 MED ORDER — DIPHENHYDRAMINE HCL 50 MG/ML IJ SOLN: 12.5000 mg | INTRAMUSCULAR | Status: DC | PRN

## 2013-01-27 MED ORDER — MEPERIDINE HCL 25 MG/ML IJ SOLN
INTRAMUSCULAR | Status: AC
  Administered 2013-01-27: 12.5 mg via INTRAVENOUS
  Filled 2013-01-27: qty 1

## 2013-01-27 MED ORDER — MAGNESIUM SULFATE 40 G IN LACTATED RINGERS - SIMPLE
2.0000 g/h | INTRAVENOUS | Status: AC
  Administered 2013-01-28: 2 g/h via INTRAVENOUS
  Filled 2013-01-27 (×2): qty 500

## 2013-01-27 MED ORDER — ONDANSETRON HCL 4 MG PO TABS
4.0000 mg | ORAL_TABLET | ORAL | Status: DC | PRN
  Administered 2013-01-28 – 2013-01-30 (×3): 4 mg via ORAL
  Filled 2013-01-27 (×3): qty 1

## 2013-01-27 MED ORDER — NALBUPHINE HCL 10 MG/ML IJ SOLN: 5.0000 mg | INTRAMUSCULAR | Status: DC | PRN

## 2013-01-27 MED ORDER — FENTANYL CITRATE 0.05 MG/ML IJ SOLN
INTRAMUSCULAR | Status: AC
  Filled 2013-01-27: qty 2

## 2013-01-27 MED ORDER — NALBUPHINE HCL 10 MG/ML IJ SOLN
5.0000 mg | INTRAMUSCULAR | Status: DC | PRN
Start: 2013-01-27 — End: 2013-01-30

## 2013-01-27 MED ORDER — DIPHENHYDRAMINE HCL 25 MG PO CAPS: 25.0000 mg | ORAL_CAPSULE | ORAL | Status: DC | PRN

## 2013-01-27 MED ORDER — GENTAMICIN SULFATE 40 MG/ML IJ SOLN
INTRAMUSCULAR | Status: AC
  Administered 2013-01-27: 100 mL via INTRAVENOUS
  Filled 2013-01-27: qty 8.56

## 2013-01-27 MED ORDER — SIMETHICONE 80 MG PO CHEW
80.0000 mg | CHEWABLE_TABLET | Freq: Three times a day (TID) | ORAL | Status: DC
  Administered 2013-01-27 – 2013-01-30 (×8): 80 mg via ORAL

## 2013-01-27 MED ORDER — SCOPOLAMINE 1 MG/3DAYS TD PT72
MEDICATED_PATCH | TRANSDERMAL | Status: AC
  Administered 2013-01-27: 1.5 mg via TRANSDERMAL
  Filled 2013-01-27: qty 1

## 2013-01-27 MED ORDER — ONDANSETRON HCL 4 MG/2ML IJ SOLN
INTRAMUSCULAR | Status: AC
  Filled 2013-01-27: qty 2

## 2013-01-27 MED ORDER — ONDANSETRON HCL 4 MG/2ML IJ SOLN
4.0000 mg | INTRAMUSCULAR | Status: DC | PRN
  Administered 2013-01-27: 4 mg via INTRAVENOUS
  Filled 2013-01-27: qty 2

## 2013-01-27 MED ORDER — FENTANYL CITRATE 0.05 MG/ML IJ SOLN
INTRAMUSCULAR | Status: DC | PRN
  Administered 2013-01-27: 25 ug via INTRATHECAL

## 2013-01-27 MED ORDER — OXYTOCIN 40 UNITS IN LACTATED RINGERS INFUSION - SIMPLE MED: 62.5000 mL/h | INTRAVENOUS | Status: AC

## 2013-01-27 MED ORDER — IBUPROFEN 600 MG PO TABS
600.0000 mg | ORAL_TABLET | Freq: Four times a day (QID) | ORAL | Status: DC
  Administered 2013-01-27 – 2013-01-30 (×8): 600 mg via ORAL
  Filled 2013-01-27 (×8): qty 1

## 2013-01-27 MED ORDER — DIBUCAINE 1 % RE OINT: 1.0000 "application " | TOPICAL_OINTMENT | RECTAL | Status: DC | PRN

## 2013-01-27 MED ORDER — PRENATAL MULTIVITAMIN CH: 1.0000 | ORAL_TABLET | Freq: Every day | ORAL | Status: DC

## 2013-01-27 MED ORDER — GI COCKTAIL ~~LOC~~: 30.0000 mL | Freq: Once | ORAL | Status: DC

## 2013-01-27 MED ORDER — MENTHOL 3 MG MT LOZG: 1.0000 | LOZENGE | OROMUCOSAL | Status: DC | PRN

## 2013-01-27 MED ORDER — OXYTOCIN 10 UNIT/ML IJ SOLN
INTRAMUSCULAR | Status: AC
  Filled 2013-01-27: qty 4

## 2013-01-27 MED ORDER — DOCUSATE SODIUM 100 MG PO CAPS: 100.0000 mg | ORAL_CAPSULE | Freq: Every day | ORAL | Status: DC

## 2013-01-27 MED ORDER — LABETALOL HCL 5 MG/ML IV SOLN
10.0000 mg | Freq: Once | INTRAVENOUS | Status: AC
  Administered 2013-01-27: 10 mg via INTRAVENOUS
  Filled 2013-01-27: qty 4

## 2013-01-27 MED ORDER — PRENATAL MULTIVITAMIN CH
1.0000 | ORAL_TABLET | Freq: Every day | ORAL | Status: DC
  Administered 2013-01-28 – 2013-01-30 (×3): 1 via ORAL
  Filled 2013-01-27 (×3): qty 1

## 2013-01-27 MED ORDER — METOCLOPRAMIDE HCL 5 MG/ML IJ SOLN: 10.0000 mg | Freq: Three times a day (TID) | INTRAMUSCULAR | Status: DC | PRN

## 2013-01-27 SURGICAL SUPPLY — 34 items
BRR ADH 6X5 SEPRAFILM 1 SHT (MISCELLANEOUS)
CLAMP CORD UMBIL (MISCELLANEOUS) IMPLANT
CLEANER TIP ELECTROSURG 2X2 (MISCELLANEOUS) ×2 IMPLANT
CONTAINER PREFILL 10% NBF 15ML (MISCELLANEOUS) IMPLANT
DEVICE BLD TRNS LUER ATTCH (MISCELLANEOUS) ×2 IMPLANT
DRAPE LG THREE QUARTER DISP (DRAPES) ×4 IMPLANT
DRSG OPSITE POSTOP 4X10 (GAUZE/BANDAGES/DRESSINGS) ×2 IMPLANT
DURAPREP 26ML APPLICATOR (WOUND CARE) ×2 IMPLANT
ELECT REM PT RETURN 9FT ADLT (ELECTROSURGICAL) ×2
ELECTRODE REM PT RTRN 9FT ADLT (ELECTROSURGICAL) ×1 IMPLANT
EXTRACTOR VACUUM M CUP 4 TUBE (SUCTIONS) IMPLANT
GAUZE SPONGE 4X4 12PLY STRL LF (GAUZE/BANDAGES/DRESSINGS) ×1 IMPLANT
GLOVE BIOGEL PI IND STRL 6.5 (GLOVE) ×1 IMPLANT
GLOVE BIOGEL PI INDICATOR 6.5 (GLOVE) ×1
GLOVE SURG SS PI 6.0 STRL IVOR (GLOVE) ×2 IMPLANT
GOWN PREVENTION PLUS XLARGE (GOWN DISPOSABLE) ×4 IMPLANT
GOWN STRL REIN XL XLG (GOWN DISPOSABLE) ×4 IMPLANT
KIT ABG SYR 3ML LUER SLIP (SYRINGE) ×1 IMPLANT
NDL HYPO 25X5/8 SAFETYGLIDE (NEEDLE) IMPLANT
NEEDLE HYPO 25X5/8 SAFETYGLIDE (NEEDLE) IMPLANT
NS IRRIG 1000ML POUR BTL (IV SOLUTION) ×2 IMPLANT
PACK C SECTION WH (CUSTOM PROCEDURE TRAY) ×2 IMPLANT
PAD ABD 7.5X8 STRL (GAUZE/BANDAGES/DRESSINGS) ×1 IMPLANT
PAD OB MATERNITY 4.3X12.25 (PERSONAL CARE ITEMS) ×2 IMPLANT
PENCIL BUTTON HOLSTER BLD 10FT (ELECTRODE) ×2 IMPLANT
RTRCTR C-SECT PINK 25CM LRG (MISCELLANEOUS) ×1 IMPLANT
SEPRAFILM MEMBRANE 5X6 (MISCELLANEOUS) IMPLANT
STAPLER VISISTAT 35W (STAPLE) IMPLANT
SUT PLAIN 0 NONE (SUTURE) IMPLANT
SUT VIC AB 0 CT1 36 (SUTURE) ×8 IMPLANT
SUT VIC AB 4-0 KS 27 (SUTURE) ×2 IMPLANT
TOWEL OR 17X24 6PK STRL BLUE (TOWEL DISPOSABLE) ×2 IMPLANT
TRAY FOLEY CATH 14FR (SET/KITS/TRAYS/PACK) ×2 IMPLANT
WATER STERILE IRR 1000ML POUR (IV SOLUTION) ×2 IMPLANT

## 2013-01-27 NOTE — ED Notes (Signed)
Sharen Counter Nurse Midwife given report

## 2013-01-27 NOTE — ED Notes (Signed)
Report given Shelly with [facility]

## 2013-01-27 NOTE — Progress Notes (Signed)
[facility] ED RN called regarding pt at 27 week with c/o right upper quadrant pain and n/v. Surveillance started

## 2013-01-27 NOTE — ED Notes (Signed)
Notified Nichole, rapid response RN at [facility] of pt.

## 2013-01-27 NOTE — Progress Notes (Signed)
MD at bedside discussing POC to do primary c/s now due to lab results.

## 2013-01-27 NOTE — ED Notes (Signed)
Resting at this time with significant other at bsd. Pain has decreased at present moment to upper abd per pt.

## 2013-01-27 NOTE — Progress Notes (Signed)
Bedpan to pt, unable to void

## 2013-01-27 NOTE — H&P (Signed)
Haley [name] is a 22 y.o. female G2P1001 at [redacted]w[redacted]d transferred from [facility] for evaluation of severe preeclampsia. Patient presented to [facility] this morning with a complaint of severe epigastric pain with some nausea. She denies headaches, visual disturbances, LOF or VB. She reports good fetal movement. Patient reports uncomplicated prenatal care with [facility]. She had a previous vaginal delivery in 2009. She states she had elevated BP in labor but never required magnesium sulfate or antihypertensive medications. Other than her severe epigastric pain, patient is without any complaints.  History OB History   Grav Para Term Preterm Abortions TAB SAB Ect Mult Living   2 1 1       1      Past Medical History  Diagnosis Date  . Heart murmur   . Hx of chlamydia infection   . Abnormal Pap smear   . HSV-2 (herpes simplex virus 2) infection   . Pregnant    Past Surgical History  Procedure Laterality Date  . Wisdom tooth extraction  2012   Family History: family history includes Anxiety disorder in her mother; Cancer in her paternal grandmother; Heart disease in her mother; Stroke in her paternal aunt. Social History:  reports that she quit smoking about 2 years ago. Her smoking use included Cigarettes. She smoked 0.00 packs per day. She has never used smokeless tobacco. She reports that she does not drink alcohol or use illicit drugs.   Prenatal Transfer Tool  Maternal Diabetes: not yet teested Genetic Screening: Abnormal:  Results: Elevated risk of Trisomy 21, Other: harmony pending Maternal Ultrasounds/Referrals: Normal Fetal Ultrasounds or other Referrals:  None Maternal Substance Abuse:  No Significant Maternal Medications:  None Significant Maternal Lab Results:  None Other Comments:  severe preeclampsia at 27 weeks  Review of Systems  All other systems reviewed and are negative.      Blood pressure 182/108, pulse 89, temperature 98.3 F (36.8 C), temperature source  Oral, resp. rate 16, height 5\' 1"  (1.549 m), weight 151 lb (68.493 kg), last menstrual period 07/21/2012, SpO2 98.00%. Exam Physical Exam  GENERAL: Well-developed, well-nourished female, visibly in pain.  HEENT: Normocephalic, atraumatic. Sclerae anicteric.  NECK: Supple. Normal thyroid.  LUNGS: Clear to auscultation bilaterally.  HEART: Regular rate and rhythm. ABDOMEN: Soft, gravid, epigastric pain PELVIC: Normal external female genitalia. No herpetic lesions visualized. Cervix closed, long, posterior EXTREMITIES: No cyanosis, clubbing, or edema, hyperreflexia with 2 beats of clonus on right.  FHT: baseline 130, min variability, no accels, no decels Toco: no contractions  Prenatal labs: ABO, Rh: O/POS/-- (05/06 1440) Antibody: NEG (05/06 1440) Rubella: 1.02 (05/06 1440) RPR: NON REAC (05/06 1440)  HBsAg: NEGATIVE (05/06 1440)  HIV: NON REACTIVE (05/06 1440)  GBS:     Assessment/Plan: 22 yo G2P1001 at [redacted]w[redacted]d with severe preeclampsia - Betamethasone to be administered - magnesium sulfate for seizure prophylaxis - consult to MFM for delivery planning  - Start collection for 24 hour urine protein, in the meantime will collected urine protein:creatine ratio - GBS collected - Continue BP control with antihypertensive - Close monitoring    Haley [name] 01/27/2013, 11:09 AM

## 2013-01-27 NOTE — ED Notes (Signed)
Pt refusing foley at this time.

## 2013-01-27 NOTE — Progress Notes (Signed)
ED Called to instructed importance of fetal heart rate tracing. RN stated they are trying to find adequate placing for cardio.

## 2013-01-27 NOTE — Anesthesia Preprocedure Evaluation (Signed)
Anesthesia Evaluation  Patient identified by MRN, date of birth, ID band Patient awake    Reviewed: Allergy & Precautions, H&P , NPO status , Patient's Chart, lab work & pertinent test results  Airway Mallampati: II      Dental no notable dental hx.    Pulmonary neg pulmonary ROS,  breath sounds clear to auscultation  Pulmonary exam normal       Cardiovascular Exercise Tolerance: Good hypertension, negative cardio ROS  Rhythm:regular Rate:Normal     Neuro/Psych negative neurological ROS  negative psych ROS   GI/Hepatic negative GI ROS, Neg liver ROS,   Endo/Other  negative endocrine ROS  Renal/GU negative Renal ROS  negative genitourinary   Musculoskeletal   Abdominal Normal abdominal exam  (+)   Peds  Hematology negative hematology ROS (+)   Anesthesia Other Findings Severe pre-eclampsia Low platelets  Reproductive/Obstetrics (+) Pregnancy                           Anesthesia Physical Anesthesia Plan  ASA: III and emergent  Anesthesia Plan: Spinal   Post-op Pain Management:    Induction:   Airway Management Planned:   Additional Equipment:   Intra-op Plan:   Post-operative Plan:   Informed Consent: I have reviewed the patients History and Physical, chart, labs and discussed the procedure including the risks, benefits and alternatives for the proposed anesthesia with the patient or authorized representative who has indicated his/her understanding and acceptance.     Plan Discussed with: Anesthesiologist, CRNA and Surgeon  Anesthesia Plan Comments:         Anesthesia Quick Evaluation

## 2013-01-27 NOTE — ED Notes (Signed)
Pt still c/o severe upper abd pain.

## 2013-01-27 NOTE — Progress Notes (Signed)
Called [facility] ED RN regarding attempting to trace FHR. Nurse stated they are continue to try.

## 2013-01-27 NOTE — Transfer of Care (Signed)
Immediate Anesthesia Transfer of Care Note  Patient: Haley [name]  Procedure(s) Performed: Procedure(s): CESAREAN SECTION (N/A)  Patient Location: PACU  Anesthesia Type:Spinal  Level of Consciousness: awake, alert  and oriented  Airway & Oxygen Therapy: Patient Spontanous Breathing  Post-op Assessment: Report given to PACU RN and Post -op Vital signs reviewed and stable  Post vital signs: stable  Complications: No apparent anesthesia complications

## 2013-01-27 NOTE — ED Notes (Signed)
Pt vomiting at this time. Fetal hr tracing at 130s on monitor. New orders received for pt bp

## 2013-01-27 NOTE — Progress Notes (Signed)
Pt off the monitor and U/S tech at bedside to perform ultrasound

## 2013-01-27 NOTE — Progress Notes (Signed)
Labetalol 20 mg IV given per orders

## 2013-01-27 NOTE — Progress Notes (Signed)
Md notified that pt was just getting to the room and in lots of epigastric pain.

## 2013-01-27 NOTE — ED Notes (Signed)
Dr. Hyacinth Meeker aware of pt bp. New order for hydralazine 10mg 

## 2013-01-27 NOTE — Progress Notes (Signed)
Receiving pt to 149 via [facility].  Pt in lots of pain and moving to bed with lots of assistance.  Report from [facility].

## 2013-01-27 NOTE — ED Provider Notes (Signed)
CSN: [id number]     Arrival date & time 01/27/13  0750 History  This chart was scribed for Vida Roller, MD by Quintella Reichert, ED scribe.  This patient was seen in room [facility] and the patient's care was started at 7:59 AM.   Chief Complaint  Patient presents with  . Abdominal Pain  . Emesis    The history is provided by the patient. No language interpreter was used.    HPI Comments: Haley [name] is a 27-weeks pregnant (G2P1A0) 22 y.o. female brought in by EMS to the Emergency Department complaining of acute onset last night of constant severe upper abdominal pain with associated severe HTN on arrival (230/130).  Pt describes pain as a severe stabbing pain that radiates into her back.  It has not stopped at all since onset.  She made herself threw up earlier thinking that it may help but this did not improve symptoms.  She denies diarrhea, dysuria,.vaginal bleeding, numbness, weakness, tingling of legs, blurred vision, or headache.  Since arrival pt's BP has ranged between 200/110 - 230/130.  Pt denies any previous complications of her current pregnancy to her knowledge.  She has had pregnancy confirmed by [place].  She has been pregnant 1 time before which was normal except that she developed HTN during delivery which she attributes at least in part to external life stressors.  Pt has no h/o gallbladder problems or pancreatitis.  She does not smoke or drink.  She does not take any medications regularly.  OB/GYN is Dr. Grace Bushy at [facility]   Past Medical History  Diagnosis Date  . Heart murmur   . Hx of chlamydia infection   . Abnormal Pap smear   . HSV-2 (herpes simplex virus 2) infection   . Pregnant     Past Surgical History  Procedure Laterality Date  . Wisdom tooth extraction  2012    Family History  Problem Relation Age of Onset  . Cancer Paternal Grandmother     breast  . Anxiety disorder Mother   . Heart disease Mother   . Stroke Paternal Aunt     History   Substance Use Topics  . Smoking status: Former Smoker    Types: Cigarettes    Quit date: 12/14/2010  . Smokeless tobacco: Never Used  . Alcohol Use: No    OB History   Grav Para Term Preterm Abortions TAB SAB Ect Mult Living   2 1 1       1       Review of Systems A complete 10 system review of systems was obtained and all systems are negative except as noted in the HPI and PMH.    Allergies  Penicillins  Home Medications   Current Outpatient Rx  Name  Route  Sig  Dispense  Refill  . acetaminophen (TYLENOL) 500 MG tablet   Oral   Take 500 mg by mouth every 6 (six) hours as needed for pain (headache).         . butalbital-acetaminophen-caffeine (FIORICET) 50-325-40 MG per tablet   Oral   Take 1-2 tablets by mouth every 6 (six) hours as needed for headache.   20 tablet   1   . ondansetron (ZOFRAN) 8 MG tablet   Oral   Take 1 tablet (8 mg total) by mouth every 8 (eight) hours as needed for nausea.   30 tablet   3   . Prenatal Vit-Fe Fumarate-FA (PRENATAL MULTIVITAMIN) TABS   Oral   Take  1 tablet by mouth at bedtime.           BP 230/127  Pulse 88  Temp(Src) 98.3 F (36.8 C) (Oral)  Resp 23  Ht 5\' 1"  (1.549 m)  Wt 151 lb (68.493 kg)  BMI 28.55 kg/m2  SpO2 98%  LMP 07/21/2012  Physical Exam  Nursing note and vitals reviewed. Constitutional: She appears well-developed and well-nourished. No distress.  HENT:  Head: Normocephalic and atraumatic.  Mouth/Throat: Oropharynx is clear and moist. No oropharyngeal exudate.  Eyes: Conjunctivae and EOM are normal. Pupils are equal, round, and reactive to light. Right eye exhibits no discharge. Left eye exhibits no discharge. No scleral icterus.  Neck: Normal range of motion. Neck supple. No JVD present. No thyromegaly present.  Cardiovascular: Normal rate, regular rhythm, normal heart sounds and intact distal pulses.  Exam reveals no gallop and no friction rub.   No murmur heard. Pulmonary/Chest: Effort normal  and breath sounds normal. No respiratory distress. She has no wheezes. She has no rales.  Abdominal: Soft. Bowel sounds are normal. She exhibits no distension and no mass. There is tenderness (epigastrium, moderate). There is no guarding.  No lower abdominal tenderness No peritoneal signs  Musculoskeletal: Normal range of motion. She exhibits no edema and no tenderness.  Lymphadenopathy:    She has no cervical adenopathy.  Neurological: She is alert. Coordination normal.  Skin: Skin is warm and dry. No rash noted. No erythema.  Psychiatric: She has a normal mood and affect. Her behavior is normal.    ED Course  Procedures (including critical care time)  DIAGNOSTIC STUDIES: Oxygen Saturation is 98% on room air, normal by my interpretation.    COORDINATION OF CARE: 8:09 AM: Discussed treatment plan which includes pain medications, GI cocktail, labetalol and magnesium sulfate injections, and labs.  Pt expressed understanding and agreed to plan.  8:58 AM: On recheck BP is still elevated.  Pt states her abdominal pain is the same as previously and she now additionally complains of a headache.  She denies blurred vision.   Labs Review Labs Reviewed  CBC WITH DIFFERENTIAL - Abnormal; Notable for the following:    WBC 17.7 (*)    Neutro Abs 10.7 (*)    Lymphs Abs 5.5 (*)    Monocytes Absolute 1.3 (*)    All other components within normal limits  COMPREHENSIVE METABOLIC PANEL - Abnormal; Notable for the following:    Albumin 2.4 (*)    AST 63 (*)    ALT 46 (*)    Total Bilirubin 0.2 (*)    All other components within normal limits  PROTIME-INR - Abnormal; Notable for the following:    Prothrombin Time 10.8 (*)    All other components within normal limits  APTT  URINALYSIS, ROUTINE W REFLEX MICROSCOPIC    Imaging Review No results found.  MDM   1. Severe preeclampsia, third trimester   2. Abdominal pain    The patient appears significantly ill with severe hypertension which  in this case could be severe preeclampsia. She has not had any seizures and has normal mental status at this time but she does have some epigastric tenderness this is not a surgical abdomen. Her laboratory workup shows elevated liver function test though they are only mildly elevated. She is not having hemolysis or low platelets. Urinalysis is pending at this time. She does have a headache but no changes in vision. I have discussed her care with the OB/GYN doctor constant, she has accepted care  of the patient to the [facility]. We will place the patient on a magnesium drip, she has been given 4 g of magnesium IV push, a total of 30 mg of labetalol IV push and 10 mg of hydralazine. Her blood pressure remains severely hypertensive. The patient is critically ill  Filed Vitals:   01/27/13 0807 01/27/13 0816 01/27/13 0825 01/27/13 0835  BP: 230/130 230/127 218/123 231/121  Pulse:   89 80  Temp:      TempSrc:      Resp:   22   Height: 5\' 1"  (1.549 m)     Weight: 151 lb (68.493 kg)     SpO2:   98%     CRITICAL CARE Performed by: Vida Roller Total critical care time: 35 Critical care time was exclusive of separately billable procedures and treating other patients. Critical care was necessary to treat or prevent imminent or life-threatening deterioration. Critical care was time spent personally by me on the following activities: development of treatment plan with patient and/or surrogate as well as nursing, discussions with consultants, evaluation of patient's response to treatment, examination of patient, obtaining history from patient or surrogate, ordering and performing treatments and interventions, ordering and review of laboratory studies, ordering and review of radiographic studies, pulse oximetry and re-evaluation of patient's condition.     I personally performed the services described in this documentation, which was scribed in my presence. The recorded information has been reviewed  and is accurate.      Vida Roller, MD 01/27/13 [phone]

## 2013-01-27 NOTE — Anesthesia Postprocedure Evaluation (Signed)
  Anesthesia Post-op Note  Anesthesia Post Note  Patient: Haley [name]  Procedure(s) Performed: Procedure(s) (LRB): CESAREAN SECTION (N/A)  Anesthesia type: Spinal  Patient location: PACU  Post pain: Pain level controlled  Post assessment: Post-op Vital signs reviewed  Last Vitals:  Filed Vitals:   01/27/13 1700  BP: 117/77  Pulse: 88  Temp:   Resp: 19    Post vital signs: Reviewed  Level of consciousness: awake  Complications: No apparent anesthesia complications

## 2013-01-27 NOTE — ED Notes (Signed)
Pt vomited x1.  

## 2013-01-27 NOTE — Progress Notes (Signed)
Updated from [facility] ED RN regarding pt being transferred to MAU per Dr. Jolayne Panther.

## 2013-01-27 NOTE — Anesthesia Procedure Notes (Signed)
Spinal  Patient location during procedure: OR Start time: 01/27/2013 2:52 PM Staffing Anesthesiologist: Angus Seller., Harrell Gave. Performed by: anesthesiologist  Preanesthetic Checklist Completed: patient identified, site marked, surgical consent, pre-op evaluation, timeout performed, IV checked, risks and benefits discussed and monitors and equipment checked Spinal Block Patient position: sitting Prep: DuraPrep Patient monitoring: heart rate, cardiac monitor, continuous pulse ox and blood pressure Approach: midline Location: L3-4 Injection technique: single-shot Needle Needle type: Sprotte  Needle gauge: 24 G Needle length: 9 cm Assessment Sensory level: T4 Additional Notes Patient identified.  Risk benefits discussed including failed block, incomplete pain control, headache, nerve damage, paralysis, blood pressure changes, nausea, vomiting, reactions to medication both toxic or allergic, and postpartum back pain.  Patient expressed understanding and wished to proceed.  All questions were answered.  Sterile technique used throughout procedure.  CSF was clear.  No parasthesia or other complications.  Please see nursing notes for vital signs.

## 2013-01-27 NOTE — Consult Note (Signed)
Neonatology Note:  Attendance at C-section:  I was asked by Dr. Constant to attend this urgent primary C/S at 27 3/7 weeks due to HELLP and reverse EDF on Doppler. The mother is a G2P1 O pos, GBS not done with severe PIH and a history of HSV. She presented this morning with abdominal pain and got Labetalol and magnesium sulfate. She also received 1 dose of Betamethasone about 3 hours before delivery. ROM at delivery, fluid clear. Infant had some movement and respiratory effort, and after bulb suctioning, she began to cry. We dried her and placed her into the portawarmer bag, then placed her on pulse oximetry. Her HR was normal throughout. We applied the neopuff due to decreased air movement, and O2 saturations came up into the 90s on 40-50% FIO2. I electively intubated her at 6 min of life because she was having significant subcostal retractions and I felt she could benefit from surfactant administration. I intubated her atraumatically on the first attempt with a 2.5 mm ETT to a depth of 6 3/4 cm. The CO2 detector turned yellow immediately and good breath sounds could be heard bilaterally. We gave 2.1 ml of surfactant via the ETT, which she tolerated well, without desaturation. We secured the ETT, showed her briefly to the mother, then transported her to the NICU for further care. Her father was in attendance. Ap 9/9.  Aslan Montagna C. Danasia Baker, MD  

## 2013-01-27 NOTE — Consult Note (Addendum)
Patient : Haley [name]; 22 y.o. MRN# [id number] [facility] Attending: Catalina Antigua, MD Consult Date: 01/27/2013 -  Consult for: severe preeclampsia  HPI: Haley [name] is a 22 y.o. G2P1001 at [redacted]w[redacted]d transferred from [facility] for evaluation of severe preeclampsia. Patient presented to [facility] this morning with a complaint of severe epigastric pain with some nausea. She denies headaches, visual disturbances, LOF or VB. She reports good fetal movement. Patient reports uncomplicated prenatal care with [facility]. She had a previous vaginal delivery in 2009. She states she had elevated BP in labor but never required magnesium sulfate or antihypertensive medications.  Upon my evaluation, the patient reports continued epigastric pain and onset of a headache.  She reports no change in her vision.   Allergy: Penicillins Patient denies food, latex or environmental allergies.  Current Medications: Current facility-administered medications:betamethasone acetate-betamethasone sodium phosphate (CELESTONE) injection 12 mg, 12 mg, Intramuscular, Q24H, Peggy Constant, MD, 12 mg at 01/27/13 1203;  calcium carbonate (TUMS - dosed in mg elemental calcium) chewable tablet 400 mg of elemental calcium, 2 tablet, Oral, Q4H PRN, Peggy Constant, MD;  docusate sodium (COLACE) capsule 100 mg, 100 mg, Oral, Daily, Peggy Constant, MD famotidine (PEPCID) IVPB 20 mg, 20 mg, Intravenous, Q12H, Peggy Constant, MD, 20 mg at 01/27/13 1238;  gi cocktail (Maalox,Lidocaine,Donnatal), 30 mL, Oral, Once, Vida Roller, MD;  labetalol (NORMODYNE,TRANDATE) injection 20 mg, 20 mg, Intravenous, Q10 min PRN, Peggy Constant, MD, 20 mg at 01/27/13 1143;  labetalol (NORMODYNE,TRANDATE) injection 40 mg, 40 mg, Intravenous, Once, Peggy Constant, MD lactated ringers infusion, , Intravenous, Continuous, Peggy Constant, MD, Last Rate: 100 mL/hr at 01/27/13 1105;  magnesium sulfate 40 grams in LR 500 mL OB infusion, 2  g/hr, Intravenous, Titrated, Peggy Constant, MD, Last Rate: 25 mL/hr at 01/27/13 1130, 2 g/hr at 01/27/13 1130;  prenatal multivitamin tablet 1 tablet, 1 tablet, Oral, Q1200, Catalina Antigua, MD   Past Medical History: Past Medical History  Diagnosis Date  . Heart murmur   . Hx of chlamydia infection   . Abnormal Pap smear   . HSV-2 (herpes simplex virus 2) infection   . Pregnant     Past Surgical History: Past Surgical History  Procedure Laterality Date  . Wisdom tooth extraction  2012     Past OB/GYN History: OB History   Grav Para Term Preterm Abortions TAB SAB Ect Mult Living   2 1 1       1       Social History: History   Social History  . Marital Status: Single    Spouse Name: N/A    Number of Children: N/A  . Years of Education: N/A   Social History Main Topics  . Smoking status: Former Smoker    Types: Cigarettes    Quit date: 12/14/2010  . Smokeless tobacco: Never Used  . Alcohol Use: No  . Drug Use: No  . Sexual Activity: Yes    Birth Control/ Protection: None   Other Topics Concern  . None   Social History Narrative  . None    Family History: Family History  Problem Relation Age of Onset  . Cancer Paternal Grandmother     breast  . Anxiety disorder Mother   . Heart disease Mother   . Stroke Paternal Aunt      Review of Systems: As per HPI.  Physical Exam: Filed Vitals:   01/27/13 1330  BP: 163/103  Pulse: 100  Temp:   Resp:  Gen:  Alert. NAD.   HEENT:  Minimal periorbital edema Abd:  Gravid, +RUQ and epigastric tenderness to palpation but no guarding or rebound Extr:  +1 DTR.  2 beat clonus elicited. FHR:  130's. Variability is gestational age appropriate.  Labs:  Recent Labs Lab 01/27/13 0823  AST 63*     Recent Labs Lab 01/27/13 0823  ALT 46*     Recent Labs Lab 01/27/13 0823  WBC 17.7*  HGB 14.0  HCT 41.0  PLT 214     Recent Labs Lab 01/27/13 0823  CREATININE 0.84     Impressions:  Haley [name] is a 22 y.o. G2P1001 at [redacted]w[redacted]d -   1. Persistent severe range blood pressures with evolving mild transaminitis 2. EFW on ultrasound is 16th percentile, noting AC is at the 3rd percentile 3. UA Dopplers demonstrates persistent reverse diastolic flow on most waveforms (75%) noting on 25% of the waveforms there is absent end diastolic flow 4. Desired permanent sterilization  I explained to the patient that this constellation of findings is consistent with severe preeclampsia and early onset of IUGR.   Summary of Recommendations: 1. Agree with initiation of course of corticosteroids, noting that if desired, it would be reasonable to give the 2nd dose 12 hours after the first given the severity of the patient's disease 2. Treat severe range blood pressures with antihypertensive IV (eg, labetalol) 3. CEFM 4. Continue magnesium sulfate for maternal seizure prophylaxis and neonatal CP prophylaxis 5. Would assess this patient clinically frequently to detect evidence of either maternal or fetal deterioration, noting that expectant management would not be recommended if deterioration of either mom or fetus became apparent 6. Would assess LFTs/Cr/CBC every 6 hours at a minimum given patient's symptoms to facilitate early detection of maternal end organ failure.  If Cr is at or above 1.2 or if patient becomes oliguric, delivery would be recommended.  If platelet count becomes less than 100,000, delivery would be recommended.  If patient becomes hypoglycemic and transaminitis becomes more progressive (eg, 5- to 10-fold current values)---would indicate acute fatty liver, I would recommend discontinuing expectant management.  If maternal HTN is not amenable to control then I would recommend delivery. 7. If eclampsia is encountered, would recommend treatment of eclamptic seizure to stabilize the maternal-fetal unit.  Then, once stabilization has been established, discontinue expectant management and deliver  this patient. 8. If patient remains undelivered by tomorrow our practice would gladly repeat Doppler studies daily. 9. I will defer discussion of permanent sterilization to your group. 10.  Although fetus is cephalic and CST/trial of labor is reasonable in context of stable mom and fetus, it is also reasonable for this mom to decline trial of labor and proceed with delivery by cesarean when criteria are met. 11. Lastly, regardless of whether this mom and baby continue to remain stable, I would recommend delivery by 28 weeks or shortly after steroid course is completed noting that my preference/recommendation would be for after completion of steroids, noting this remains contingent upon a reassuring fetal heart tracing and stable maternal condition. 12. NICU and anesthesia consults are recommended. 13. Given the maternal Hx and current clinical condition, I would recommend screening for antiphospholipid antibodies (Lupus anticoagulant, anticardiolipin IgG/IgM, and antibeta2glycoprotein I antibodies).  If any are found positive, I would empirically give this patient LMWH prophylaxis for 6-8 weeks postpartum (after cesarean section that will ultimately be needed) and then repeat antiphospholipid antibody panel 12 weeks later to confirm (by 2009 [place] criteria).  Of course, if the panel is initially negative then routine postoperative prophylaxis with SCD's until fully ambulatory is appropriate.  See [facility] report.  I spent in excess of 30 minutes reviewing and evaluating this patient's case with greater than 50% of this time in face to face discussion.  Page with questions.  Merideth Abbey, MD, MS, FACOG Assistant Professor, Maternal-Fetal Medicine

## 2013-01-27 NOTE — Op Note (Signed)
Haley [name] PROCEDURE DATE: 01/27/2013  PREOPERATIVE DIAGNOSIS: Intrauterine pregnancy at  [redacted]w[redacted]d weeks gestation; severe preeclampsia and HELLP syndrome  POSTOPERATIVE DIAGNOSIS: The same  PROCEDURE:     Cesarean Section  SURGEON:  Dr. Catalina Antigua  ASSISTANT: none  INDICATIONS: Haley [name] is a 22 y.o. G2P1102 at 101w3d scheduled for cesarean section secondary to severe preeclampsia and HELLP syndrome.  The risks of cesarean section discussed with the patient included but were not limited to: bleeding which may require transfusion or reoperation; infection which may require antibiotics; injury to bowel, bladder, ureters or other surrounding organs; injury to the fetus; need for additional procedures including hysterectomy in the event of a life-threatening hemorrhage; placental abnormalities wth subsequent pregnancies, incisional problems, thromboembolic phenomenon and other postoperative/anesthesia complications. The patient concurred with the proposed plan, giving informed written consent for the procedure.    FINDINGS:  Viable female infant in cephalic presentation.  Apgars 9 and 9, weight, 1 pounds and 4.5 ounces.  Clear amniotic fluid.  Intact placenta, three vessel cord.  Normal uterus, fallopian tubes and ovaries bilaterally.  ANESTHESIA:    Spinal INTRAVENOUS FLUIDS:500 ml ESTIMATED BLOOD LOSS: 500 ml URINE OUTPUT:  25 ml SPECIMENS: Placenta sent to pathology COMPLICATIONS: None immediate  PROCEDURE IN DETAIL:  The patient received intravenous antibiotics and had sequential compression devices applied to her lower extremities while in the preoperative area.  She was then taken to the operating room where anesthesia was induced and was found to be adequate. A foley catheter was placed into her bladder and attached to Jt Brabec gravity. She was then placed in a dorsal supine position with a leftward tilt, and prepped and draped in a sterile manner. After an adequate timeout was  performed, a Pfannenstiel skin incision was made with scalpel and carried through to the underlying layer of fascia. The fascia was incised in the midline and this incision was extended bilaterally using the Mayo scissors. Kocher clamps were applied to the superior aspect of the fascial incision and the underlying rectus muscles were dissected off bluntly. A similar process was carried out on the inferior aspect of the facial incision. The rectus muscles were separated in the midline bluntly and the peritoneum was entered bluntly. The Alexis self-retaining retractor was introduced into the abdominal cavity. Attention was turned to the lower uterine segment where a bladder flap was created, and a transverse hysterotomy was made with a scalpel and extended bilaterally bluntly. The infant was successfully delivered, and cord was clamped and cut and infant was handed over to awaiting neonatology team. Uterine massage was then administered and the placenta delivered intact with three-vessel cord. The uterus was cleared of clot and debris.  The hysterotomy was closed with 0 Vicryl in a running locked fashion, and an imbricating layer was also placed with a 0 Vicryl. Overall, excellent hemostasis was noted. The pelvis copiously irrigated and cleared of all clot and debris. Hemostasis was confirmed on all surfaces.  The fascia was then closed using 0 Vicryl in a running locked fashion.  The skin was closed in a subcuticular fashion using 3.0 Vicryl. The patient tolerated the procedure well. Sponge, lap, instrument and needle counts were correct x 2. She was taken to the recovery room in stable condition.    Guy Seese,PEGGYMD  01/27/2013 3:54 PM

## 2013-01-27 NOTE — Progress Notes (Addendum)
Patient ID: Haley [name], female   DOB: 31-Jul-1990, 22 y.o.   MRN: [id number] 22 yo G2P1 at [redacted]w[redacted]d with persistent severe range blood pressure, abnormal fetal dopplers on ultrasound with reverse flow and lab abnormalities consistent with HELLP syndrome. Patient with current complaint of headache and persistent epigastric pain. She denies nausea or visual disturbances. She reports good fetal movement and denies LOF or contractions.  FHT: baseline 130, min variability, variable decels, no accels Toco: no contractions  Patient was consented for delivery via cesarean section. Risks, benefits and alternatives were explained including but not limited to risks of bleeding, infection and damage to adjacent organs. Patient verbalized understanding and all questions were answered. Consent signed. Second dose of betamethasone could not be administered due to need for emergent delivery

## 2013-01-27 NOTE — ED Notes (Signed)
Patient brought in via EMS. Alert and oriented. Airway patent. Patient c/o right upper abd pain that radiates into back with nausea and vomiting since last night. Patient [redacted] weeks pregnant. Patient reports taking zofran during her pregnancy but reports last taking it yesterday morning. No active vomiting at this time. Per patient 2nd pregnancy, no complications. Per patient hypertension during 1st pregnancy but only with labor.

## 2013-01-28 ENCOUNTER — Encounter (HOSPITAL_COMMUNITY): Payer: Self-pay | Admitting: Obstetrics and Gynecology

## 2013-01-28 LAB — COMPREHENSIVE METABOLIC PANEL
ALT: 539 U/L — ABNORMAL HIGH (ref 0–35)
Albumin: 1.7 g/dL — ABNORMAL LOW (ref 3.5–5.2)
Alkaline Phosphatase: 82 U/L (ref 39–117)
Potassium: 5.6 mEq/L — ABNORMAL HIGH (ref 3.5–5.1)
Sodium: 132 mEq/L — ABNORMAL LOW (ref 135–145)
Total Protein: 4.6 g/dL — ABNORMAL LOW (ref 6.0–8.3)

## 2013-01-28 LAB — CBC
MCHC: 34.6 g/dL (ref 30.0–36.0)
Platelets: 70 10*3/uL — ABNORMAL LOW (ref 150–400)
RDW: 14.7 % (ref 11.5–15.5)

## 2013-01-28 LAB — BETA-2-GLYCOPROTEIN I ABS, IGG/M/A: Beta-2-Glycoprotein I IgM: 1 M Units (ref <20)

## 2013-01-28 LAB — CARDIOLIPIN ANTIBODIES, IGM+IGG
Anticardiolipin IgG: 4 GPL U/mL — ABNORMAL LOW (ref <23)
Anticardiolipin IgM: 2 MPL U/mL — ABNORMAL LOW (ref <11)

## 2013-01-28 LAB — LUPUS ANTICOAGULANT PANEL
DRVVT: 30 secs (ref <42.9)
Lupus Anticoagulant: NOT DETECTED

## 2013-01-28 LAB — URINE CULTURE

## 2013-01-28 MED ORDER — INFLUENZA VAC SPLIT QUAD 0.5 ML IM SUSP
0.5000 mL | INTRAMUSCULAR | Status: AC
  Administered 2013-01-30: 0.5 mL via INTRAMUSCULAR
  Filled 2013-01-28 (×2): qty 0.5

## 2013-01-28 NOTE — Lactation Note (Signed)
This note was copied from the chart of Haley [name]. Lactation Consultation Note    Initial consult with this mom of a NICU baby, now 24 hours post partum. Mom started pumping within .in 6 hours of delivery, and expressed 10 mls of colostrum. Mom is in AICU with HELLP syndrome and PIH. She is on magnesium drip. I did teaching with mom and dad on providing breast milk for a NICU baby, reviewed lactation services, and showed mom how to hand express. I was easily able to express an additional 5 mls of colostrum. Mom is very eager to breast feed, but is aware it will be weeks before the baby will be ready to actually breast feed. I explained that providing EBM for now is breast feeding for a premie. The baby is  27 3/[redacted] weeks gestation and SGA, weighing 1 lb 4 oz. Mom has Medicaid and was advised to call [facility] and set up an appointment to apply, so mom can get a loaner DEP on her discharge to home.  Dad is interested in talking to Lulu Riding about gas cards, since this will be an issue for this family, driving back and forth from [place].  Mom knows to call for questions/concerns.   Patient Name: Haley [name] ZOXWR'U Date: 01/28/2013 Reason for consult: Initial assessment;NICU baby;Infant < 6lbs   Maternal Data Formula Feeding for Exclusion: Yes (baby in NICU) Reason for exclusion: Admission to Intensive Care Unit (ICU) post-partum Infant to breast within first hour of birth: No Breastfeeding delayed due to:: Infant status Has patient been taught Hand Expression?: Yes Does the patient have breastfeeding experience prior to this delivery?: Yes  Feeding    LATCH Score/Interventions                      Lactation Tools Discussed/Used Tools: Pump WIC Program: Yes (mom has medicaid - will call to apply for [facility] -) Pump Review: Setup, frequency, and cleaning;Milk Storage;Other (comment) (hand expres, NICU book on EBM) Initiated by:: bedside RN wihin 6 hours of  delivery Date initiated:: 01/27/13   Consult Status Date: 01/29/13 Follow-up type: In-patient    Alfred Levins 01/28/2013, 4:28 PM

## 2013-01-28 NOTE — Progress Notes (Signed)
Subjective: Postpartum Day 1: Cesarean Delivery for HELLP syndrome  Patient reports incisional pain, tolerating PO, + flatus and no problems voiding.  Baby is doing well in NICU  Objective: Vital signs in last 24 hours: Temp:  [97.1 F (36.2 C)-99 F (37.2 C)] 98 F (36.7 C) (09/16 0410) Pulse Rate:  [72-108] 85 (09/16 0600) Resp:  [15-29] 18 (09/16 0600) BP: (96-231)/(54-131) 96/54 mmHg (09/16 0600) SpO2:  [94 %-100 %] 97 % (09/16 0600) Weight:  [67.541 kg (148 lb 14.4 oz)-69.627 kg (153 lb 8 oz)] 69.627 kg (153 lb 8 oz) (09/16 1610)  Physical Exam:  General: alert, cooperative and no distress Lochia: appropriate Uterine Fundus: firm Pulm: normal effort CV: regular pulse EXT: 2+ DTRs Incision: healing well, no significant drainage, pressure dressing still in place DVT Evaluation: No evidence of DVT seen on physical exam.   Recent Labs  01/27/13 2115 01/28/13 0520  HGB 11.0* 8.5*  HCT 31.5* 24.6*    Assessment/Plan: Status post Cesarean section. Doing well postoperatively.  - on Mag until 4pm today - labs this AM trending down (AST 662, ALT 539), platelets stable at 70.  - continue routine care - unsure about contraception- considering tubal - breastfeeding .  Haley [name] 01/28/2013, 7:01 AM

## 2013-01-28 NOTE — Progress Notes (Signed)
UR chart review completed.  

## 2013-01-28 NOTE — Progress Notes (Signed)
CSW will meet with pt to complete an assessment once magnesium is stopped.      

## 2013-01-29 ENCOUNTER — Other Ambulatory Visit: Payer: Medicaid Other

## 2013-01-29 ENCOUNTER — Encounter: Payer: Medicaid Other | Admitting: Advanced Practice Midwife

## 2013-01-29 LAB — CULTURE, BETA STREP (GROUP B ONLY)

## 2013-01-29 NOTE — Progress Notes (Signed)
Subjective: Postpartum Day 2: Cesarean Delivery Patient reports tolerating PO, + flatus and no problems voiding.    Objective: Vital signs in last 24 hours: Temp:  [98 F (36.7 C)-98.7 F (37.1 C)] 98 F (36.7 C) (09/17 1000) Pulse Rate:  [77-94] 91 (09/17 1000) Resp:  [18-20] 18 (09/17 1000) BP: (104-148)/(72-101) 142/74 mmHg (09/17 1000) SpO2:  [96 %-98 %] 98 % (09/17 1000) Weight:  [153 lb 8 oz (69.627 kg)] 153 lb 8 oz (69.627 kg) (09/17 0549)  Physical Exam:  General: alert, cooperative and no distress Lochia: appropriate Uterine Fundus: firm Incision: no significant drainage DVT Evaluation: No evidence of DVT seen on physical exam. Negative Homan's sign. No cords or calf tenderness. No significant calf/ankle edema.   Recent Labs  01/27/13 2115 01/28/13 0520  HGB 11.0* 8.5*  HCT 31.5* 24.6*    Assessment/Plan: Status post Cesarean section. Doing well postoperatively.  Continue current care. Pt pumping breastmilk. Plan for D/C tomorrow. Discussed contraceptive choices.  Pt undecided btwn BTL, Mirena, Nexplanon.  Social work to see pt today for financial assistance with drive from [place] to [facility] to NICU  LEFTWICH-KIRBY, Destyni Hoppel 01/29/2013, 11:23 AM

## 2013-01-29 NOTE — Lactation Note (Signed)
This note was copied from the chart of Haley Aarionna Germer. Lactation Consultation Note   Follow up consult with this mom of a NICU baby, now 39 hours old. Mom has not been doing hand expression due to extreme tenderness. I did some hand expression, and she has transitional milk, flowing very well. I advised mom to use standard setting, and pump 15-30 minutes, followed by at least some hand expression. Mom also has not call [facility] yet, and she is being discharged to home tomorrow. I advised mom to call [facility] early tomorrow, and I will see her prior to her discharge tomorrow.   Patient Name: Haley [name] ZOXWR'U Date: 01/29/2013 Reason for consult: Follow-up assessment;NICU baby   Maternal Data    Feeding Feeding Type: Breast Milk  LATCH Score/Interventions                      Lactation Tools Discussed/Used     Consult Status Consult Status: Follow-up Date: 01/30/13 Follow-up type: In-patient    Alfred Levins 01/29/2013, 7:08 PM

## 2013-01-30 LAB — COMPREHENSIVE METABOLIC PANEL
Albumin: 2.1 g/dL — ABNORMAL LOW (ref 3.5–5.2)
Alkaline Phosphatase: 124 U/L — ABNORMAL HIGH (ref 39–117)
BUN: 11 mg/dL (ref 6–23)
Creatinine, Ser: 0.59 mg/dL (ref 0.50–1.10)
GFR calc Af Amer: >90 mL/min (ref >90)
Glucose, Bld: 87 mg/dL (ref 70–99)
Potassium: 4.8 mEq/L (ref 3.5–5.1)
Total Protein: 5.6 g/dL — ABNORMAL LOW (ref 6.0–8.3)

## 2013-01-30 MED ORDER — OXYCODONE-ACETAMINOPHEN 5-325 MG PO TABS: 1.0000 | ORAL_TABLET | ORAL | Status: DC | PRN

## 2013-01-30 MED ORDER — IBUPROFEN 600 MG PO TABS: 600.0000 mg | ORAL_TABLET | Freq: Four times a day (QID) | ORAL | Status: DC

## 2013-01-30 NOTE — Lactation Note (Signed)
This note was copied from the chart of Haley Jeiry Birnbaum. Lactation Consultation Note   Follow up consutl with this mom of a NICU baby. Mom is being discharged to home, and rented a Medela DEP. Mom instructed in it's use. Mom given comfort gels for sore nipples - pink, tender, not breaskdown. Mom instructed in their use.   Mom has a great milk supply I will follow this family in the nICU.  Patient Name: Haley [name] ZOXWR'U Date: 01/30/2013 Reason for consult: Follow-up assessment;NICU baby   Maternal Data    Feeding    LATCH Score/Interventions                      Lactation Tools Discussed/Used Breast pump type: Double-Electric Breast Pump Pump Review: Setup, frequency, and cleaning;Milk Storage   Consult Status Consult Status: PRN Follow-up type:  (in NICU)    Alfred Levins 01/30/2013, 4:32 PM

## 2013-01-30 NOTE — Clinical Social Work Maternal (Signed)
    LATE ENTRY FROM 01/29/13:   Clinical Social Work Department PSYCHOSOCIAL ASSESSMENT - MATERNAL/CHILD 01/31/2013  Patient:  Haley [name], Haley [name]  Account Number:  [id number]  Admit Date:  01/27/2013  Marjo Bicker Name:   Skipper Cliche    Clinical Social Worker:  Nobie Putnam, LCSW   Date/Time:  01/29/2013 01:00 PM  Date Referred:  01/29/2013   Referral source  NICU     Referred reason  NICU   Other referral source:    I:  FAMILY / HOME ENVIRONMENT Child's legal guardian:  PARENT  Guardian - Name Guardian - Age Guardian - Address  Valeria Boza [address] [place], [place] [address]  Carolin Sicks, III     Other household support members/support persons Name Relationship DOB  Betsey Amen SON 54 year old   Other support:    II  PSYCHOSOCIAL DATA Information Source:  Patient Interview  Event organiser Employment:   Surveyor, quantity resources:  [employer] If Medicaid - County:  [employer] Other  Chemical engineer / Grade:   Maternity Care Coordinator / Child Services Coordination / Early Interventions:  Cultural issues impacting care:    III  STRENGTHS Strengths  Adequate Resources  Home prepared for Child (including basic supplies)  Supportive family/friends   Strength comment:    IV  RISK FACTORS AND CURRENT PROBLEMS Current Problem:  None   Risk Factor & Current Problem Patient Issue Family Issue Risk Factor / Current Problem Comment   N N     V  SOCIAL WORK ASSESSMENT CSW met with the pt & FOB to assess their current situation & offer resources as needed.  The couple lives together in their "small trailer that requires a lot of maintenance." FOB is the only adult working in the home.  MOB is trying to establish a photography business.  He works in [place], [place] & expressed concern about the cost of gas, since they would like to visit regularly.  CSW offered the couple gas vouchers as a resource & explained that they are only available  once a month. The couple seemed appreciative.  MOB told CSW that they have all the necessary supplies for the infant & eager to bring her home.  She plans to return the car seat she purchased since she suspects the baby will need a smaller one, at discharge.  MOB denies any history of depression.  FOB receives SSI for mental illness diagnoses of Schizoaffective disorder.  FOB is on any medication & thinks that he was misdiagnosed.  CSW completed SSI worksheet with MOB & will follow up to complete application packet.  The couple was very pleasant to talk to & appreciative of resources offered.  CSW will continue to follow & assist further, as needed.      VI SOCIAL WORK PLAN Social Work Plan  No Further Intervention Required / No Barriers to Discharge   Type of pt/family education:   If child protective services report - county:   If child protective services report - date:   Information/referral to community resources comment:   Other social work plan:

## 2013-01-30 NOTE — Discharge Summary (Signed)
Obstetric Discharge Summary Reason for Admission: Severe Preeclampsia, RUQ abdominal pain, HELLP Syndrome Prenatal Procedures: Magnesium, Betamethasone, and Labetalol given for severe Preeclampsia @ [redacted]w[redacted]d Intrapartum Procedures: cesarean: low cervical, transverse Postpartum Procedures: none Complications-Operative and Postpartum: Elevated AST, ALT, thrombocytopenia  Hemoglobin  Date Value Range Status  01/28/2013 8.5* 12.0 - 15.0 g/dL Final     DELTA CHECK NOTED     REPEATED TO VERIFY     HCT  Date Value Range Status  01/28/2013 24.6* 36.0 - 46.0 % Final   Results for orders placed during the hospital encounter of 01/27/13 (from the past 24 hour(s))  COMPREHENSIVE METABOLIC PANEL     Status: Abnormal   Collection Time    01/30/13  8:54 AM      Result Value Range   Sodium 137  135 - 145 mEq/L   Potassium 4.8  3.5 - 5.1 mEq/L   Chloride 105  96 - 112 mEq/L   CO2 25  19 - 32 mEq/L   Glucose, Bld 87  70 - 99 mg/dL   BUN 11  6 - 23 mg/dL   Creatinine, Ser 2.13  0.50 - 1.10 mg/dL   Calcium 7.7 (*) 8.4 - 10.5 mg/dL   Total Protein 5.6 (*) 6.0 - 8.3 g/dL   Albumin 2.1 (*) 3.5 - 5.2 g/dL   AST 086 (*) 0 - 37 U/L   ALT 261 (*) 0 - 35 U/L   Alkaline Phosphatase 124 (*) 39 - 117 U/L   Total Bilirubin 0.3  0.3 - 1.2 mg/dL   GFR calc non Af Amer >90  >90 mL/min   GFR calc Af Amer >90  >90 mL/min    Physical Exam:  General: alert, cooperative and no distress Lochia: appropriate Uterine Fundus: firm Incision: no significant drainage, no dehiscence DVT Evaluation: No evidence of DVT seen on physical exam. Negative Homan's sign. No significant calf/ankle edema.   Discharge Diagnoses: Preelampsia and HELLP Syndrome, Urgent PLTCS   Discharge Information: Date: 01/30/2013 Activity: pelvic rest Diet: routine Medications: Ibuprofen and Percocet Condition: stable with improving LFT's, edema, and symptoms Instructions: refer to practice specific booklet Discharge to: home  Haley [name] is a 22 yo G2P1102 who was transferred from [facility] for eval of Preeclampsia and RUQ abdominal pain. The preeclampsia was severe with BP's over 200/110 and HELLP syndrome developed, requiring urgent PLTCS at [redacted]w[redacted]d gestational age. Before surgery Betamethasone, Labetolol and Magnesium were administered. The PLTCS was without complication (EBL of 500). The patient is POD#3 and is doing well with adequate pain control (Percocet and Ibuprofen). Her ASL and ALT are elevated but downtrending and platelets are low, but stable (70). She remains unsure about which contraception to use, despite further education. She plans on breastfeeding her newborn daughter, and is ready to be discharged today.  Newborn Data: Live born female  Birth Weight: 1 lb 4.5 oz (580 g) APGAR: 9, 9  Will remain in NICU.  Haley [name] 01/30/2013, 7:49 AM  I have seen and examined this patient and agree with above documentation in the PA student's note. Pt will f/u in 1 week with [facility] for a repeat blood pressure (stable here off meds) and repeat labs. She will need further discussion on contraception as an outpatient as she is still undecided.     Rulon Abide, M.D. [facility] Fellow 01/30/2013 9:46 AM

## 2013-01-30 NOTE — Progress Notes (Signed)
Pt out in wheelchair   Teaching complete  Informed to remove  Dressing   At home in 2 days     Scope patch  Not on pt pt states it fell off  Days ago

## 2013-01-31 ENCOUNTER — Encounter (HOSPITAL_COMMUNITY)
Admission: RE | Admit: 2013-01-31 | Discharge: 2013-01-31 | Disposition: A | Discharge status: Home / Self Care | Payer: Medicaid Other | Source: Ambulatory Visit | Attending: Obstetrics & Gynecology | Admitting: Obstetrics & Gynecology

## 2013-01-31 DIAGNOSIS — O923 Agalactia: Secondary | ICD-10-CM | POA: Insufficient documentation

## 2013-02-01 ENCOUNTER — Inpatient Hospital Stay (HOSPITAL_COMMUNITY)
Admission: AD | Admit: 2013-02-01 | Discharge: 2013-02-01 | Disposition: A | Discharge status: Home / Self Care | Payer: Medicaid Other | Source: Ambulatory Visit | Attending: Obstetrics & Gynecology | Admitting: Obstetrics & Gynecology

## 2013-02-01 ENCOUNTER — Encounter (HOSPITAL_COMMUNITY): Payer: Self-pay

## 2013-02-01 DIAGNOSIS — O909 Complication of the puerperium, unspecified: Secondary | ICD-10-CM | POA: Insufficient documentation

## 2013-02-01 DIAGNOSIS — G8918 Other acute postprocedural pain: Secondary | ICD-10-CM

## 2013-02-01 LAB — URINALYSIS, ROUTINE W REFLEX MICROSCOPIC
Glucose, UA: NEGATIVE mg/dL
Ketones, ur: 15 mg/dL — AB
Leukocytes, UA: NEGATIVE
Nitrite: NEGATIVE
Protein, ur: >300 mg/dL — AB
pH: 6 (ref 5.0–8.0)

## 2013-02-01 LAB — CBC WITH DIFFERENTIAL/PLATELET
Basophils Absolute: 0.1 10*3/uL (ref 0.0–0.1)
Basophils Relative: 0 % (ref 0–1)
Hemoglobin: 8.3 g/dL — ABNORMAL LOW (ref 12.0–15.0)
Lymphocytes Relative: 9 % — ABNORMAL LOW (ref 12–46)
MCHC: 33.1 g/dL (ref 30.0–36.0)
Neutro Abs: 19.1 10*3/uL — ABNORMAL HIGH (ref 1.7–7.7)
Neutrophils Relative %: 82 % — ABNORMAL HIGH (ref 43–77)
RDW: 16.1 % — ABNORMAL HIGH (ref 11.5–15.5)
WBC: 23.3 10*3/uL — ABNORMAL HIGH (ref 4.0–10.5)

## 2013-02-01 LAB — URINE MICROSCOPIC-ADD ON

## 2013-02-01 MED ORDER — OXYCODONE-ACETAMINOPHEN 5-325 MG PO TABS
2.0000 | ORAL_TABLET | Freq: Once | ORAL | Status: AC
  Administered 2013-02-01: 2 via ORAL
  Filled 2013-02-01: qty 2

## 2013-02-01 MED ORDER — IBUPROFEN 800 MG PO TABS
800.0000 mg | ORAL_TABLET | Freq: Three times a day (TID) | ORAL | Status: DC | PRN
  Administered 2013-02-01: 800 mg via ORAL
  Filled 2013-02-01: qty 1

## 2013-02-01 NOTE — MAU Note (Signed)
Pt pumping

## 2013-02-01 NOTE — MAU Note (Signed)
Honeycomb dressing removed per Genella Rife CNM

## 2013-02-01 NOTE — MAU Provider Note (Signed)
History     CSN: [id number]  Arrival date and time: 02/01/13 1478   First Provider Initiated Contact with Patient 02/01/13 2055      Chief Complaint  Patient presents with  . Incisional Pain   [id number] No LMP recorded. Post-op day 5 after emergency cesarean section 01/27/13 at [redacted] weeks gestation with increasing lower abdominal pain. She had severe preeclampsia and low platelets.   Pertinent Gynecological History: Menses: lochia    Past Medical History  Diagnosis Date  . Heart murmur   . Hx of chlamydia infection   . Abnormal Pap smear   . HSV-2 (herpes simplex virus 2) infection   . Pregnant     Past Surgical History  Procedure Laterality Date  . Wisdom tooth extraction  2012  . Cesarean section N/A 01/27/2013    Procedure: CESAREAN SECTION;  Surgeon: Catalina Antigua, MD;  Location: [facility];  Service: Obstetrics;  Laterality: N/A;    Family History  Problem Relation Age of Onset  . Cancer Paternal Grandmother     breast  . Anxiety disorder Mother   . Heart disease Mother   . Stroke Paternal Aunt     History  Substance Use Topics  . Smoking status: Former Smoker    Types: Cigarettes    Quit date: 12/14/2010  . Smokeless tobacco: Never Used  . Alcohol Use: No    Allergies:  Allergies  Allergen Reactions  . Penicillins Other (See Comments)    Unknown childhood reaction    Prescriptions prior to admission  Medication Sig Dispense Refill  . ondansetron (ZOFRAN) 8 MG tablet Take 1 tablet (8 mg total) by mouth every 8 (eight) hours as needed for nausea.  30 tablet  3  . oxyCODONE-acetaminophen (PERCOCET/ROXICET) 5-325 MG per tablet Take 1-2 tablets by mouth every 4 (four) hours as needed.  20 tablet  0  . ibuprofen (ADVIL,MOTRIN) 600 MG tablet Take 1 tablet (600 mg total) by mouth every 6 (six) hours.  30 tablet  0    Review of Systems  Constitutional: Negative for fever, chills and diaphoresis.  Respiratory: Negative for cough and shortness of breath.    Gastrointestinal: Positive for abdominal pain. Negative for nausea.  Genitourinary: Negative for dysuria, urgency, frequency and flank pain.  Musculoskeletal: Negative for myalgias.   Physical Exam   Blood pressure 123/67, pulse 127, temperature 101.7 F (38.7 C), resp. rate 20, SpO2 98.00%, unknown if currently breastfeeding. Filed Vitals:   02/01/13 1950 02/01/13 2025 02/01/13 2030 02/01/13 2158  BP: 144/95 147/94 123/67   Pulse: 121 129 127   Temp: 101.7 F (38.7 C)   99.8 F (37.7 C)  TempSrc:    Oral  Resp: 20 20    SpO2: 98%       Physical Exam  Nursing note and vitals reviewed. Constitutional: She is oriented to person, place, and time. She appears well-developed. She appears distressed (mild discomfort noted).  Neck: Normal range of motion.  Cardiovascular: Normal rate and normal heart sounds.   Respiratory: Effort normal and breath sounds normal. No respiratory distress.  GI: Soft. She exhibits distension (mld). She exhibits no mass. There is tenderness (mild lowere abdomen). There is no rebound and no guarding.  incision intact, with steristrips. bruising,   Genitourinary: Vaginal discharge (normal lochia, minimal uterine tenderness) found.  Musculoskeletal: She exhibits edema (trace ankle edema).  1.5 cm erythema round area right shoulder site of flu vaccine  Neurological: She is alert and oriented to person, place, and time.  Skin: Skin is warm and dry.  Psychiatric: She has a normal mood and affect. Her behavior is normal.    MAU Course  Procedures CBC    Component Value Date/Time   WBC 23.3* 02/01/2013 2056   RBC 2.78* 02/01/2013 2056   HGB 8.3* 02/01/2013 2056   HCT 25.1* 02/01/2013 2056   PLT 317 02/01/2013 2056   MCV 90.3 02/01/2013 2056   MCH 29.9 02/01/2013 2056   MCHC 33.1 02/01/2013 2056   RDW 16.1* 02/01/2013 2056   LYMPHSABS 2.1 02/01/2013 2056   MONOABS 1.9* 02/01/2013 2056   EOSABS 0.1 02/01/2013 2056   BASOSABS 0.1 02/01/2013 2056   Urinalysis     Component Value Date/Time   COLORURINE YELLOW 02/01/2013 2010   APPEARANCEUR CLEAR 02/01/2013 2010   LABSPEC >1.030* 02/01/2013 2010   PHURINE 6.0 02/01/2013 2010   GLUCOSEU NEGATIVE 02/01/2013 2010   HGBUR LARGE* 02/01/2013 2010   BILIRUBINUR SMALL* 02/01/2013 2010   KETONESUR 15* 02/01/2013 2010   PROTEINUR >300* 02/01/2013 2010   UROBILINOGEN 1.0 02/01/2013 2010   NITRITE NEGATIVE 02/01/2013 2010   NITRITE neg 01/01/2013 1321   LEUKOCYTESUR NEGATIVE 02/01/2013 2010      MDM moderate  Assessment and Plan  Postoperative pain and fever, improved in MAU and I see no localized sign of infection. Will discharge with f/u at [facility] next week, [place] ibuprofen and Percocet for pain as needed  ARNOLD,JAMES 02/01/2013, 9:29 PM

## 2013-02-01 NOTE — MAU Note (Signed)
Emergency c/s 9/15 due to HELLP and PIH. Having pain L side of incision.

## 2013-02-03 LAB — URINE CULTURE

## 2013-02-04 ENCOUNTER — Encounter: Payer: Self-pay | Admitting: Women's Health

## 2013-02-04 ENCOUNTER — Ambulatory Visit (INDEPENDENT_AMBULATORY_CARE_PROVIDER_SITE_OTHER): Payer: Medicaid Other | Admitting: Women's Health

## 2013-02-04 VITALS — BP 134/90 | Ht 61.0 in | Wt 142.2 lb

## 2013-02-04 DIAGNOSIS — T148XXA Other injury of unspecified body region, initial encounter: Secondary | ICD-10-CM

## 2013-02-04 DIAGNOSIS — O1420 HELLP syndrome (HELLP), unspecified trimester: Secondary | ICD-10-CM | POA: Insufficient documentation

## 2013-02-04 DIAGNOSIS — R3911 Hesitancy of micturition: Secondary | ICD-10-CM

## 2013-02-04 DIAGNOSIS — IMO0002 Reserved for concepts with insufficient information to code with codable children: Secondary | ICD-10-CM

## 2013-02-04 DIAGNOSIS — O1490 Unspecified pre-eclampsia, unspecified trimester: Secondary | ICD-10-CM

## 2013-02-04 DIAGNOSIS — Z98891 History of uterine scar from previous surgery: Secondary | ICD-10-CM

## 2013-02-04 MED ORDER — OXYCODONE-ACETAMINOPHEN 5-325 MG PO TABS: 1.0000 | ORAL_TABLET | ORAL | Status: DC | PRN

## 2013-02-04 MED ORDER — SULFAMETHOXAZOLE-TMP DS 800-160 MG PO TABS: 1.0000 | ORAL_TABLET | Freq: Two times a day (BID) | ORAL | Status: DC

## 2013-02-04 MED ORDER — HYDROCHLOROTHIAZIDE 25 MG PO TABS: 25.0000 mg | ORAL_TABLET | Freq: Every day | ORAL | Status: DC

## 2013-02-04 NOTE — Progress Notes (Signed)
Patient ID: Haley [name], female   DOB: Sep 17, 1990, 22 y.o.   MRN: [id number] Haley [name] is a 22 y.o. G82P1102 female who is 8 days s/p c/s at 27wks for severe pre-e/HELLP, here for f/u from MAU visit 02/01/13.  At that time she presented w/ report of incisional pain, was noted to have temp of 101.7, WBC was 23 which was decreased from 28 on d/c from hospital. She was examined by Dr. Debroah Loop, no local site of infection determined, temp came down w/ apap and she was d/c'd to f/u today.   She reports pain in lower abdomen when voiding, 'not like uti pain', also states urine flow will stop and she has to restart it. Denies fever/chills. HA this am that was r/b her last 2 percocet- requests refill.  Denies scotomata, ruq/epigastric pain, n/v, uti s/s. Pumping and taking large amounts of milk to NICU for baby.  O: BP 134/90  Ht 5\' 1"  (1.549 m)  Wt 142 lb 3.2 oz (64.501 kg)  BMI 26.88 kg/m2  Breastfeeding? Yes       Co-exam w/ LHE, deep purple ecchymosis surrounding c/s incision and down to mons, multiple different stage   ecchymotic areas all over body. Incision well-approximated, steri-strips in place, no erythema, induration,   discharge, odor.         DTRs 2+, no clonus, trace BLE edema  A: 8d s/p c/s for severe pre-e/HELLP     Possible hidden hematoma as site of infection, and cause of pain per LHE     Pumping, infant in NICU  P:  Rx: Septra DS BID x 7d      Rx: HCTZ 25mg  daily #30      Rx: Percocet 5/325mg  #20, 0RF, instructed to wean off of percocet      F/U 1wk for bp check, incision check  Cheral Marker, CNM, [facility] 02/04/2013

## 2013-02-04 NOTE — Patient Instructions (Addendum)
Cesarean Delivery  Cesarean delivery is the birth of a baby through a cut (incision) in the abdomen and womb (uterus).  LET YOUR CAREGIVER KNOW ABOUT:  Complicationsinvolving the pregnancy.  Allergies.  Medicines taken including herbs, eyedrops, over-the-counter medicines, and creams.  Use of steroids (by mouth or creams).  Previous problems with anesthetics or numbing medicine.  Previous surgery.  History of blood clots.  History of bleeding or blood problems.  Other health problems. RISKS AND COMPLICATIONS   Bleeding.  Infection.  Blood clots.  Injury to surrounding organs.  Anesthesia problems.  Injury to the baby. BEFORE THE PROCEDURE   A tube (Foley catheter) will be placed in your bladder. The Foley catheter drains the urine from your bladder into a bag. This keeps your bladder empty during surgery.  An intravenous access tube (IV) will be placed in your arm.  Hair may be removed from your pubic area and your lower abdomen. This is to prevent infection in the incision site.  You may be given an antacid medicine to drink. This will prevent acid contents in your stomach from going into your lungs if you vomit during the surgery.  You may be given an antibiotic medicine to prevent infection. PROCEDURE   You may be given medicine to numb the lower half of your body (regional anesthetic). If you were in labor, you may have already had an epidural in place which can be used in both labor and cesarean delivery. You may possibly be given medicine to make you sleep (general anesthetic) though this is not as common.  An incision will be made in your abdomen that extends to your uterus. There are 2 basic kinds of incisions:  The horizontal (transverse) incision. Horizontal incisions are used for most routine cesarean deliveries.  The vertical (up and down) incision. This is less commonly used. This is most often reserved for women who have a serious complication  (extreme prematurity) or under emergency situations.  The horizontal and vertical incisions may both be used at the same time. However, this is very uncommon.  Your baby will then be delivered. AFTER THE PROCEDURE   If you were awake during the surgery, you will see your baby right away. If you were asleep, you will see your baby as soon as you are awake.  You may breastfeed your baby after surgery.  You may be able to get up and walk the same day as the surgery. If you need to stay in bed for a period of time, you will receive help to turn, cough, and take deep breaths after surgery. This helps prevent lung problems such as pneumonia.  Do not get out of bed alone the first time after surgery. You will need help getting out of bed until you are able to do this by yourself.  You may be able to shower the day after your cesarean delivery. After the bandage (dressing) is taken off the incision site, a nurse will assist you to shower, if you like.  You will have pneumatic compressing hose placed on your feet or lower legs. These hose are used to prevent blood clots. When you are up and walking regularly, they will no longer be necessary.  Do not cross your legs when you sit.  Save any blood clots that you pass. If you pass a clot while on the toilet, do not flush it. Call for the nurse. Tell the nurse if you think you are bleeding too much or passing too many   clots.  Start drinking liquids and eating food as directed by your caregiver. If your stomach is not ready, drinking and eating too soon can cause an increase in bloating and swelling of your intestine and abdomen. This is very uncomfortable.  You will be given medicine as needed. Let your caregivers know if you are hurting. They want you to be comfortable. You may also be given an antibiotic to prevent an infection.  Your IV will be taken out when you are drinking a reasonable amount of fluids. The Foley catheter is taken out when  you are up and walking.  If your blood type is Rh negative and your baby's blood type is Rh positive, you will be given a shot of anti-D immune globulin. This shot prevents you from having Rh problems with a future pregnancy. You should get the shot even if you had your tubes tied (tubal ligation).  If you are allowed to take the baby for a walk, place the baby in the bassinet and push it. Do not carry your baby in your arms. Document Released: 05/01/2005 Document Revised: 07/24/2011 Document Reviewed: 08/26/2010 [employer] Patient Information 2014 [employer].  

## 2013-02-05 ENCOUNTER — Ambulatory Visit: Payer: Self-pay

## 2013-02-05 NOTE — Lactation Note (Signed)
This note was copied from the chart of Girl Oriya Kettering. Lactation Consultation Note     Follow up consult with this mom of a NICU baby, now 58 days old, and 61 55/16 weeks old, and weighing 1 lb 7.3 oz. Mom is pumping 8 oz at a time. She was seen by her doctor, and is being treated for a suspected bladder/kidney infection, with antibitics. She is also prescribed HCTZ for hypertension. Mom was checking to see if these were compatible with breast feeding. I assured her they were, and that it is important that she control her blood pressure, both for  her health  and that a high BP can decrease her supply.  I will follow this mom and baby in the NICU.  Patient Name: Girl Jenafer Winterton [id number] Date: 02/05/2013 Reason for consult: Follow-up assessment;NICU baby   Maternal Data    Feeding    LATCH Score/Interventions                      Lactation Tools Discussed/Used     Consult Status Consult Status: PRN Follow-up type:  (in NICU)    Alfred Levins 02/05/2013, 6:08 PM

## 2013-02-06 ENCOUNTER — Telehealth: Payer: Self-pay | Admitting: *Deleted

## 2013-02-06 ENCOUNTER — Encounter (HOSPITAL_COMMUNITY): Payer: Self-pay | Admitting: *Deleted

## 2013-02-06 ENCOUNTER — Emergency Department (HOSPITAL_COMMUNITY)
Admission: EM | Admit: 2013-02-06 | Discharge: 2013-02-06 | Disposition: A | Discharge status: Home / Self Care | Payer: Medicaid Other | Attending: Emergency Medicine | Admitting: Emergency Medicine

## 2013-02-06 DIAGNOSIS — H538 Other visual disturbances: Secondary | ICD-10-CM | POA: Insufficient documentation

## 2013-02-06 DIAGNOSIS — O9081 Anemia of the puerperium: Secondary | ICD-10-CM | POA: Insufficient documentation

## 2013-02-06 DIAGNOSIS — Z880 Allergy status to penicillin: Secondary | ICD-10-CM | POA: Insufficient documentation

## 2013-02-06 DIAGNOSIS — O26899 Other specified pregnancy related conditions, unspecified trimester: Secondary | ICD-10-CM | POA: Insufficient documentation

## 2013-02-06 DIAGNOSIS — Z87891 Personal history of nicotine dependence: Secondary | ICD-10-CM | POA: Insufficient documentation

## 2013-02-06 DIAGNOSIS — D649 Anemia, unspecified: Secondary | ICD-10-CM

## 2013-02-06 DIAGNOSIS — Z79899 Other long term (current) drug therapy: Secondary | ICD-10-CM | POA: Insufficient documentation

## 2013-02-06 DIAGNOSIS — Z8619 Personal history of other infectious and parasitic diseases: Secondary | ICD-10-CM | POA: Insufficient documentation

## 2013-02-06 DIAGNOSIS — R51 Headache: Secondary | ICD-10-CM | POA: Insufficient documentation

## 2013-02-06 DIAGNOSIS — R011 Cardiac murmur, unspecified: Secondary | ICD-10-CM | POA: Insufficient documentation

## 2013-02-06 DIAGNOSIS — R55 Syncope and collapse: Secondary | ICD-10-CM

## 2013-02-06 LAB — BASIC METABOLIC PANEL
BUN: 16 mg/dL (ref 6–23)
GFR calc Af Amer: >90 mL/min (ref >90)
GFR calc non Af Amer: >90 mL/min (ref >90)
Potassium: 4 mEq/L (ref 3.5–5.1)
Sodium: 137 mEq/L (ref 135–145)

## 2013-02-06 LAB — CBC WITH DIFFERENTIAL/PLATELET
Basophils Relative: 0 % (ref 0–1)
Eosinophils Absolute: 0.2 10*3/uL (ref 0.0–0.7)
Hemoglobin: 9.6 g/dL — ABNORMAL LOW (ref 12.0–15.0)
Lymphocytes Relative: 15 % (ref 12–46)
MCHC: 32.3 g/dL (ref 30.0–36.0)
Monocytes Relative: 6 % (ref 3–12)
Neutrophils Relative %: 78 % — ABNORMAL HIGH (ref 43–77)
RBC: 3.33 MIL/uL — ABNORMAL LOW (ref 3.87–5.11)
WBC: 17.9 10*3/uL — ABNORMAL HIGH (ref 4.0–10.5)

## 2013-02-06 MED ORDER — FERROUS SULFATE 325 (65 FE) MG PO TABS: 325.0000 mg | ORAL_TABLET | Freq: Every day | ORAL | Status: DC

## 2013-02-06 NOTE — ED Notes (Signed)
Upon entering room, pt is sitting upright in bed, talking on the phone. Pt would not dismiss private phone call to discuss needs at this time. Pt states on phone she is" unable to talk a shower without feeling woozy and crawling back to bed." RN informed pt and family she would be back when pt is  Able to discuss her care and concerns with staff.

## 2013-02-06 NOTE — Telephone Encounter (Signed)
Pt states was at [facility] department due to syncope and elevated B/P. Pt transferred to [facility] ER, pt now calling from ER states was told to stop blood pressure (HCTZ) med by ER MD,  Pt states concerned because she had pre-eclampsia prior to delivery (C-section 01/27/2013). An appt made with Dr. Despina Hidden tomorrow to f/u from ER visit today.

## 2013-02-06 NOTE — ED Notes (Signed)
EMS called out to [facility] Department secondary to a near syncope episode and headache. Pt is 10 days post-partum with HELLP syndrome and was pre-eclamptic. 27week fetus delivered by c-section on 01/27/2013

## 2013-02-06 NOTE — ED Notes (Signed)
Pt pumping her breast at this time.

## 2013-02-06 NOTE — ED Notes (Signed)
Pt presents after being seen at [facility] Department for [facility] check during post-partum via EMS secondary to a near syncope episode and increased pressure in head. Was reports was pre-eclamptic and had HELLP syndrome before delivery. Pt remains on hydrochloride thiazide. CBG upon EMS arrival was 145, BP 140/99. Steri strips intact at lower abdominal incision site, no drainage or redness noted at the site. Some bruising noted at incision site. Pt reports was on mag sulfate before delivery. Pt is alert oriented x 4, sitting up and discussing her delivery and infant. NAD noted at this time. Denies vision changes at this time.

## 2013-02-06 NOTE — ED Provider Notes (Signed)
CSN: [id number]     Arrival date & time 02/06/13  1205 History   First MD Initiated Contact with Patient 02/06/13 1230     Chief Complaint  Patient presents with  . Near Syncope  . Postpartum Complications   (Consider location/radiation/quality/duration/timing/severity/associated sxs/prior Treatment) HPI.... patient nearly passed out today.  She complains of headache, blurred vision.   Status post cesarean section on 01/30/2013.   Is a complicated by preeclampsia and HELLP syndrome.  No chest pain, dyspnea, neuro deficits.   Patient did not eat or drink today.  Rx hydrochlorothiazide for her blood pressure which she did not take today.   She feels much better now  Past Medical History  Diagnosis Date  . Heart murmur   . Hx of chlamydia infection   . Abnormal Pap smear   . HSV-2 (herpes simplex virus 2) infection   . Pregnant    Past Surgical History  Procedure Laterality Date  . Wisdom tooth extraction  2012  . Cesarean section N/A 01/27/2013    Procedure: CESAREAN SECTION;  Surgeon: Catalina Antigua, MD;  Location: [facility];  Service: Obstetrics;  Laterality: N/A;   Family History  Problem Relation Age of Onset  . Cancer Paternal Grandmother     breast  . Anxiety disorder Mother   . Heart disease Mother   . Stroke Paternal Aunt    History  Substance Use Topics  . Smoking status: Former Smoker    Types: Cigarettes    Quit date: 12/14/2010  . Smokeless tobacco: Never Used  . Alcohol Use: No   OB History   Grav Para Term Preterm Abortions TAB SAB Ect Mult Living   2 2 1 1      2      Review of Systems  All other systems reviewed and are negative.    Allergies  Penicillins  Home Medications   Current Outpatient Rx  Name  Route  Sig  Dispense  Refill  . hydrochlorothiazide (HYDRODIURIL) 25 MG tablet   Oral   Take 1 tablet (25 mg total) by mouth daily.   30 tablet   3   . ibuprofen (ADVIL,MOTRIN) 600 MG tablet   Oral   Take 1 tablet (600 mg total) by mouth every  6 (six) hours.   30 tablet   0   . oxyCODONE-acetaminophen (PERCOCET/ROXICET) 5-325 MG per tablet   Oral   Take 1-2 tablets by mouth every 4 (four) hours as needed.   20 tablet   0   . sulfamethoxazole-trimethoprim (BACTRIM DS) 800-160 MG per tablet   Oral   Take 1 tablet by mouth 2 (two) times daily.   14 tablet   0   . ferrous sulfate 325 (65 FE) MG tablet   Oral   Take 1 tablet (325 mg total) by mouth daily.   30 tablet   1    BP 125/92  Pulse 117  Temp(Src) 98.4 F (36.9 C) (Oral)  Resp 18  Ht 5' (1.524 m)  Wt 135 lb (61.236 kg)  BMI 26.37 kg/m2  SpO2 96%  Breastfeeding? Yes Physical Exam  Nursing note and vitals reviewed. Constitutional: She is oriented to person, place, and time. She appears well-developed and well-nourished.  HENT:  Head: Normocephalic and atraumatic.  Eyes: Conjunctivae and EOM are normal. Pupils are equal, round, and reactive to light.  Neck: Normal range of motion. Neck supple.  Cardiovascular: Normal rate, regular rhythm and normal heart sounds.   Pulmonary/Chest: Effort normal and breath sounds  normal.  Abdominal: Soft. Bowel sounds are normal.  Musculoskeletal: Normal range of motion.  Neurological: She is alert and oriented to person, place, and time.  Skin: Skin is warm and dry.  Psychiatric: She has a normal mood and affect.    ED Course  Procedures (including critical care time) Labs Review Labs Reviewed  BASIC METABOLIC PANEL - Abnormal; Notable for the following:    Glucose, Bld 112 (*)    All other components within normal limits  CBC WITH DIFFERENTIAL - Abnormal; Notable for the following:    WBC 17.9 (*)    RBC 3.33 (*)    Hemoglobin 9.6 (*)    HCT 29.7 (*)    Platelets 670 (*)    Neutrophils Relative % 78 (*)    Neutro Abs 13.9 (*)    Monocytes Absolute 1.1 (*)    All other components within normal limits   Imaging Review No results found.  Date: 02/06/2013  Rate: 104  Rhythm: sinus tachy  QRS Axis:  normal  Intervals: normal  ST/T Wave abnormalities: normal  Conduction Disutrbances: none  Narrative Interpretation: unremarkable    MDM   1. Syncope   2. Anemia    Blood pressures normalized.   Will hold hydrochlorothiazide. Rx iron for her anemia.  Patient will follow up with her gynecologist next month for both anemia and hypertension.   She is hemodynamically stable at discharge    Donnetta Hutching, MD 02/06/13 1459

## 2013-02-07 ENCOUNTER — Encounter: Payer: Self-pay | Admitting: Obstetrics & Gynecology

## 2013-02-07 ENCOUNTER — Ambulatory Visit (INDEPENDENT_AMBULATORY_CARE_PROVIDER_SITE_OTHER): Payer: Medicaid Other | Admitting: Obstetrics & Gynecology

## 2013-02-07 VITALS — BP 90/60 | Wt 135.0 lb

## 2013-02-07 DIAGNOSIS — Z9889 Other specified postprocedural states: Secondary | ICD-10-CM

## 2013-02-07 DIAGNOSIS — IMO0002 Reserved for concepts with insufficient information to code with codable children: Secondary | ICD-10-CM

## 2013-02-07 NOTE — Progress Notes (Signed)
Patient ID: Haley [name], female   DOB: April 25, 1991, 22 y.o.   MRN: [id number] BP OK  HGB stable Incision stable doing well  Hold HCTZ for now Follow up on 10/6

## 2013-02-10 ENCOUNTER — Ambulatory Visit: Payer: Medicaid Other | Admitting: Women's Health

## 2013-02-17 ENCOUNTER — Ambulatory Visit: Payer: Medicaid Other | Admitting: Obstetrics & Gynecology

## 2013-02-17 ENCOUNTER — Ambulatory Visit: Payer: Medicaid Other | Admitting: Adult Health

## 2013-03-03 ENCOUNTER — Ambulatory Visit: Payer: Self-pay

## 2013-03-03 NOTE — Lactation Note (Signed)
This note was copied from the chart of Haley Curlie Sittner. Lactation Consultation Note Mom request LC to answer questions; mom at baby's bedside holding baby STS. Mom wants to know if baby will nuzzle and lick at the breast first, or take a bottle first. Informed mom that those decisions will be made based on condition and stability of baby, closer to the time for baby to start oral feeds. Mom is concerned that baby will not be able to latch to the breast if she takes bottle first. Offered reassurance to mom that staff will do everything we can to make sure breast feeding goes well. Discussed the outpatient clinic and enc mom to utilize all resources available to her. Questions answered.    Patient Name: Haley [name] ZOXWR'U Date: 03/03/2013     Maternal Data    Feeding    LATCH Score/Interventions                      Lactation Tools Discussed/Used     Consult Status      Lenard Forth 03/03/2013, 3:33 PM

## 2013-03-18 ENCOUNTER — Encounter: Payer: Self-pay | Admitting: Advanced Practice Midwife

## 2013-03-18 ENCOUNTER — Ambulatory Visit: Payer: Medicaid Other | Admitting: Advanced Practice Midwife

## 2013-03-18 NOTE — Progress Notes (Unsigned)
Patient ID: Haley [name], female   DOB: [birth year], 22 y.o.   MRN: [id number] Pt had to reschedule do to no insurance coverage.

## 2013-04-03 ENCOUNTER — Telehealth: Payer: Self-pay | Admitting: Adult Health

## 2013-04-03 ENCOUNTER — Ambulatory Visit (INDEPENDENT_AMBULATORY_CARE_PROVIDER_SITE_OTHER): Payer: Medicaid Other | Admitting: Adult Health

## 2013-04-03 ENCOUNTER — Encounter: Payer: Self-pay | Admitting: Adult Health

## 2013-04-03 ENCOUNTER — Inpatient Hospital Stay (HOSPITAL_COMMUNITY)
Admission: EM | Admit: 2013-04-03 | Discharge: 2013-04-07 | DRG: 058 | Disposition: A | Discharge status: Home / Self Care | Payer: Medicaid Other | Attending: Internal Medicine | Admitting: Internal Medicine

## 2013-04-03 ENCOUNTER — Encounter (HOSPITAL_COMMUNITY): Payer: Self-pay | Admitting: Emergency Medicine

## 2013-04-03 ENCOUNTER — Emergency Department (HOSPITAL_COMMUNITY): Payer: Medicaid Other

## 2013-04-03 ENCOUNTER — Ambulatory Visit (HOSPITAL_COMMUNITY)
Admission: RE | Admit: 2013-04-03 | Discharge: 2013-04-03 | Disposition: A | Discharge status: Home / Self Care | Payer: Medicaid Other | Source: Ambulatory Visit | Attending: Adult Health | Admitting: Adult Health

## 2013-04-03 DIAGNOSIS — O1420 HELLP syndrome (HELLP), unspecified trimester: Secondary | ICD-10-CM

## 2013-04-03 DIAGNOSIS — M216X9 Other acquired deformities of unspecified foot: Secondary | ICD-10-CM | POA: Diagnosis present

## 2013-04-03 DIAGNOSIS — F329 Major depressive disorder, single episode, unspecified: Secondary | ICD-10-CM

## 2013-04-03 DIAGNOSIS — M21371 Foot drop, right foot: Secondary | ICD-10-CM

## 2013-04-03 DIAGNOSIS — B009 Herpesviral infection, unspecified: Secondary | ICD-10-CM

## 2013-04-03 DIAGNOSIS — F53 Postpartum depression: Secondary | ICD-10-CM

## 2013-04-03 DIAGNOSIS — O99345 Other mental disorders complicating the puerperium: Secondary | ICD-10-CM

## 2013-04-03 DIAGNOSIS — G936 Cerebral edema: Secondary | ICD-10-CM | POA: Diagnosis present

## 2013-04-03 DIAGNOSIS — Z87891 Personal history of nicotine dependence: Secondary | ICD-10-CM

## 2013-04-03 DIAGNOSIS — Z3200 Encounter for pregnancy test, result unknown: Secondary | ICD-10-CM

## 2013-04-03 DIAGNOSIS — Z3202 Encounter for pregnancy test, result negative: Secondary | ICD-10-CM

## 2013-04-03 DIAGNOSIS — Z309 Encounter for contraceptive management, unspecified: Secondary | ICD-10-CM

## 2013-04-03 DIAGNOSIS — Z8759 Personal history of other complications of pregnancy, childbirth and the puerperium: Secondary | ICD-10-CM

## 2013-04-03 DIAGNOSIS — F3289 Other specified depressive episodes: Secondary | ICD-10-CM | POA: Diagnosis present

## 2013-04-03 DIAGNOSIS — G35 Multiple sclerosis: Principal | ICD-10-CM

## 2013-04-03 HISTORY — DX: Foot drop, right foot: M21.371

## 2013-04-03 HISTORY — DX: Postpartum depression: F53.0

## 2013-04-03 LAB — COMPREHENSIVE METABOLIC PANEL
AST: 14 U/L (ref 0–37)
Albumin: 4.2 g/dL (ref 3.5–5.2)
Alkaline Phosphatase: 93 U/L (ref 39–117)
BUN: 11 mg/dL (ref 6–23)
Creat: 0.62 mg/dL (ref 0.50–1.10)
Glucose, Bld: 87 mg/dL (ref 70–99)
Potassium: 4.1 mEq/L (ref 3.5–5.3)
Total Bilirubin: 0.4 mg/dL (ref 0.3–1.2)

## 2013-04-03 LAB — POCT URINE PREGNANCY: Preg Test, Ur: NEGATIVE

## 2013-04-03 LAB — CBC WITH DIFFERENTIAL/PLATELET
Basophils Relative: 0 % (ref 0–1)
Eosinophils Absolute: 0.2 10*3/uL (ref 0.0–0.7)
HCT: 37.1 % (ref 36.0–46.0)
Hemoglobin: 12.2 g/dL (ref 12.0–15.0)
Lymphs Abs: 3.4 10*3/uL (ref 0.7–4.0)
MCH: 28 pg (ref 26.0–34.0)
MCHC: 32.9 g/dL (ref 30.0–36.0)
Monocytes Absolute: 0.9 10*3/uL (ref 0.1–1.0)
Monocytes Relative: 8 % (ref 3–12)
Neutrophils Relative %: 58 % (ref 43–77)
RBC: 4.35 MIL/uL (ref 3.87–5.11)
RDW: 13.5 % (ref 11.5–15.5)
WBC: 10.8 10*3/uL — ABNORMAL HIGH (ref 4.0–10.5)

## 2013-04-03 LAB — TSH: TSH: 1.62 u[IU]/mL (ref 0.350–4.500)

## 2013-04-03 LAB — CBC
HCT: 39.1 % (ref 36.0–46.0)
Hemoglobin: 13.1 g/dL (ref 12.0–15.0)
MCH: 27.3 pg (ref 26.0–34.0)
MCHC: 33.5 g/dL (ref 30.0–36.0)
MCV: 81.5 fL (ref 78.0–100.0)
RDW: 14.2 % (ref 11.5–15.5)

## 2013-04-03 LAB — BASIC METABOLIC PANEL
BUN: 13 mg/dL (ref 6–23)
CO2: 27 mEq/L (ref 19–32)
Chloride: 106 mEq/L (ref 96–112)
GFR calc non Af Amer: >90 mL/min (ref >90)
Glucose, Bld: 101 mg/dL — ABNORMAL HIGH (ref 70–99)
Potassium: 3.2 mEq/L — ABNORMAL LOW (ref 3.5–5.1)
Sodium: 142 mEq/L (ref 135–145)

## 2013-04-03 MED ORDER — SODIUM CHLORIDE 0.9 % IJ SOLN: 3.0000 mL | INTRAMUSCULAR | Status: DC | PRN

## 2013-04-03 MED ORDER — ALPRAZOLAM 0.5 MG PO TABS
0.5000 mg | ORAL_TABLET | Freq: Once | ORAL | Status: AC
  Administered 2013-04-03: 0.5 mg via ORAL
  Filled 2013-04-03: qty 1

## 2013-04-03 MED ORDER — ESCITALOPRAM OXALATE 10 MG PO TABS: 10.0000 mg | ORAL_TABLET | Freq: Every day | ORAL | Status: DC

## 2013-04-03 MED ORDER — SODIUM CHLORIDE 0.9 % IV SOLN: 250.0000 mL | INTRAVENOUS | Status: DC | PRN

## 2013-04-03 MED ORDER — ACETAMINOPHEN 500 MG PO TABS
1000.0000 mg | ORAL_TABLET | Freq: Once | ORAL | Status: AC
  Administered 2013-04-03: 1000 mg via ORAL
  Filled 2013-04-03: qty 2

## 2013-04-03 MED ORDER — SODIUM CHLORIDE 0.9 % IJ SOLN
3.0000 mL | Freq: Two times a day (BID) | INTRAMUSCULAR | Status: DC
  Administered 2013-04-03 – 2013-04-07 (×8): 3 mL via INTRAVENOUS

## 2013-04-03 MED ORDER — ESCITALOPRAM OXALATE 10 MG PO TABS
10.0000 mg | ORAL_TABLET | Freq: Every day | ORAL | Status: DC
Start: 2013-04-04 — End: 2013-04-04
  Filled 2013-04-03: qty 1

## 2013-04-03 MED ORDER — GADOBENATE DIMEGLUMINE 529 MG/ML IV SOLN
12.0000 mL | Freq: Once | INTRAVENOUS | Status: AC | PRN
  Administered 2013-04-03: 12 mL via INTRAVENOUS

## 2013-04-03 NOTE — Progress Notes (Signed)
Patient ID: NGA RABON, female   DOB: 1991/03/09, 22 y.o.   MRN: [id number] Haley [name] is a 22 year old white female single in for postpartum visit.She had HELLP syndrome and had a C-section at 27+3 weeks.  Delivery Date:01/27/13 Method of Delivery: C-section baby girl, [place] 1 lbs 4.5 oz, in NICU but up to 3+ lbs and doing well  Sexual Activity since delivery: Yes with condom  Method of Feeding: pumping  Number of weeks bleeding post delivery: 2 Reviewed past medical,surgical, social and family history. Reviewed medications and allergies.  Review of Systems: Patient denies any daily headaches, blurred vision, shortness of breath, chest pain, abdominal pain, problems with bowel movements, urination, or intercourse. Teary at times, gets upset if does not see daughter every day, not suicidal, she says her right foot feels heavy and falls out of her flip flop.  Depression Score: 21 BP 110/72  Ht 5\' 1"  (1.549 m)  Wt 130 lb (58.968 kg)  BMI 24.58 kg/m2  LMP 04/03/2013  Breastfeeding? Yes Pelvic Exam:   External genitalia is normal in appearance.  The vagina is normal in appearance with light pink blood. The cervix is bulbous.  Uterus is felt to be normal size, shape, and contour, well involuted.  No adnexal masses or tenderness noted.Incison well healed.CN 2-12 intact, has negative Babinski right foot, DTRs 2-3 + no clonus, can heel toe walk, no low back trigger points, has difficulty curling toes on right , discussed with Dr Emelda Fear Will get IUD after discussing options, while pumping, says she does not want any more babies.  Impression:  Status post delivery, post partum check, Postpartum depresssion, contraceptive management, right foot drop,weakness   Plan:   CT head w/o contrast today at 1:45 pm at [facility], will talk after results Check CBC,CMP,TSH Return in 6 days for IUD insertion,NO SEX Refer to Dr Gerilyn Pilgrim, appt 1/8 at 10 am Rx lexapro 10 mg #30 1 daily with 6 refills,  pump, take then in 3 hours throw next milk away but ok to discuss with lactation person, first

## 2013-04-03 NOTE — Telephone Encounter (Signed)
Xray called Haley [name]'s CT Head was abnormal and needs MR head with contrast now, send to ER to get MR order stat per radiologist, Christa is aware that she needs to go to ER now, and I called ER MD at [facility] and informed him

## 2013-04-03 NOTE — ED Notes (Signed)
Pt given meal tray.

## 2013-04-03 NOTE — ED Notes (Signed)
Pt brought to ed from xray department, had right foot drop for 2 weeks, was seen by [facility] today, and sent for eval.  Pt stated she has been having problems with her vision and "can't focus", able to speak clearly. Ambulatory.

## 2013-04-03 NOTE — ED Provider Notes (Signed)
CSN: [id number]     Arrival date & time 04/03/13  1407 History  This chart was scribed for Donnetta Hutching, MD by Quintella Reichert, ED scribe.  This patient was seen in room APA18/APA18 and the patient's care was started at 3:13 PM.   Chief Complaint  Patient presents with  . right foot drop, sent from ct, ab. scan.     The history is provided by the patient and medical records. No language interpreter was used.    HPI Comments: Haley [name] is a 22 y.o. female who presents to the Emergency Department for an abnormal head CT taken today.  Pt states that for the past 2 weeks she has had a persistent gait abnormality in her right leg which she describes as "doing something weird" and "I can't really control it like I can the other one."  She notes that she has also had some pain to the right ankle.  Pt recently received an emergency C-section for severe preeclampsia on 01/27/2013 and today at a f/u OB/GYN appointment she informed her NP of this issue and was referred to the [facility] x-ray department.  Head CT taken there revealed "bilateral regions of vasogenic edema of indeterminate etiology" and she was advised to return for an MRI.     Past Medical History  Diagnosis Date  . Heart murmur   . Hx of chlamydia infection   . Abnormal Pap smear   . HSV-2 (herpes simplex virus 2) infection   . Pregnant   . Mental disorder     depression,postpartum  . Postpartum depression 04/03/2013  . Right foot drop 04/03/2013    Has - babinski, some muscle weakness  Noted when trying to curl toes    Past Surgical History  Procedure Laterality Date  . Wisdom tooth extraction  2012  . Cesarean section N/A 01/27/2013    Procedure: CESAREAN SECTION;  Surgeon: Catalina Antigua, MD;  Location: [facility];  Service: Obstetrics;  Laterality: N/A;    Family History  Problem Relation Age of Onset  . Cancer Paternal Grandmother     breast  . Anxiety disorder Mother   . Heart disease Mother   . Stroke Paternal  Aunt     History  Substance Use Topics  . Smoking status: Former Smoker    Types: Cigarettes    Quit date: 12/14/2010  . Smokeless tobacco: Never Used  . Alcohol Use: No    OB History   Grav Para Term Preterm Abortions TAB SAB Ect Mult Living   2 2 1 1      2       Review of Systems A complete 10 system review of systems was obtained and all systems are negative except as noted in the HPI and PMH.    Allergies  Penicillins  Home Medications   Current Outpatient Rx  Name  Route  Sig  Dispense  Refill  . escitalopram (LEXAPRO) 10 MG tablet   Oral   Take 1 tablet (10 mg total) by mouth daily.   30 tablet   6   . ferrous sulfate 325 (65 FE) MG tablet   Oral   Take 1 tablet (325 mg total) by mouth daily.   30 tablet   1   . ibuprofen (ADVIL,MOTRIN) 600 MG tablet   Oral   Take 1 tablet (600 mg total) by mouth every 6 (six) hours.   30 tablet   0   . oxyCODONE-acetaminophen (PERCOCET/ROXICET) 5-325 MG per tablet  Oral   Take 1-2 tablets by mouth every 4 (four) hours as needed.   20 tablet   0    BP 131/83  Pulse 95  Temp(Src) 98.8 F (37.1 C) (Oral)  Resp 20  Ht 5\' 1"  (1.549 m)  Wt 130 lb (58.968 kg)  BMI 24.58 kg/m2  SpO2 95%  LMP 04/03/2013  Physical Exam  Nursing note and vitals reviewed. Constitutional: She is oriented to person, place, and time. She appears well-developed and well-nourished.  HENT:  Head: Normocephalic and atraumatic.  Eyes: Conjunctivae and EOM are normal. Pupils are equal, round, and reactive to light.  Neck: Normal range of motion. Neck supple.  Cardiovascular: Normal rate, regular rhythm and normal heart sounds.   Pulmonary/Chest: Effort normal and breath sounds normal.  Abdominal: Soft. Bowel sounds are normal.  nontender  Musculoskeletal:  Dorsiflexion impaired at right ankle  Neurological: She is alert and oriented to person, place, and time.  Skin: Skin is warm and dry.  Pink color  Psychiatric: She has a normal  mood and affect. Her behavior is normal.    ED Course  Procedures (including critical care time)  DIAGNOSTIC STUDIES: Oxygen Saturation is 95% on room air, adequate by my interpretation.    COORDINATION OF CARE: 3:17 PM-Discussed treatment plan which includes brain MRI with pt at bedside and pt agreed to plan.    Labs Review Labs Reviewed - No data to display  Imaging Review Ct Head Wo Contrast  04/03/2013   CLINICAL DATA:  Postpartum with right leg weakness. 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 Postpartum depression.  EXAM: CT HEAD WITHOUT CONTRAST  TECHNIQUE: Contiguous axial images were obtained from the base of the skull through the vertex without intravenous contrast.  COMPARISON:  None.  FINDINGS: Low-density regions (vasogenic edema) predominantly subcortical white matter involving portions of the frontal lobes (greater on the left) and the left posterior temporal lobe/posterior left operculum/ periatrial region. Etiology indeterminate. This may be related to infection, inflammatory process or demyelinating process. Result of posterior reversible encephalopathy syndrome/ eclampsia not excluded although patient's delivery date was 01/27/2013. Multi focal infarction or tumor felt unlikely although not excluded. Result of venous infarct felt to be less likely consideration.  No intracranial hemorrhage.  No hydrocephalus.  IMPRESSION: Bilateral regions of vasogenic edema of indeterminate etiology. MR of the brain with contrast recommended. Please see above discussion.  These results were called by telephone at the time of interpretation on 04/03/2013 at 2:00 PM to [facility] GRIFFIN physician's assistant, who verbally acknowledged these results. Patient is not acting infected per phone conversation. Patient is being scheduled for contrast enhanced MR of the brain.   Electronically Signed   By: Bridgett Larsson M.D.   On: 04/03/2013 14:14    EKG Interpretation   None        MDM  No diagnosis found. MRI scan suggests multiple sclerosis.  Discussed diagnosis with family. Also discussed with neurologist Dr. Gerilyn Pilgrim.  I asked if patient should receive steroids. Consult recommended waiting until he saw her.  Also discussed with hospitalist Dr. Sharl Ma    I personally performed the services described in this documentation, which was scribed in my presence. The recorded information has been reviewed and is accurate.    Donnetta Hutching, MD 04/03/13 2044

## 2013-04-03 NOTE — ED Notes (Signed)
Paged [place] [phone] again

## 2013-04-03 NOTE — Lactation Note (Signed)
This note was copied from the chart of Haley [name]. Lactation Consultation Note     Follow up consult with this mom of a NICU baby, now 2 months old, and 36 6/[redacted] weeks gestation. Mom called from [facility], where she was going in for an MRI. She wanted to know if it was safe to a XANAX, and if so, how long should she pump and dump. I told her it would not be necessary to dump at all for one dose, but once should sufice if that makes her feel better. Mom was also started on Lexapro, which I told her was safe to take with breast feeding. I will follow this mom and baby in the NICU.  Patient Name: Haley [name] ZOXWR'U Date: 04/03/2013 Reason for consult: Follow-up assessment;NICU baby   Maternal Data    Feeding Feeding Type: Breast Milk Length of feed: 45 min  LATCH Score/Interventions                      Lactation Tools Discussed/Used     Consult Status Consult Status: Follow-up Follow-up type:  (prn in NICU)    Alfred Levins 04/03/2013, 3:56 PM

## 2013-04-03 NOTE — Patient Instructions (Signed)
CT today at 1:45 at [facility] NO sex Return in 6 days for IUD Start lexapro ok to talk with lactation person fist See Dr Gerilyn Pilgrim, 1/8 at 10 am Postpartum Depression and Baby Blues The postpartum period begins right after the birth of a baby. During this time, there is often a great amount of joy and excitement. It is also a time of considerable changes in the life of the parent(s). Regardless of how many times a mother gives birth, each child brings new challenges and dynamics to the family. It is not unusual to have feelings of excitement accompanied by confusing shifts in moods, emotions, and thoughts. All mothers are at risk of developing postpartum depression or the "baby blues." These mood changes can occur right after giving birth, or they may occur many months after giving birth. The baby blues or postpartum depression can be mild or severe. Additionally, postpartum depression can resolve rather quickly, or it can be a long-term condition. CAUSES Elevated hormones and their rapid decline are thought to be a main cause of postpartum depression and the baby blues. There are a number of hormones that radically change during and after pregnancy. Estrogen and progesterone usually decrease immediately after delivering your baby. The level of thyroid hormone and various cortisol steroids also rapidly drop. Other factors that play a major role in these changes include major life events and genetics.  RISK FACTORS If you have any of the following risks for the baby blues or postpartum depression, know what symptoms to watch out for during the postpartum period. Risk factors that may increase the likelihood of getting the baby blues or postpartum depression include:  Havinga personal or family history of depression.  Having depression while being pregnant.  Having premenstrual or oral contraceptive-associated mood issues.  Having exceptional life stress.  Having marital conflict.  Lacking a  social support network.  Having a baby with special needs.  Having health problems such as diabetes. SYMPTOMS Baby blues symptoms include:  Brief fluctuations in mood, such as going from extreme happiness to sadness.  Decreased concentration.  Difficulty sleeping.  Crying spells, tearfulness.  Irritability.  Anxiety. Postpartum depression symptoms typically begin within the first month after giving birth. These symptoms include:  Difficulty sleeping or excessive sleepiness.  Marked weight loss.  Agitation.  Feelings of worthlessness.  Lack of interest in activity or food. Postpartum psychosis is a very concerning condition and can be dangerous. Fortunately, it is rare. Displaying any of the following symptoms is cause for immediate medical attention. Postpartum psychosis symptoms include:  Hallucinations and delusions.  Bizarre or disorganized behavior.  Confusion or disorientation. DIAGNOSIS  A diagnosis is made by an evaluation of your symptoms. There are no medical or lab tests that lead to a diagnosis, but there are various questionnaires that a caregiver may use to identify those with the baby blues, postpartum depression, or psychosis. Often times, a screening tool called the [place] Postnatal Depression Scale is used to diagnose depression in the postpartum period.  TREATMENT The baby blues usually goes away on its own in 1 to 2 weeks. Social support is often all that is needed. You should be encouraged to get adequate sleep and rest. Occasionally, you may be given medicines to help you sleep.  Postpartum depression requires treatment as it can last several months or longer if it is not treated. Treatment may include individual or group therapy, medicine, or both to address any social, physiological, and psychological factors that may play a  role in the depression. Regular exercise, a healthy diet, rest, and social support may also be strongly recommended.   Postpartum psychosis is more serious and needs treatment right away. Hospitalization is often needed. HOME CARE INSTRUCTIONS  Get as much rest as you can. Nap when the baby sleeps.  Exercise regularly. Some women find yoga and walking to be beneficial.  Eat a balanced and nourishing diet.  Do little things that you enjoy. Have a cup of tea, take a bubble bath, read your favorite magazine, or listen to your favorite music.  Avoid alcohol.  Ask for help with household chores, cooking, grocery shopping, or running errands as needed. Do not try to do everything.  Talk to people close to you about how you are feeling. Get support from your partner, family members, friends, or other new moms.  Try to stay positive in how you think. Think about the things you are grateful for.  Do not spend a lot of time alone.  Only take medicine as directed by your caregiver.  Keep all your postpartum appointments.  Let your caregiver know if you have any concerns. SEEK MEDICAL CARE IF: You are having a reaction or problems with your medicine. SEEK IMMEDIATE MEDICAL CARE IF:  You have suicidal feelings.  You feel you may harm the baby or someone else. Document Released: 02/03/2004 Document Revised: 07/24/2011 Document Reviewed: 03/07/2011 [facility] Patient Information 2014 [place], [place].

## 2013-04-03 NOTE — H&P (Signed)
PCP:   Provider Not In System   Chief Complaint:  Right foot drop  HPI: 22 year old female who presented to the ED after patient had abnormal head CT done at outpatient imaging center. Patient recently received emergency C-section for severe preeclampsia on 01/27/2013 and today had a followup appointment with her OB/GYN. Patient complained about persistent gait abnormality in the right leg which has been going on for 2 weeks. She was sent for imaging as outpatient the head CT showed bilateral regions of vasogenic edema of indeterminate etiology and she was sent to the [facility]  ED for an MRI. In the ED MRI brain was done which showed years of vasogenic edema and several areas suggest active demyelination suspicious for multiple sclerosis.  Allergies:   Allergies  Allergen Reactions  . Penicillins Other (See Comments)    Unknown childhood reaction      Past Medical History  Diagnosis Date  . Heart murmur   . Hx of chlamydia infection   . Abnormal Pap smear   . HSV-2 (herpes simplex virus 2) infection   . Pregnant   . Mental disorder     depression,postpartum  . Postpartum depression 04/03/2013  . Right foot drop 04/03/2013    Has - babinski, some muscle weakness  Noted when trying to curl toes    Past Surgical History  Procedure Laterality Date  . Wisdom tooth extraction  2012  . Cesarean section N/A 01/27/2013    Procedure: CESAREAN SECTION;  Surgeon: Catalina Antigua, MD;  Location: [facility];  Service: Obstetrics;  Laterality: N/A;    Prior to Admission medications   Medication Sig Start Date End Date Taking? Authorizing Provider  ferrous sulfate 325 (65 FE) MG tablet Take 325 mg by mouth daily with breakfast.   Yes Historical Provider, MD  escitalopram (LEXAPRO) 10 MG tablet Take 1 tablet (10 mg total) by mouth daily. 04/03/13   Adline Potter, NP    Social History:  reports that she quit smoking about 2 years ago. Her smoking use included Cigarettes. She smoked 0.00  packs per day. She has never used smokeless tobacco. She reports that she does not drink alcohol or use illicit drugs.  Family History  Problem Relation Age of Onset  . Cancer Paternal Grandmother     breast  . Anxiety disorder Mother   . Heart disease Mother   . Stroke Paternal Aunt      All the positives are listed in BOLD  Review of Systems:  HEENT: Headache, blurred vision, runny nose, sore throat Neck: Hypothyroidism, hyperthyroidism,,lymphadenopathy Chest : Shortness of breath, history of COPD, Asthma Heart : Chest pain, history of coronary arterey disease GI:  Nausea, vomiting, diarrhea, constipation, GERD GU: Dysuria, urgency, frequency of urination, hematuria Neuro: Stroke, seizures, syncope Psych: Depression, anxiety, hallucinations   Physical Exam: Blood pressure 131/83, pulse 95, temperature 98.8 F (37.1 C), temperature source Oral, resp. rate 20, height 5\' 1"  (1.549 m), weight 58.968 kg (130 lb), last menstrual period 04/03/2013, SpO2 95.00%. Constitutional:   Patient is a well-developed and well-nourished *female in no acute distress and cooperative with exam. Head: Normocephalic and atraumatic Mouth: Mucus membranes moist Eyes: PERRL, EOMI, conjunctivae normal Neck: Supple, No Thyromegaly Cardiovascular: RRR, S1 normal, S2 normal Pulmonary/Chest: CTAB, no wheezes, rales, or rhonchi Abdominal: Soft. Non-tender, non-distended, bowel sounds are normal, no masses, organomegaly, or guarding present.  Neurological: A&O x3, Strenght is normal and symmetric bilaterally, cranial nerve II-XII are grossly intact, mild weakness of the right foot,  sensory intact to light touch bilaterally.  Extremities : No Cyanosis, Clubbing or Edema   Labs on Admission:  Results for orders placed in visit on 04/03/13 (from the past 48 hour(s))  POCT URINE PREGNANCY     Status: None   Collection Time    04/03/13 10:28 AM      Result Value Range   Preg Test, Ur Negative    CBC      Status: None   Collection Time    04/03/13 11:45 AM      Result Value Range   WBC 9.5  4.0 - 10.5 K/uL   RBC 4.80  3.87 - 5.11 MIL/uL   Hemoglobin 13.1  12.0 - 15.0 g/dL   HCT 16.1  09.6 - 04.5 %   MCV 81.5  78.0 - 100.0 fL   MCH 27.3  26.0 - 34.0 pg   MCHC 33.5  30.0 - 36.0 g/dL   RDW 40.9  81.1 - 91.4 %   Platelets 332  150 - 400 K/uL  COMPREHENSIVE METABOLIC PANEL     Status: None   Collection Time    04/03/13 11:45 AM      Result Value Range   Sodium    135 - 145 mEq/L   Potassium    3.5 - 5.3 mEq/L   Chloride    96 - 112 mEq/L   CO2    19 - 32 mEq/L   Glucose, Bld    70 - 99 mg/dL   BUN    6 - 23 mg/dL   Creat    7.82 - 9.56 mg/dL   Total Bilirubin    0.3 - 1.2 mg/dL   Alkaline Phosphatase    39 - 117 U/L   AST    0 - 37 U/L   ALT    0 - 53 U/L   Total Protein    6.0 - 8.3 g/dL   Albumin    3.5 - 5.2 g/dL   Calcium    8.4 - 21.3 mg/dL  TSH     Status: None   Collection Time    04/03/13 11:45 AM      Result Value Range   TSH    0.350 - 4.500 uIU/mL    Radiological Exams on Admission: Ct Head Wo Contrast  04/03/2013   CLINICAL DATA:  Postpartum with right leg weakness. 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 Postpartum depression.  EXAM: CT HEAD WITHOUT CONTRAST  TECHNIQUE: Contiguous axial images were obtained from the base of the skull through the vertex without intravenous contrast.  COMPARISON:  None.  FINDINGS: Low-density regions (vasogenic edema) predominantly subcortical white matter involving portions of the frontal lobes (greater on the left) and the left posterior temporal lobe/posterior left operculum/ periatrial region. Etiology indeterminate. This may be related to infection, inflammatory process or demyelinating process. Result of posterior reversible encephalopathy syndrome/ eclampsia not excluded although patient's delivery date was 01/27/2013. Multi focal infarction or tumor felt unlikely although not excluded. Result of venous infarct  felt to be less likely consideration.  No intracranial hemorrhage.  No hydrocephalus.  IMPRESSION: Bilateral regions of vasogenic edema of indeterminate etiology. MR of the brain with contrast recommended. Please see above discussion.  These results were called by telephone at the time of interpretation on 04/03/2013 at 2:00 PM to [facility] GRIFFIN physician's assistant, who verbally acknowledged these results. Patient is not acting infected per phone conversation. Patient is being scheduled for contrast enhanced MR of the  brain.   Electronically Signed   By: Bridgett Larsson M.D.   On: 04/03/2013 14:14   Mr Laqueta Jean WU Contrast  04/03/2013   CLINICAL DATA:  Two months postpartum presenting with right foot drop. Abnormal CT. History of HELLP syndrome during pregnancy.  EXAM: MRI HEAD WITHOUT AND WITH CONTRAST  TECHNIQUE: Multiplanar, multiecho pulse sequences of the brain and surrounding structures were obtained without and with intravenous contrast.  CONTRAST:  12mL MULTIHANCE GADOBENATE DIMEGLUMINE 529 MG/ML IV SOLN  COMPARISON:  04/03/2013 head CT.  No comparison brain MR.  FINDINGS: Numerous intracranial lesions many which demonstrate enhancement involving supratentorial and infratentorial region. Index enhancing lesion within the posterior medial left frontal lobe measures 2 x 1.2 x 1.1 cm with marked surrounding vasogenic edema. White matter lesions extend into the corpus callosum. Findings in a patient of this age and gender are highly suspicious for demyelinating process and in particular multiple sclerosis. Acute disseminated encephalomyelitis (ADEM) cannot be excluded although a secondary consideration given the fact that some lesions enhance and others do not enhance.  Infectious process could not be excluded in the proper clinical setting. On the diffusion-weighted sequence, majority of the lesions do not demonstrate restricted motion in a distribution suspicious or abscesses.  Metastatic disease could not  be excluded in the proper clinical setting.  Result of inflammatory process/vasculitis although not entirely excluded are felt to be less likely considerations.  Appearance is not typical for PRES syndrome or venous infarction.  No acute thrombotic infarct.  No abnormality noted involving the orbits or optic nerve.  Pituitary gland without evidence of hemorrhage or significant enlargement.  Prominent adenoidal tissue may be an incidental finding.  Decreased signal intensity of bone marrow may reflect result of anemia.  No hydrocephalus.  No intracranial hemorrhage.  Major intracranial vascular structures are patent.  On the series 9 image 16 the cervical cord appears slightly thickened at the C4 level which may reflect involvement by demyelinating process but is not adequately imaged on the present exam.  IMPRESSION: Markedly abnormal examination as detailed above suspicious for multiple sclerosis with other considerations as detailed above. This will require clinical and laboratory correlation to confirm. Other potential causes as noted above will need to be considered and excluded.  Several of the white matter lesions has surrounding vasogenic edema including medial aspect of the posterior left frontal lobe which may contribute to patient's foot drop. Other areas of vasogenic edema include right frontal lobe, left periatrial region and posterior left operculum region as well temporal lobe bilaterally. Several of these areas enhance suggesting active demyelination. Involvement is more notable in supratentorial region although there is posterior fossa involvement.  Involvement of the cervical cord C4 level may be present but incompletely assessed.  These results were called by telephone at the time of interpretation on 04/03/2013 at 5:38 PM to Dr. Juleen China, who verbally acknowledged these results.   Electronically Signed   By: Bridgett Larsson M.D.   On: 04/03/2013 17:52    Assessment/Plan Principal Problem:   Right  foot drop Active Problems:   2014 Pregnancy w/ severe pre-e/HELLP, c/s @ 27wks   Postpartum depression  22 year old female with history of severe preeclampsia requiring emergency cesarean section at 27 weeks, now presenting with right foot drop with MRI brain showing findings consistent with multiple sclerosis. I called and discussed with the neurologist on call Dr Gerilyn Pilgrim, and he recommends to admit the patient and he will follow up in the morning. He does not recommend  to give any steroids at this time as patient's symptoms have been going on for past 2 weeks. We'll admit the patient and obtain neurochecks every 2 hours. We'll continue Lexapro which she has been taking for postpartum depression.  Code status: Presumed full code  Family discussion: Discussed with mother, father, fianc at bedside   Time Spent on Admission: 60 min  [facility] Pager: [phone] 04/03/2013, 8:56 PM  If 7PM-7AM, please contact night-coverage  [email or web address]  Password [email or web address]

## 2013-04-03 NOTE — ED Notes (Signed)
At bedside with EDP to give pt MRI results.

## 2013-04-04 ENCOUNTER — Inpatient Hospital Stay (HOSPITAL_COMMUNITY): Payer: Medicaid Other

## 2013-04-04 DIAGNOSIS — O9089 Other complications of the puerperium, not elsewhere classified: Secondary | ICD-10-CM

## 2013-04-04 LAB — COMPREHENSIVE METABOLIC PANEL
Alkaline Phosphatase: 92 U/L (ref 39–117)
BUN: 13 mg/dL (ref 6–23)
CO2: 25 mEq/L (ref 19–32)
Chloride: 104 mEq/L (ref 96–112)
GFR calc Af Amer: >90 mL/min (ref >90)
GFR calc non Af Amer: >90 mL/min (ref >90)
Glucose, Bld: 104 mg/dL — ABNORMAL HIGH (ref 70–99)
Potassium: 3.2 mEq/L — ABNORMAL LOW (ref 3.5–5.1)
Total Bilirubin: 0.2 mg/dL — ABNORMAL LOW (ref 0.3–1.2)

## 2013-04-04 LAB — CBC
HCT: 39 % (ref 36.0–46.0)
Hemoglobin: 12.5 g/dL (ref 12.0–15.0)
MCHC: 32.1 g/dL (ref 30.0–36.0)
Platelets: 270 10*3/uL (ref 150–400)
RBC: 4.5 MIL/uL (ref 3.87–5.11)
WBC: 9.2 10*3/uL (ref 4.0–10.5)

## 2013-04-04 LAB — CSF CELL COUNT WITH DIFFERENTIAL
Lymphs, CSF: 78 % (ref 40–80)
RBC Count, CSF: 3 /mm3 — ABNORMAL HIGH
Segmented Neutrophils-CSF: 0 % (ref 0–6)
Tube #: 3
WBC, CSF: 13 /mm3 (ref 0–5)

## 2013-04-04 MED ORDER — SODIUM CHLORIDE 0.9 % IV SOLN
1000.0000 mg | INTRAVENOUS | Status: DC
  Administered 2013-04-04 – 2013-04-07 (×4): 1000 mg via INTRAVENOUS
  Filled 2013-04-04 (×7): qty 8

## 2013-04-04 MED ORDER — ALPRAZOLAM 0.25 MG PO TABS
0.2500 mg | ORAL_TABLET | Freq: Two times a day (BID) | ORAL | Status: DC | PRN
  Administered 2013-04-04 – 2013-04-07 (×6): 0.25 mg via ORAL
  Filled 2013-04-04 (×8): qty 1

## 2013-04-04 MED ORDER — ACETAMINOPHEN 500 MG PO TABS
1000.0000 mg | ORAL_TABLET | Freq: Three times a day (TID) | ORAL | Status: DC | PRN
  Administered 2013-04-04 – 2013-04-07 (×7): 1000 mg via ORAL
  Filled 2013-04-04 (×8): qty 2

## 2013-04-04 MED ORDER — ALPRAZOLAM 0.5 MG PO TABS: 0.5000 mg | ORAL_TABLET | Freq: Two times a day (BID) | ORAL | Status: DC | PRN

## 2013-04-04 NOTE — Care Management Note (Signed)
    Page 1 of 1   04/04/2013     2:10:12 PM   CARE MANAGEMENT NOTE 04/04/2013  Patient:  Haley [name], Haley [name]   Account Number:  [id number]  Date Initiated:  04/04/2013  Documentation initiated by:  Rosemary Holms  Subjective/Objective Assessment:   Pt lives at home. Has a baby in the NICU currently 37 weeks. No HH needs identified with pt and significant other.     Action/Plan:   Anticipated DC Date:  04/05/2013   Anticipated DC Plan:  HOME/SELF CARE      DC Planning Services  CM consult      Choice offered to / List presented to:             Status of service:  Completed, signed off Medicare Important Message given?   (If response is "NO", the following Medicare IM given date fields will be blank) Date Medicare IM given:   Date Additional Medicare IM given:    Discharge Disposition:    Per UR Regulation:    If discussed at Long Length of Stay Meetings, dates discussed:    Comments:  04/04/13 Rosemary Holms RN BSN CM

## 2013-04-04 NOTE — Progress Notes (Signed)
  Echocardiogram 2D Echocardiogram has been performed.  Nestor Ramp M 04/04/2013, 10:30 AM

## 2013-04-04 NOTE — Progress Notes (Signed)
[facility] PROGRESS NOTE  Haley [name] GNF:[id number] DOB: June 05, 1990 DOA: 04/03/2013 PCP: Provider Not In System  Assessment/Plan: 1. Right foot drop with abnormal MRI brain.  Differential diagnosis is broad including multiple sclerosis, toxoplasmosis, cryptococcal infection, and possible lymphoma.  Neurology has seen the patient and has ordered lumbar puncture and an extensive work up.  Since her symptoms have been occuring for approximately 2 weeks now, neurology has not recommended starting empiric steroids at this time until further etiology of findings can be narrowed. Continue supportive treatment for now and await labs to return.  2. Postpartum depression.  Patient does not wish to take lexapro since she is breast feeding.  Denies any suicidal/homicidal ideations at this time.   Code Status: full code Family Communication: discussed with patient and family members at the bedside Disposition Plan: discharge home once improved   Consultants:  Neurology  Procedures:  Lumbar puncture by radiology on 11/21  Antibiotics:  none  HPI/Subjective: No new complaints, no pain.  Underwent lumbar puncture earlier today  Objective: Filed Vitals:   04/04/13 1454  BP: 129/73  Pulse: 81  Temp: 98.7 F (37.1 C)  Resp: 20    Intake/Output Summary (Last 24 hours) at 04/04/13 1535 Last data filed at 04/04/13 0800  Gross per 24 hour  Intake    240 ml  Output      1 ml  Net    239 ml   Filed Weights   04/03/13 1421 04/03/13 2200  Weight: 58.968 kg (130 lb) 59.7 kg (131 lb 9.8 oz)    Exam:   General:  NAD  Cardiovascular: s1, s2, rrr  Respiratory: cta b  Abdomen: soft, nt, nd, bs+  Musculoskeletal: no edema b/l  Neurologic: dorsiflexion at right ankle is 2-3/5.    Data Reviewed: Basic Metabolic Panel:  Recent Labs Lab 04/03/13 1145 04/03/13 2056 04/04/13 0604  NA 142 142 141  K 4.1 3.2* 3.2*  CL 104 106 104  CO2 25 27 25   GLUCOSE 87 101* 104*   BUN 11 13 13   CREATININE 0.62 0.72 0.59  CALCIUM 9.6 9.1 10.1   Liver Function Tests:  Recent Labs Lab 04/03/13 1145 04/04/13 0604  AST 14 13  ALT 10 9  ALKPHOS 93 92  BILITOT 0.4 0.2*  PROT 6.5 6.7  ALBUMIN 4.2 3.4*   No results found for this basename: LIPASE, AMYLASE,  in the last 168 hours No results found for this basename: AMMONIA,  in the last 168 hours CBC:  Recent Labs Lab 04/03/13 1145 04/03/13 2056 04/04/13 0604  WBC 9.5 10.8* 9.2  NEUTROABS  --  6.2  --   HGB 13.1 12.2 12.5  HCT 39.1 37.1 39.0  MCV 81.5 85.3 86.7  PLT 332 278 270   Cardiac Enzymes: No results found for this basename: CKTOTAL, CKMB, CKMBINDEX, TROPONINI,  in the last 168 hours BNP (last 3 results) No results found for this basename: PROBNP,  in the last 8760 hours CBG: No results found for this basename: GLUCAP,  in the last 168 hours  Recent Results (from the past 240 hour(s))  CSF CULTURE     Status: None   Collection Time    04/04/13 11:21 AM      Result Value Range Status   Specimen Description CSF   Final   Special Requests NONE   Final   Gram Stain     Final   Value: FEW WBC PRESENT, PREDOMINANTLY MONONUCLEAR     NO ORGANISMS  SEEN     Gram Stain Report Called to,Read Back By and Verified With: A. [name] AT 1447 ON 04/04/13 BY Wynonia Lawman   Culture PENDING   Incomplete   Report Status PENDING   Incomplete     Studies: Ct Head Wo Contrast  04/03/2013   CLINICAL DATA:  Postpartum with right leg weakness. 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 Postpartum depression.  EXAM: CT HEAD WITHOUT CONTRAST  TECHNIQUE: Contiguous axial images were obtained from the base of the skull through the vertex without intravenous contrast.  COMPARISON:  None.  FINDINGS: Low-density regions (vasogenic edema) predominantly subcortical white matter involving portions of the frontal lobes (greater on the left) and the left posterior temporal lobe/posterior left operculum/  periatrial region. Etiology indeterminate. This may be related to infection, inflammatory process or demyelinating process. Result of posterior reversible encephalopathy syndrome/ eclampsia not excluded although patient's delivery date was 01/27/2013. Multi focal infarction or tumor felt unlikely although not excluded. Result of venous infarct felt to be less likely consideration.  No intracranial hemorrhage.  No hydrocephalus.  IMPRESSION: Bilateral regions of vasogenic edema of indeterminate etiology. MR of the brain with contrast recommended. Please see above discussion.  These results were called by telephone at the time of interpretation on 04/03/2013 at 2:00 PM to [facility] GRIFFIN physician's assistant, who verbally acknowledged these results. Patient is not acting infected per phone conversation. Patient is being scheduled for contrast enhanced MR of the brain.   Electronically Signed   By: Bridgett Larsson M.D.   On: 04/03/2013 14:14   Mr Laqueta Jean ZO Contrast  04/03/2013   CLINICAL DATA:  Two months postpartum presenting with right foot drop. Abnormal CT. History of HELLP syndrome during pregnancy.  EXAM: MRI HEAD WITHOUT AND WITH CONTRAST  TECHNIQUE: Multiplanar, multiecho pulse sequences of the brain and surrounding structures were obtained without and with intravenous contrast.  CONTRAST:  12mL MULTIHANCE GADOBENATE DIMEGLUMINE 529 MG/ML IV SOLN  COMPARISON:  04/03/2013 head CT.  No comparison brain MR.  FINDINGS: Numerous intracranial lesions many which demonstrate enhancement involving supratentorial and infratentorial region. Index enhancing lesion within the posterior medial left frontal lobe measures 2 x 1.2 x 1.1 cm with marked surrounding vasogenic edema. White matter lesions extend into the corpus callosum. Findings in a patient of this age and gender are highly suspicious for demyelinating process and in particular multiple sclerosis. Acute disseminated encephalomyelitis (ADEM) cannot be excluded  although a secondary consideration given the fact that some lesions enhance and others do not enhance.  Infectious process could not be excluded in the proper clinical setting. On the diffusion-weighted sequence, majority of the lesions do not demonstrate restricted motion in a distribution suspicious or abscesses.  Metastatic disease could not be excluded in the proper clinical setting.  Result of inflammatory process/vasculitis although not entirely excluded are felt to be less likely considerations.  Appearance is not typical for PRES syndrome or venous infarction.  No acute thrombotic infarct.  No abnormality noted involving the orbits or optic nerve.  Pituitary gland without evidence of hemorrhage or significant enlargement.  Prominent adenoidal tissue may be an incidental finding.  Decreased signal intensity of bone marrow may reflect result of anemia.  No hydrocephalus.  No intracranial hemorrhage.  Major intracranial vascular structures are patent.  On the series 9 image 16 the cervical cord appears slightly thickened at the C4 level which may reflect involvement by demyelinating process but is not adequately imaged on the present exam.  IMPRESSION: Markedly abnormal examination as detailed above suspicious for multiple sclerosis with other considerations as detailed above. This will require clinical and laboratory correlation to confirm. Other potential causes as noted above will need to be considered and excluded.  Several of the white matter lesions has surrounding vasogenic edema including medial aspect of the posterior left frontal lobe which may contribute to patient's foot drop. Other areas of vasogenic edema include right frontal lobe, left periatrial region and posterior left operculum region as well temporal lobe bilaterally. Several of these areas enhance suggesting active demyelination. Involvement is more notable in supratentorial region although there is posterior fossa involvement.   Involvement of the cervical cord C4 level may be present but incompletely assessed.  These results were called by telephone at the time of interpretation on 04/03/2013 at 5:38 PM to Dr. Juleen China, who verbally acknowledged these results.   Electronically Signed   By: Bridgett Larsson M.D.   On: 04/03/2013 17:52    Scheduled Meds: . sodium chloride  3 mL Intravenous Q12H   Continuous Infusions:   Principal Problem:   Right foot drop Active Problems:   2014 Pregnancy w/ severe pre-e/HELLP, c/s @ 27wks   Postpartum depression    Time spent:    [facility]  [facility] Pager [phone]. If 7PM-7AM, please contact night-coverage at [email or web address], password [facility] 04/04/2013, 3:35 PM  LOS: 1 day

## 2013-04-04 NOTE — Progress Notes (Signed)
Patient complaining of headache.  No orders for PRN pain medication.  Dr. Kerry Hough notified via text page.

## 2013-04-04 NOTE — Consult Note (Signed)
[facility] Nakai Yard A. Gerilyn Pilgrim, MD     [email or web address]          Haley [name] is an 22 y.o. female.   ASSESSMENT/PLAN:   1. Subacute right foot drop with extensive white matter lesions with many of these enhancing. The differential diagnosis includes lymphoma, toxoplasmosis, cryptococcal infection, and multiple sclerosis. The patient will undergo a rather extensive workup. A spinal tap will be arranged. Spinal tap will include the following: Multiple sclerosis panel, VDRL, flow cytometry and cytology, cryptococcal antigen and routine culture in addition to other routine tests. Extensive labs will be obtained. These includes HIV, RPR, ANA, sedimentation rate and vitamin B12. An echocardiography will also be obtained to evaluate for possible embolic phenomena particularly due to vegetations. At this time I will not start steroids until we have a better picture exactly what the underlying cause is. This was discussed at length with the patient and her significant other.   The patient is a 22 year old white female who delivered a child about 2 months ago in September. The patient developed abdominal pain around the epigastrium and went for further evaluation. She was noted to be quite hypertensive 200/130. She had elevated liver enzymes and low platelets. She was diagnosed with severe preeclampsia and the HELLP syndrome. She subsequently underwent cesarean section for delivery of a child. The child is doing well. The patient did develop a right foot drop about 2 weeks ago. It has not gotten any better and she discussed this during a routine follow-up with her primary care oncologist. A CT scan was obtained because of apparently upgoing toe. The CT scan was grossly abnormal and a MRI scan was consequently obtained. This showed growth at [facility] and hence the evaluation and admission. The patient does not report having headaches. She does not report other symptoms at this time. There is no chest  pain, shortness of breath, GI GU symptoms. The review of systems is essentially unremarkable. The patient is currently breast-feeding.   GENERAL: This a pleasant female who is in no acute distress.  HEENT: Neck is supple head is normocephalic atraumatic.  ABDOMEN: soft  EXTREMITIES: No edema   BACK: Unremarkable.  SKIN: Normal by inspection.    MENTAL STATUS: Alert and oriented. Speech, language and cognition are generally intact. Judgment and insight normal.   CRANIAL NERVES: Pupils are equal, round and reactive to light and accommodation; extra ocular movements are full, there is no significant nystagmus; visual fields are full; upper and lower facial muscles are normal in strength and symmetric, there is no flattening of the nasolabial folds; tongue is midline; uvula is midline; shoulder elevation is normal.  MOTOR: Right hip flexion is 5 but dorsiflexion is 3/5. The other extremities shows normal tone and bulk and strength.  COORDINATION: Left finger to nose is normal, right finger to nose is normal, No rest tremor; no intention tremor; no postural tremor; no bradykinesia.  REFLEXES: Deep tendon reflexes are symmetrical and normal in the upper extremities. However, they are pathologically brisk in the legs bilaterally. Plantar responses are flexor bilaterally.   SENSATION: Normal to light touch, temperature, and pinprick.  GAIT: Normal.    Past Medical History  Diagnosis Date  . Heart murmur   . Hx of chlamydia infection   . Abnormal Pap smear   . HSV-2 (herpes simplex virus 2) infection   . Pregnant   . Mental disorder     depression,postpartum  . Postpartum depression 04/03/2013  . Right foot drop 04/03/2013  Has - babinski, some muscle weakness  Noted when trying to curl toes    Past Surgical History  Procedure Laterality Date  . Wisdom tooth extraction  2012  . Cesarean section N/A 01/27/2013    Procedure: CESAREAN SECTION;  Surgeon: Catalina Antigua, MD;   Location: [facility];  Service: Obstetrics;  Laterality: N/A;    Family History  Problem Relation Age of Onset  . Cancer Paternal Grandmother     breast  . Anxiety disorder Mother   . Heart disease Mother   . Stroke Paternal Aunt     Social History:  reports that she quit smoking about 2 years ago. Her smoking use included Cigarettes. She smoked 0.00 packs per day. She has never used smokeless tobacco. She reports that she does not drink alcohol or use illicit drugs.  Allergies:  Allergies  Allergen Reactions  . Penicillins Other (See Comments)    Unknown childhood reaction    Medications: Prior to Admission medications   Medication Sig Start Date End Date Taking? Authorizing Provider  ferrous sulfate 325 (65 FE) MG tablet Take 325 mg by mouth daily with breakfast.   Yes Historical Provider, MD  escitalopram (LEXAPRO) 10 MG tablet Take 1 tablet (10 mg total) by mouth daily. 04/03/13   Adline Potter, NP    Scheduled Meds: . escitalopram  10 mg Oral Daily  . sodium chloride  3 mL Intravenous Q12H   Continuous Infusions:  PRN Meds:.sodium chloride, sodium chloride    Blood pressure 107/63, pulse 67, temperature 97.2 F (36.2 C), temperature source Oral, resp. rate 20, height 5\' 1"  (1.549 m), weight 59.7 kg (131 lb 9.8 oz), last menstrual period 04/03/2013, SpO2 99.00%, currently breastfeeding.   Results for orders placed during the hospital encounter of 04/03/13 (from the past 48 hour(s))  BASIC METABOLIC PANEL     Status: Abnormal   Collection Time    04/03/13  8:56 PM      Result Value Range   Sodium 142  135 - 145 mEq/L   Potassium 3.2 (*) 3.5 - 5.1 mEq/L   Chloride 106  96 - 112 mEq/L   CO2 27  19 - 32 mEq/L   Glucose, Bld 101 (*) 70 - 99 mg/dL   BUN 13  6 - 23 mg/dL   Creatinine, Ser 1.61  0.50 - 1.10 mg/dL   Calcium 9.1  8.4 - 09.6 mg/dL   GFR calc non Af Amer >90  >90 mL/min   GFR calc Af Amer >90  >90 mL/min   Comment: (NOTE)     The eGFR has been  calculated using the CKD EPI equation.     This calculation has not been validated in all clinical situations.     eGFR's persistently <90 mL/min signify possible Chronic Kidney     Disease.  CBC WITH DIFFERENTIAL     Status: Abnormal   Collection Time    04/03/13  8:56 PM      Result Value Range   WBC 10.8 (*) 4.0 - 10.5 K/uL   RBC 4.35  3.87 - 5.11 MIL/uL   Hemoglobin 12.2  12.0 - 15.0 g/dL   HCT 04.5  40.9 - 81.1 %   MCV 85.3  78.0 - 100.0 fL   MCH 28.0  26.0 - 34.0 pg   MCHC 32.9  30.0 - 36.0 g/dL   RDW 91.4  78.2 - 95.6 %   Platelets 278  150 - 400 K/uL   Neutrophils Relative % 58  43 - 77 %   Neutro Abs 6.2  1.7 - 7.7 K/uL   Lymphocytes Relative 32  12 - 46 %   Lymphs Abs 3.4  0.7 - 4.0 K/uL   Monocytes Relative 8  3 - 12 %   Monocytes Absolute 0.9  0.1 - 1.0 K/uL   Eosinophils Relative 2  0 - 5 %   Eosinophils Absolute 0.2  0.0 - 0.7 K/uL   Basophils Relative 0  0 - 1 %   Basophils Absolute 0.0  0.0 - 0.1 K/uL  CBC     Status: None   Collection Time    04/04/13  6:04 AM      Result Value Range   WBC 9.2  4.0 - 10.5 K/uL   RBC 4.50  3.87 - 5.11 MIL/uL   Hemoglobin 12.5  12.0 - 15.0 g/dL   HCT 16.1  09.6 - 04.5 %   MCV 86.7  78.0 - 100.0 fL   MCH 27.8  26.0 - 34.0 pg   MCHC 32.1  30.0 - 36.0 g/dL   RDW 40.9  81.1 - 91.4 %   Platelets 270  150 - 400 K/uL  COMPREHENSIVE METABOLIC PANEL     Status: Abnormal   Collection Time    04/04/13  6:04 AM      Result Value Range   Sodium 141  135 - 145 mEq/L   Potassium 3.2 (*) 3.5 - 5.1 mEq/L   Chloride 104  96 - 112 mEq/L   CO2 25  19 - 32 mEq/L   Glucose, Bld 104 (*) 70 - 99 mg/dL   BUN 13  6 - 23 mg/dL   Creatinine, Ser 7.82  0.50 - 1.10 mg/dL   Calcium 95.6  8.4 - 21.3 mg/dL   Total Protein 6.7  6.0 - 8.3 g/dL   Albumin 3.4 (*) 3.5 - 5.2 g/dL   AST 13  0 - 37 U/L   ALT 9  0 - 35 U/L   Alkaline Phosphatase 92  39 - 117 U/L   Total Bilirubin 0.2 (*) 0.3 - 1.2 mg/dL   GFR calc non Af Amer >90  >90 mL/min   GFR  calc Af Amer >90  >90 mL/min   Comment: (NOTE)     The eGFR has been calculated using the CKD EPI equation.     This calculation has not been validated in all clinical situations.     eGFR's persistently <90 mL/min signify possible Chronic Kidney     Disease.    Ct Head Wo Contrast  04/03/2013   CLINICAL DATA:  Postpartum with right leg weakness. 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 Postpartum depression.  EXAM: CT HEAD WITHOUT CONTRAST  TECHNIQUE: Contiguous axial images were obtained from the base of the skull through the vertex without intravenous contrast.  COMPARISON:  None.  FINDINGS: Low-density regions (vasogenic edema) predominantly subcortical white matter involving portions of the frontal lobes (greater on the left) and the left posterior temporal lobe/posterior left operculum/ periatrial region. Etiology indeterminate. This may be related to infection, inflammatory process or demyelinating process. Result of posterior reversible encephalopathy syndrome/ eclampsia not excluded although patient's delivery date was 01/27/2013. Multi focal infarction or tumor felt unlikely although not excluded. Result of venous infarct felt to be less likely consideration.  No intracranial hemorrhage.  No hydrocephalus.  IMPRESSION: Bilateral regions of vasogenic edema of indeterminate etiology. MR of the brain with contrast recommended. Please see above discussion.  These  results were called by telephone at the time of interpretation on 04/03/2013 at 2:00 PM to [facility] GRIFFIN physician's assistant, who verbally acknowledged these results. Patient is not acting infected per phone conversation. Patient is being scheduled for contrast enhanced MR of the brain.   Electronically Signed   By: Bridgett Larsson M.D.   On: 04/03/2013 14:14   Mr Laqueta Jean ZO Contrast  04/03/2013   CLINICAL DATA:  Two months postpartum presenting with right foot drop. Abnormal CT. History of HELLP syndrome during  pregnancy.  EXAM: MRI HEAD WITHOUT AND WITH CONTRAST  TECHNIQUE: Multiplanar, multiecho pulse sequences of the brain and surrounding structures were obtained without and with intravenous contrast.  CONTRAST:  12mL MULTIHANCE GADOBENATE DIMEGLUMINE 529 MG/ML IV SOLN  COMPARISON:  04/03/2013 head CT.  No comparison brain MR.  FINDINGS: Numerous intracranial lesions many which demonstrate enhancement involving supratentorial and infratentorial region. Index enhancing lesion within the posterior medial left frontal lobe measures 2 x 1.2 x 1.1 cm with marked surrounding vasogenic edema. White matter lesions extend into the corpus callosum. Findings in a patient of this age and gender are highly suspicious for demyelinating process and in particular multiple sclerosis. Acute disseminated encephalomyelitis (ADEM) cannot be excluded although a secondary consideration given the fact that some lesions enhance and others do not enhance.  Infectious process could not be excluded in the proper clinical setting. On the diffusion-weighted sequence, majority of the lesions do not demonstrate restricted motion in a distribution suspicious or abscesses.  Metastatic disease could not be excluded in the proper clinical setting.  Result of inflammatory process/vasculitis although not entirely excluded are felt to be less likely considerations.  Appearance is not typical for PRES syndrome or venous infarction.  No acute thrombotic infarct.  No abnormality noted involving the orbits or optic nerve.  Pituitary gland without evidence of hemorrhage or significant enlargement.  Prominent adenoidal tissue may be an incidental finding.  Decreased signal intensity of bone marrow may reflect result of anemia.  No hydrocephalus.  No intracranial hemorrhage.  Major intracranial vascular structures are patent.  On the series 9 image 16 the cervical cord appears slightly thickened at the C4 level which may reflect involvement by demyelinating process  but is not adequately imaged on the present exam.  IMPRESSION: Markedly abnormal examination as detailed above suspicious for multiple sclerosis with other considerations as detailed above. This will require clinical and laboratory correlation to confirm. Other potential causes as noted above will need to be considered and excluded.  Several of the white matter lesions has surrounding vasogenic edema including medial aspect of the posterior left frontal lobe which may contribute to patient's foot drop. Other areas of vasogenic edema include right frontal lobe, left periatrial region and posterior left operculum region as well temporal lobe bilaterally. Several of these areas enhance suggesting active demyelination. Involvement is more notable in supratentorial region although there is posterior fossa involvement.  Involvement of the cervical cord C4 level may be present but incompletely assessed.  These results were called by telephone at the time of interpretation on 04/03/2013 at 5:38 PM to Dr. Juleen China, who verbally acknowledged these results.   Electronically Signed   By: Bridgett Larsson M.D.   On: 04/03/2013 17:52        Jamy Cleckler A. Gerilyn Pilgrim, M.D.  Diplomate, Biomedical engineer of Psychiatry and Neurology ( Neurology). 04/04/2013, 8:20 AM

## 2013-04-04 NOTE — Progress Notes (Signed)
Utilization Review Complete  

## 2013-04-04 NOTE — Progress Notes (Signed)
Patient ID: Haley [name], female   DOB: Sep 05, 1990, 22 y.o.   MRN: [id number] [facility] [place] A. Gerilyn Pilgrim, MD     [email or web address]        I did review the patient's spinal fluid analysis. The WBC is mildly elevated at 13. She does not have a white shift. Fact, she has mostly lymphocytes. Protein and glucose are normal. I think that we can go ahead and start the patient on high-dose steroids. She was started on solumedrol 1 g daily for at least 3 days.

## 2013-04-04 NOTE — Lactation Note (Addendum)
This note was copied from the chart of Haley Delvina Mizzell. Lactation Consultation Note    Follow up consult note on this mom of a NICU baby, now 2 months old. [name] was admitted to [facility] yesterday, and according to Lulu Riding, social worker, is being worked up for possible multiple sclerosis. [name], according to [place], was refusing to take Lexapro, which was recently prescribed   for her for post partum depression, because she is afraid with providing EBM , it will hurt her baby. I spoke to mom yesterday, prior to her MRI and admission,  and after checking Lexapro in the [employer] book of medication and mother's milk, that Lexapro was safe to take with breast feeding, and I recommended that she take it. I spoke to her nurse today at [facility], and informed her that lexapro was safe to take with breast feeding, and that would she please tell mom I suggested she begin taking it. Mom is pumping at the hospital, and sending the milk home with family members.       [name] is on [address], and the desk # is [phone] [phone] and [name]'s room # is [phone]. I called Lulu Riding, and she is aware of my conversation with Heid's nurse.   Patient Name: Haley [name] [id number] Date: 04/04/2013 Reason for consult: Follow-up assessment;NICU baby   Maternal Data    Feeding Feeding Type: Breast Milk Length of feed: 45 min  LATCH Score/Interventions                      Lactation Tools Discussed/Used     Consult Status Consult Status: PRN Follow-up type:  (in NICU)    Alfred Levins 04/04/2013, 5:30 PM

## 2013-04-05 LAB — CRYPTOCOCCAL ANTIGEN, CSF: Crypto Ag: NEGATIVE

## 2013-04-05 LAB — HIV ANTIBODY (ROUTINE TESTING W REFLEX): HIV: NONREACTIVE

## 2013-04-05 LAB — RPR: RPR Ser Ql: NONREACTIVE

## 2013-04-05 MED ORDER — ALPRAZOLAM 0.5 MG PO TABS
0.5000 mg | ORAL_TABLET | Freq: Once | ORAL | Status: AC | PRN
  Administered 2013-04-05: 0.5 mg via ORAL
  Filled 2013-04-05: qty 1

## 2013-04-05 MED ORDER — OXYCODONE HCL 5 MG PO TABS
5.0000 mg | ORAL_TABLET | Freq: Four times a day (QID) | ORAL | Status: DC | PRN
  Administered 2013-04-05: 5 mg via ORAL
  Administered 2013-04-05 – 2013-04-07 (×4): 10 mg via ORAL
  Filled 2013-04-05 (×4): qty 2
  Filled 2013-04-05 (×2): qty 1

## 2013-04-05 MED ORDER — POTASSIUM CHLORIDE CRYS ER 20 MEQ PO TBCR
40.0000 meq | EXTENDED_RELEASE_TABLET | Freq: Once | ORAL | Status: AC
  Administered 2013-04-05: 40 meq via ORAL
  Filled 2013-04-05: qty 2

## 2013-04-05 NOTE — Consult Note (Signed)
NEURO HOSPITALIST CONSULT NOTE    Reason for Consult: right foot droop, MRI consistent with demyelinating disease  HPI:                                                                                                                                          Haley [name] is an 22 y.o. female with a past medical history significant for HSV-2 infection, postpartum depression, s/p C-section for severe preeclampsia on 01/27/2013, HELLP syndrome, and recent onset right foot droop, transferred to [facility] for further management of probable MS. Patient initial evaluation at [facility] but patient requested to be transfer to [facility] to be closer to her son that is still in the PICU. She indicated that for the past 2 weeks she has been having weakness of the right foot that caused her to drag her foot while walking as well as trouble putting her shoe on. Denies weakness of the right leg, upper extremities, or left LE. In addition, denies vertigo, double vision, difficulty swallowing, unsteadiness, slurred speech, painful visual loss, language impairment, bladder or bowel impairment. No recent fever or infection, vaccinations, or foreign travel. MRI brain with an without contrast performed 04/03/13 numerous intracranial lesions many which demonstrate enhancement involving supratentorial and infratentorial region. Index enhancing lesion within the posterior medial left frontal lobe measures 2 x 1.2 x 1.1 cm with marked surrounding vasogenic edema. White matter lesions extend into the corpus callosum. Had spinal tap with mildly elevated WBC at 13, mostly lymphocytes. Protein and glucose are normal. Oligoclonal bands and IGG index pending.Gram stain shows no organisms and protein/glucose are normal. CSF for cryptococcal antigen also found to be negative. HIV antibody is nonreactive, ESR was 12, homocysteine, B12 and RPR were unremarkable. Received 1 dose IV solumedrol and said that it did help significantly and  now she is noticing better control of the right foot.  Past Medical History  Diagnosis Date  . Heart murmur   . Hx of chlamydia infection   . Abnormal Pap smear   . HSV-2 (herpes simplex virus 2) infection   . Pregnant   . Mental disorder     depression,postpartum  . Postpartum depression 04/03/2013  . Right foot drop 04/03/2013    Has - babinski, some muscle weakness  Noted when trying to curl toes    Past Surgical History  Procedure Laterality Date  . Wisdom tooth extraction  2012  . Cesarean section N/A 01/27/2013    Procedure: CESAREAN SECTION;  Surgeon: Catalina Antigua, MD;  Location: [facility];  Service: Obstetrics;  Laterality: N/A;    Family History  Problem Relation Age of Onset  . Cancer Paternal Grandmother     breast  . Anxiety disorder Mother   . Heart disease Mother   . Stroke Paternal  Aunt     Social History:  reports that she quit smoking about 2 years ago. Her smoking use included Cigarettes. She smoked 0.00 packs per day. She has never used smokeless tobacco. She reports that she does not drink alcohol or use illicit drugs.  Allergies  Allergen Reactions  . Penicillins Other (See Comments)    Unknown childhood reaction    MEDICATIONS:                                                                                                                     I have reviewed the patient's current medications.   ROS:                                                                                                                                       History obtained from the patient and chart review.  General ROS: negative for - chills, fatigue, fever, night sweats, weight gain or weight loss Psychological ROS: negative for - behavioral disorder, hallucinations, memory difficulties, mood swings or suicidal ideation Ophthalmic ROS: negative for - blurry vision, double vision, eye pain or loss of vision ENT ROS: negative for - epistaxis, nasal discharge, oral lesions,  sore throat, tinnitus or vertigo Allergy and Immunology ROS: negative for - hives or itchy/watery eyes Hematological and Lymphatic ROS: negative for - bleeding problems, bruising or swollen lymph nodes Endocrine ROS: negative for - galactorrhea, hair pattern changes, polydipsia/polyuria or temperature intolerance Respiratory ROS: negative for - cough, hemoptysis, shortness of breath or wheezing Cardiovascular ROS: negative for - chest pain, dyspnea on exertion, edema or irregular heartbeat Gastrointestinal ROS: negative for - abdominal pain, diarrhea, hematemesis, nausea/vomiting or stool incontinence Genito-Urinary ROS: negative for - dysuria, hematuria, incontinence or urinary frequency/urgency Musculoskeletal ROS: negative for - joint swelling or muscular weakness Neurological ROS: as noted in HPI Dermatological ROS: negative for rash and skin lesion changes  Physical exam: pleasant female in no apparent distress. Blood pressure 112/64, pulse 81, temperature 98.4 F (36.9 C), temperature source Oral, resp. rate 18, height 5\' 1"  (1.549 m), weight 59.7 kg (131 lb 9.8 oz), last menstrual period 04/03/2013, SpO2 99.00%, currently breastfeeding. Head: normocephalic. Neck: supple, no bruits, no JVD. Cardiac: no murmurs. Lungs: clear. Abdomen: soft, no tender, no mass. Extremities: no edema.  Neurologic Examination:  Mental Status: Alert, oriented, thought content appropriate.  Speech fluent without evidence of aphasia.  Able to follow 3 step commands without difficulty. Cranial Nerves: II: Discs flat bilaterally; Visual fields grossly normal, pupils equal, round, reactive to light and accommodation III,IV, VI: ptosis not present, extra-ocular motions intact bilaterally V,VII: smile symmetric, facial light touch sensation normal bilaterally VIII: hearing normal bilaterally IX,X: gag reflex  present XI: bilateral shoulder shrug XII: midline tongue extension without atrophy or fasciculations  Motor: Significant for 3+/5 weakness right dorsiflexors and right hamstring, knee extensor.  Tone and bulk:normal tone throughout; no atrophy noted Sensory: Pinprick and light touch intact throughout, bilaterally Deep Tendon Reflexes:  Hyperreflexia left biceps and right knee jerk greater than left, with right plantar clonus.  Plantars: Right: downgoing   Left: downgoing Cerebellar: normal finger-to-nose,  normal heel-to-shin test Gait: No tested. CV: pulses palpable throughout    No results found for this basename: cbc, bmp, coags, chol, tri, ldl, hga1c    Results for orders placed during the hospital encounter of 04/03/13 (from the past 48 hour(s))  BASIC METABOLIC PANEL     Status: Abnormal   Collection Time    04/03/13  8:56 PM      Result Value Range   Sodium 142  135 - 145 mEq/L   Potassium 3.2 (*) 3.5 - 5.1 mEq/L   Chloride 106  96 - 112 mEq/L   CO2 27  19 - 32 mEq/L   Glucose, Bld 101 (*) 70 - 99 mg/dL   BUN 13  6 - 23 mg/dL   Creatinine, Ser 0.98  0.50 - 1.10 mg/dL   Calcium 9.1  8.4 - 11.9 mg/dL   GFR calc non Af Amer >90  >90 mL/min   GFR calc Af Amer >90  >90 mL/min   Comment: (NOTE)     The eGFR has been calculated using the CKD EPI equation.     This calculation has not been validated in all clinical situations.     eGFR's persistently <90 mL/min signify possible Chronic Kidney     Disease.  CBC WITH DIFFERENTIAL     Status: Abnormal   Collection Time    04/03/13  8:56 PM      Result Value Range   WBC 10.8 (*) 4.0 - 10.5 K/uL   RBC 4.35  3.87 - 5.11 MIL/uL   Hemoglobin 12.2  12.0 - 15.0 g/dL   HCT 14.7  82.9 - 56.2 %   MCV 85.3  78.0 - 100.0 fL   MCH 28.0  26.0 - 34.0 pg   MCHC 32.9  30.0 - 36.0 g/dL   RDW 13.0  86.5 - 78.4 %   Platelets 278  150 - 400 K/uL   Neutrophils Relative % 58  43 - 77 %   Neutro Abs 6.2  1.7 - 7.7 K/uL   Lymphocytes  Relative 32  12 - 46 %   Lymphs Abs 3.4  0.7 - 4.0 K/uL   Monocytes Relative 8  3 - 12 %   Monocytes Absolute 0.9  0.1 - 1.0 K/uL   Eosinophils Relative 2  0 - 5 %   Eosinophils Absolute 0.2  0.0 - 0.7 K/uL   Basophils Relative 0  0 - 1 %   Basophils Absolute 0.0  0.0 - 0.1 K/uL  CBC     Status: None   Collection Time    04/04/13  6:04 AM      Result Value Range   WBC 9.2  4.0 - 10.5 K/uL   RBC 4.50  3.87 - 5.11 MIL/uL   Hemoglobin 12.5  12.0 - 15.0 g/dL   HCT 16.1  09.6 - 04.5 %   MCV 86.7  78.0 - 100.0 fL   MCH 27.8  26.0 - 34.0 pg   MCHC 32.1  30.0 - 36.0 g/dL   RDW 40.9  81.1 - 91.4 %   Platelets 270  150 - 400 K/uL  COMPREHENSIVE METABOLIC PANEL     Status: Abnormal   Collection Time    04/04/13  6:04 AM      Result Value Range   Sodium 141  135 - 145 mEq/L   Potassium 3.2 (*) 3.5 - 5.1 mEq/L   Chloride 104  96 - 112 mEq/L   CO2 25  19 - 32 mEq/L   Glucose, Bld 104 (*) 70 - 99 mg/dL   BUN 13  6 - 23 mg/dL   Creatinine, Ser 7.82  0.50 - 1.10 mg/dL   Calcium 95.6  8.4 - 21.3 mg/dL   Total Protein 6.7  6.0 - 8.3 g/dL   Albumin 3.4 (*) 3.5 - 5.2 g/dL   AST 13  0 - 37 U/L   ALT 9  0 - 35 U/L   Alkaline Phosphatase 92  39 - 117 U/L   Total Bilirubin 0.2 (*) 0.3 - 1.2 mg/dL   GFR calc non Af Amer >90  >90 mL/min   GFR calc Af Amer >90  >90 mL/min   Comment: (NOTE)     The eGFR has been calculated using the CKD EPI equation.     This calculation has not been validated in all clinical situations.     eGFR's persistently <90 mL/min signify possible Chronic Kidney     Disease.  CRYPTOCOCCAL ANTIGEN, CSF     Status: None   Collection Time    04/04/13 11:21 AM      Result Value Range   Crypto Ag NEGATIVE  NEGATIVE   Cryptococcal Ag Titer NOT INDICATED  NOT INDICATED   Comment: Performed at [facility]  PROTEIN AND GLUCOSE, CSF     Status: None   Collection Time    04/04/13 11:21 AM      Result Value Range   Glucose, CSF 58  43 - 76 mg/dL   Total  Protein, CSF  35  15 - 45 mg/dL  CSF CELL COUNT WITH DIFFERENTIAL     Status: Abnormal   Collection Time    04/04/13 11:21 AM      Result Value Range   Tube # 3     Color, CSF COLORLESS  COLORLESS   Comment: CORRECTED ON 11/21 AT 1342: PREVIOUSLY REPORTED AS YELLOW   Appearance, CSF CLEAR  CLEAR   Supernatant NOT INDICATED     RBC Count, CSF 3 (*) 0 /cu mm   WBC, CSF 13 (*) 0 - 5 /cu mm   Comment: CRITICAL RESULT CALLED TO, READ BACK BY AND VERIFIED WITH:     A. [name] AT 1405 ON 04/04/13 BY S. VANHOORNE   Segmented Neutrophils-CSF 0  0 - 6 %   Lymphs, CSF 78  40 - 80 %   Monocyte-Macrophage-Spinal Fluid 22  15 - 45 %   Eosinophils, CSF 0  0 - 1 %   Other Cells, CSF PENDING PATHOLOGIST REVIEW    CSF CULTURE     Status: None   Collection Time    04/04/13 11:21 AM  Result Value Range   Specimen Description CSF     Special Requests NONE     Gram Stain       Value: RARE WBC PRESENT,BOTH PMN AND MONONUCLEAR     NO ORGANISMS SEEN     Performed at [facility]   Culture       Value: NO GROWTH 1 DAY     Performed at [facility]   Report Status PENDING    RPR     Status: None   Collection Time    04/04/13 12:36 PM      Result Value Range   RPR NON REACTIVE  NON REACTIVE   Comment: Performed at [facility]  VITAMIN B12     Status: None   Collection Time    04/04/13 12:36 PM      Result Value Range   Vitamin B-12 603  211 - 911 pg/mL   Comment: Performed at [facility]  HOMOCYSTEINE     Status: None   Collection Time    04/04/13 12:36 PM      Result Value Range   Homocysteine 6.5  4.0 - 15.4 umol/L   Comment: Performed at [facility]  SEDIMENTATION RATE     Status: None   Collection Time    04/04/13 12:36 PM      Result Value Range   Sed Rate 12  0 - 22 mm/hr  HIV ANTIBODY (ROUTINE TESTING)     Status: None   Collection Time    04/04/13 12:36 PM      Result Value Range   HIV NON REACTIVE  NON REACTIVE   Comment: Performed at [facility]    Dg Fluoro Guide Lumbar Puncture  04/04/2013   CLINICAL DATA:  Abnormal MRI brain question multiple sclerosis  EXAM: DIAGNOSTIC LUMBAR PUNCTURE UNDER FLUOROSCOPIC GUIDANCE  FLUOROSCOPY TIME:  0 min 6 seconds  PROCEDURE: Informed consent was obtained from the patient prior to the procedure, including potential complications of headache, allergy, and pain. L4-L5 disc space was localized under fluoroscopy. With the patient prone, the lower back was prepped with Betadine. 1% Lidocaine was used for local anesthesia, 2.0 mL total. Lumbar puncture was performed at the L4-L5 level using a 22 gauge needle with return of clear colorless CSF with an opening pressure of 9 cm water (measured prone). 12 ml of CSF were obtained for laboratory studies. Patient tolerated the procedure well without immediate complication.  IMPRESSION: Fluoroscopic guided lumbar puncture as above.   Electronically Signed   By: Ulyses Southward M.D.   On: 04/04/2013 15:42   Assessment/Plan: 22 y/o with recent onset right foot droop and MRI brain with/without contrast revealing numerous intracranial lesions many which demonstrate enhancement involving supratentorial and infratentorial region, some of them with surrounding vasogenic edema. Broad differential but MRI findings highly suggestive of a demyelinating disorder. Recommend: 1) continue IV solumedrol  1 gram x 2 more days. 2) MRI cervical and thoracic spine with and without contrast. 3) PT. Will follow up  Wyatt Portela, MD 04/05/2013, 5:57 PM

## 2013-04-05 NOTE — Progress Notes (Signed)
[facility] PROGRESS NOTE  Haley [name]:[id number] DOB: 1990-08-01 DOA: 04/03/2013 PCP: Provider Not In System  Assessment/Plan: 1. Right foot drop with abnormal MRI brain.  Differential remains broad at this time.  Lumbar puncture was done yesterday with preliminary results showing 13 WBC in CSF, predominantly lymphocytes. Gram stain shows no organisms and protein/glucose are normal. Csf for cryptococcal antigen also found to be negative. HIV antibody is nonreactive, ESR was 12, homocysteine, B12 and RPR were unremarkable.  Patient was started on steroids per neurology yesterday and has received one dose.  Patient and family are requesting to [facility], so that they may be closer to family and patient's baby. They would also like a second opinion from neurology.  I have discussed the case with Dr. Isidoro Donning with [facility] who has accepted the patient in transfer.  I have also discussed the case with Dr.  Cyril Mourning with the [facility] and alerted him regarding need for consultation on patient's arrival.  2. Postpartum depression.  Patient does not wish to take lexapro since she is breast feeding.  Denies any suicidal/homicidal ideations at this time. She does not appear depressed. 3. Headache.  Patient has been having headache since lumbar puncture.  No significant improvement with tylenol. Will try oxycodone.  If she does not improve, then may need to consider blood patch.   Code Status: full code Family Communication: discussed with patient and family members at the bedside Disposition Plan: discharge home once improved   Consultants:  Neurology  Procedures:  Lumbar puncture by radiology on 11/21  Antibiotics:  none  HPI/Subjective: Complains of headache.  Feels right foot may be feeling a little better  Objective: Filed Vitals:   04/05/13 0700  BP: 112/57  Pulse: 67  Temp: 97.7 F (36.5 C)  Resp: 20    Intake/Output Summary (Last 24 hours)  at 04/05/13 1111 Last data filed at 04/05/13 0800  Gross per 24 hour  Intake    480 ml  Output      1 ml  Net    479 ml   Filed Weights   04/03/13 1421 04/03/13 2200  Weight: 58.968 kg (130 lb) 59.7 kg (131 lb 9.8 oz)    Exam:   General:  NAD  Cardiovascular: s1, s2, rrr  Respiratory: cta b  Abdomen: soft, nt, nd, bs+  Musculoskeletal: no edema b/l  Neurologic: dorsiflexion at right ankle is 3-4/5.    Data Reviewed: Basic Metabolic Panel:  Recent Labs Lab 04/03/13 1145 04/03/13 2056 04/04/13 0604  NA 142 142 141  K 4.1 3.2* 3.2*  CL 104 106 104  CO2 25 27 25   GLUCOSE 87 101* 104*  BUN 11 13 13   CREATININE 0.62 0.72 0.59  CALCIUM 9.6 9.1 10.1   Liver Function Tests:  Recent Labs Lab 04/03/13 1145 04/04/13 0604  AST 14 13  ALT 10 9  ALKPHOS 93 92  BILITOT 0.4 0.2*  PROT 6.5 6.7  ALBUMIN 4.2 3.4*   No results found for this basename: LIPASE, AMYLASE,  in the last 168 hours No results found for this basename: AMMONIA,  in the last 168 hours CBC:  Recent Labs Lab 04/03/13 1145 04/03/13 2056 04/04/13 0604  WBC 9.5 10.8* 9.2  NEUTROABS  --  6.2  --   HGB 13.1 12.2 12.5  HCT 39.1 37.1 39.0  MCV 81.5 85.3 86.7  PLT 332 278 270   Cardiac Enzymes: No results found for this basename: CKTOTAL, CKMB, CKMBINDEX, TROPONINI,  in  the last 168 hours BNP (last 3 results) No results found for this basename: PROBNP,  in the last 8760 hours CBG: No results found for this basename: GLUCAP,  in the last 168 hours  Recent Results (from the past 240 hour(s))  CSF CULTURE     Status: None   Collection Time    04/04/13 11:21 AM      Result Value Range Status   Specimen Description CSF   Final   Special Requests NONE   Final   Gram Stain     Final   Value: RARE WBC PRESENT,BOTH PMN AND MONONUCLEAR     NO ORGANISMS SEEN     Performed at [facility]   Culture PENDING   Incomplete   Report Status PENDING   Incomplete     Studies: Ct Head Wo  Contrast  04/03/2013   CLINICAL DATA:  Postpartum with right leg weakness. 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 Postpartum depression.  EXAM: CT HEAD WITHOUT CONTRAST  TECHNIQUE: Contiguous axial images were obtained from the base of the skull through the vertex without intravenous contrast.  COMPARISON:  None.  FINDINGS: Low-density regions (vasogenic edema) predominantly subcortical white matter involving portions of the frontal lobes (greater on the left) and the left posterior temporal lobe/posterior left operculum/ periatrial region. Etiology indeterminate. This may be related to infection, inflammatory process or demyelinating process. Result of posterior reversible encephalopathy syndrome/ eclampsia not excluded although patient's delivery date was 01/27/2013. Multi focal infarction or tumor felt unlikely although not excluded. Result of venous infarct felt to be less likely consideration.  No intracranial hemorrhage.  No hydrocephalus.  IMPRESSION: Bilateral regions of vasogenic edema of indeterminate etiology. MR of the brain with contrast recommended. Please see above discussion.  These results were called by telephone at the time of interpretation on 04/03/2013 at 2:00 PM to [facility] GRIFFIN physician's assistant, who verbally acknowledged these results. Patient is not acting infected per phone conversation. Patient is being scheduled for contrast enhanced MR of the brain.   Electronically Signed   By: Bridgett Larsson M.D.   On: 04/03/2013 14:14   Mr Laqueta Jean WU Contrast  04/03/2013   CLINICAL DATA:  Two months postpartum presenting with right foot drop. Abnormal CT. History of HELLP syndrome during pregnancy.  EXAM: MRI HEAD WITHOUT AND WITH CONTRAST  TECHNIQUE: Multiplanar, multiecho pulse sequences of the brain and surrounding structures were obtained without and with intravenous contrast.  CONTRAST:  12mL MULTIHANCE GADOBENATE DIMEGLUMINE 529 MG/ML IV SOLN  COMPARISON:   04/03/2013 head CT.  No comparison brain MR.  FINDINGS: Numerous intracranial lesions many which demonstrate enhancement involving supratentorial and infratentorial region. Index enhancing lesion within the posterior medial left frontal lobe measures 2 x 1.2 x 1.1 cm with marked surrounding vasogenic edema. White matter lesions extend into the corpus callosum. Findings in a patient of this age and gender are highly suspicious for demyelinating process and in particular multiple sclerosis. Acute disseminated encephalomyelitis (ADEM) cannot be excluded although a secondary consideration given the fact that some lesions enhance and others do not enhance.  Infectious process could not be excluded in the proper clinical setting. On the diffusion-weighted sequence, majority of the lesions do not demonstrate restricted motion in a distribution suspicious or abscesses.  Metastatic disease could not be excluded in the proper clinical setting.  Result of inflammatory process/vasculitis although not entirely excluded are felt to be less likely considerations.  Appearance is not typical for PRES syndrome  or venous infarction.  No acute thrombotic infarct.  No abnormality noted involving the orbits or optic nerve.  Pituitary gland without evidence of hemorrhage or significant enlargement.  Prominent adenoidal tissue may be an incidental finding.  Decreased signal intensity of bone marrow may reflect result of anemia.  No hydrocephalus.  No intracranial hemorrhage.  Major intracranial vascular structures are patent.  On the series 9 image 16 the cervical cord appears slightly thickened at the C4 level which may reflect involvement by demyelinating process but is not adequately imaged on the present exam.  IMPRESSION: Markedly abnormal examination as detailed above suspicious for multiple sclerosis with other considerations as detailed above. This will require clinical and laboratory correlation to confirm. Other potential causes  as noted above will need to be considered and excluded.  Several of the white matter lesions has surrounding vasogenic edema including medial aspect of the posterior left frontal lobe which may contribute to patient's foot drop. Other areas of vasogenic edema include right frontal lobe, left periatrial region and posterior left operculum region as well temporal lobe bilaterally. Several of these areas enhance suggesting active demyelination. Involvement is more notable in supratentorial region although there is posterior fossa involvement.  Involvement of the cervical cord C4 level may be present but incompletely assessed.  These results were called by telephone at the time of interpretation on 04/03/2013 at 5:38 PM to Dr. Juleen China, who verbally acknowledged these results.   Electronically Signed   By: Bridgett Larsson M.D.   On: 04/03/2013 17:52   Dg Fluoro Guide Lumbar Puncture  04/04/2013   CLINICAL DATA:  Abnormal MRI brain question multiple sclerosis  EXAM: DIAGNOSTIC LUMBAR PUNCTURE UNDER FLUOROSCOPIC GUIDANCE  FLUOROSCOPY TIME:  0 min 6 seconds  PROCEDURE: Informed consent was obtained from the patient prior to the procedure, including potential complications of headache, allergy, and pain. L4-L5 disc space was localized under fluoroscopy. With the patient prone, the lower back was prepped with Betadine. 1% Lidocaine was used for local anesthesia, 2.0 mL total. Lumbar puncture was performed at the L4-L5 level using a 22 gauge needle with return of clear colorless CSF with an opening pressure of 9 cm water (measured prone). 12 ml of CSF were obtained for laboratory studies. Patient tolerated the procedure well without immediate complication.  IMPRESSION: Fluoroscopic guided lumbar puncture as above.   Electronically Signed   By: Ulyses Southward M.D.   On: 04/04/2013 15:42    Scheduled Meds: . methylPREDNISolone (SOLU-MEDROL) injection  1,000 mg Intravenous Q24H  . sodium chloride  3 mL Intravenous Q12H    Continuous Infusions:   Principal Problem:   Right foot drop Active Problems:   2014 Pregnancy w/ severe pre-e/HELLP, c/s @ 27wks   Postpartum depression Abnormal MRI brain   Time spent:    [facility]  [facility] Pager [phone]. If 7PM-7AM, please contact night-coverage at [email or web address], password [facility] 04/05/2013, 11:11 AM  LOS: 2 days
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
## 2013-04-05 NOTE — Progress Notes (Signed)
Family and patient requested that they be moved to [facility] for treatment so that she could be closer to her baby that was in the NICU and also because they would like to see a different Neurologist.  I verbalized understanding and stated that I would voiced the concerns to Dr. Kerry Hough.

## 2013-04-05 NOTE — Progress Notes (Signed)
Pt brought in through [facility] from [place]. Pt is alert and orient, with some weakness on right foot. Walked to the bed on supervision with no assistance. Vitals signs WNL. Educate about hospital policies. Neurologist in room assessing the pt. Danne [place]

## 2013-04-06 ENCOUNTER — Inpatient Hospital Stay (HOSPITAL_COMMUNITY): Payer: Medicaid Other

## 2013-04-06 DIAGNOSIS — R51 Headache: Secondary | ICD-10-CM

## 2013-04-06 MED ORDER — ALPRAZOLAM 0.5 MG PO TABS
0.5000 mg | ORAL_TABLET | Freq: Once | ORAL | Status: AC
  Administered 2013-04-06: 0.5 mg via ORAL

## 2013-04-06 MED ORDER — GADOBENATE DIMEGLUMINE 529 MG/ML IV SOLN
12.0000 mL | Freq: Once | INTRAVENOUS | Status: AC | PRN
  Administered 2013-04-06: 12 mL via INTRAVENOUS

## 2013-04-06 NOTE — Progress Notes (Signed)
[facility]                                                                                Patient Demographics  Haley [name], is a 22 y.o. female, DOB - [birth year], ZOX:[id number]  Admit date - 04/03/2013   Admitting Physician Ripudeep Jenna Luo, MD  Outpatient Primary MD for the patient is Provider Not In System  LOS - 3   Chief Complaint  Patient presents with  . right foot drop, sent from ct, ab. scan.         Assessment & Plan   Principal Problem:   Right foot drop Active Problems:   2014 Pregnancy w/ severe pre-e/HELLP, c/s @ 27wks   Postpartum depression  Right footdrop with abnormal MRI -MRI of the brain with and without contrast reveals numerous intracranial lesions which may demonstrate enhancement involving supratentorial and infratentorial region, some of them with surrounding vasogenic edema, with suggestion of a demyelinating disorder. -MRI of spine has been ordered. -Neurology consulted and following. -Physical therapy also consulted. -Will continue IV Solu-Medrol for an additional one day, patient receive her second dose today  Postpartum depression -Patient does not wish to take any medication due to breast feeding. -She denies any suicidal or homicidal ideations at this time.  Headache -Likely secondary to lumbar puncture -Has resolved.  Code Status: Full code  Family Communication: Fianc at bedside  Disposition Plan: Admitted we'll discharge home once improved   Procedures  Lumbar puncture by radiology on 04/04/2013  Consults   Neurology  DVT Prophylaxis  SCDs   Lab Results  Component Value Date   PLT 270 04/04/2013    Medications  Scheduled Meds: . methylPREDNISolone (SOLU-MEDROL) injection  1,000 mg Intravenous Q24H  . sodium chloride  3 mL Intravenous Q12H   Continuous Infusions:  PRN Meds:.sodium chloride, acetaminophen, ALPRAZolam, oxyCODONE, sodium chloride  Antibiotics    Anti-infectives   None     Time Spent  in minutes   30 minutes  Edin Skarda D.O. on 04/06/2013 at 12:54 PM  Between 7am to 7pm - Pager - [phone]  After 7pm go to [email or web address] - password [email or web address]  And look for the night coverage person covering for me after hours  [facility] Office  [phone]    Subjective:   Haley [name] seen and examined today.  Patient states that she feels that her right lower extremity is moving better. She also states her headache has actually subsided. She would like to go see her baby at [facility]. Patient denies dizziness, chest pain, shortness of breath, abdominal pain, N/V/D/C, new weakness, numbess, tingling.    Objective:   Filed Vitals:   04/05/13 2215 04/06/13 0200 04/06/13 0600 04/06/13 1000  BP: 116/71 120/72 113/64 134/70  Pulse: 84 86 77 72  Temp: 98.3 F (36.8 C) 98.1 F (36.7 C) 97.7 F (36.5 C) 97.7 F (36.5 C)  TempSrc: Oral Oral Oral Oral  Resp: 18 18 18 18   Height:      Weight:      SpO2: 97% 97% 99% 99%    Wt Readings from Last 3 Encounters:  04/03/13 59.7 kg (131 lb 9.8 oz)  04/03/13 58.968 kg (130  lb)  03/18/13 59.875 kg (132 lb)    No intake or output data in the 24 hours ending 04/06/13 1254  Exam  General: Well developed, well nourished, NAD, appears stated age  HEENT: NCAT, PERRLA, EOMI, Anicteic Sclera, mucous membranes moist. No pharyngeal erythema or exudates  Neck: Supple, no JVD, no masses  Cardiovascular: S1 S2 auscultated, no rubs, murmurs or gallops. Regular rate and rhythm.  Respiratory: Clear to auscultation bilaterally with equal chest rise  Abdomen: Soft, nontender, nondistended, + bowel sounds  Extremities: warm dry without cyanosis clubbing or edema  Neuro: AAOx3, cranial nerves grossly intact. Strength 5/5 in patient's upper and lower extremities bilaterally  Skin: Without rashes exudates or nodules  Psych: Normal affect and demeanor with intact judgement and insight  Data Review   Micro Results Recent  Results (from the past 240 hour(s))  CSF CULTURE     Status: None   Collection Time    04/04/13 11:21 AM      Result Value Range Status   Specimen Description CSF   Final   Special Requests NONE   Final   Gram Stain     Final   Value: RARE WBC PRESENT,BOTH PMN AND MONONUCLEAR     NO ORGANISMS SEEN     Performed at [facility]   Culture     Final   Value: NO GROWTH 1 DAY     Performed at [facility]   Report Status PENDING   Incomplete    Radiology Reports Ct Head Wo Contrast  04/03/2013   CLINICAL DATA:  Postpartum with right leg weakness. 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 Postpartum depression.  EXAM: CT HEAD WITHOUT CONTRAST  TECHNIQUE: Contiguous axial images were obtained from the base of the skull through the vertex without intravenous contrast.  COMPARISON:  None.  FINDINGS: Low-density regions (vasogenic edema) predominantly subcortical white matter involving portions of the frontal lobes (greater on the left) and the left posterior temporal lobe/posterior left operculum/ periatrial region. Etiology indeterminate. This may be related to infection, inflammatory process or demyelinating process. Result of posterior reversible encephalopathy syndrome/ eclampsia not excluded although patient's delivery date was 01/27/2013. Multi focal infarction or tumor felt unlikely although not excluded. Result of venous infarct felt to be less likely consideration.  No intracranial hemorrhage.  No hydrocephalus.  IMPRESSION: Bilateral regions of vasogenic edema of indeterminate etiology. MR of the brain with contrast recommended. Please see above discussion.  These results were called by telephone at the time of interpretation on 04/03/2013 at 2:00 PM to [facility] GRIFFIN physician's assistant, who verbally acknowledged these results. Patient is not acting infected per phone conversation. Patient is being scheduled for contrast enhanced MR of the brain.    Electronically Signed   By: Bridgett Larsson M.D.   On: 04/03/2013 14:14   Mr Laqueta Jean ZO Contrast  04/03/2013   CLINICAL DATA:  Two months postpartum presenting with right foot drop. Abnormal CT. History of HELLP syndrome during pregnancy.  EXAM: MRI HEAD WITHOUT AND WITH CONTRAST  TECHNIQUE: Multiplanar, multiecho pulse sequences of the brain and surrounding structures were obtained without and with intravenous contrast.  CONTRAST:  12mL MULTIHANCE GADOBENATE DIMEGLUMINE 529 MG/ML IV SOLN  COMPARISON:  04/03/2013 head CT.  No comparison brain MR.  FINDINGS: Numerous intracranial lesions many which demonstrate enhancement involving supratentorial and infratentorial region. Index enhancing lesion within the posterior medial left frontal lobe measures 2 x 1.2 x 1.1 cm with marked surrounding vasogenic  edema. White matter lesions extend into the corpus callosum. Findings in a patient of this age and gender are highly suspicious for demyelinating process and in particular multiple sclerosis. Acute disseminated encephalomyelitis (ADEM) cannot be excluded although a secondary consideration given the fact that some lesions enhance and others do not enhance.  Infectious process could not be excluded in the proper clinical setting. On the diffusion-weighted sequence, majority of the lesions do not demonstrate restricted motion in a distribution suspicious or abscesses.  Metastatic disease could not be excluded in the proper clinical setting.  Result of inflammatory process/vasculitis although not entirely excluded are felt to be less likely considerations.  Appearance is not typical for PRES syndrome or venous infarction.  No acute thrombotic infarct.  No abnormality noted involving the orbits or optic nerve.  Pituitary gland without evidence of hemorrhage or significant enlargement.  Prominent adenoidal tissue may be an incidental finding.  Decreased signal intensity of bone marrow may reflect result of anemia.  No  hydrocephalus.  No intracranial hemorrhage.  Major intracranial vascular structures are patent.  On the series 9 image 16 the cervical cord appears slightly thickened at the C4 level which may reflect involvement by demyelinating process but is not adequately imaged on the present exam.  IMPRESSION: Markedly abnormal examination as detailed above suspicious for multiple sclerosis with other considerations as detailed above. This will require clinical and laboratory correlation to confirm. Other potential causes as noted above will need to be considered and excluded.  Several of the white matter lesions has surrounding vasogenic edema including medial aspect of the posterior left frontal lobe which may contribute to patient's foot drop. Other areas of vasogenic edema include right frontal lobe, left periatrial region and posterior left operculum region as well temporal lobe bilaterally. Several of these areas enhance suggesting active demyelination. Involvement is more notable in supratentorial region although there is posterior fossa involvement.  Involvement of the cervical cord C4 level may be present but incompletely assessed.  These results were called by telephone at the time of interpretation on 04/03/2013 at 5:38 PM to Dr. Juleen China, who verbally acknowledged these results.   Electronically Signed   By: Bridgett Larsson M.D.   On: 04/03/2013 17:52   Dg Fluoro Guide Lumbar Puncture  04/04/2013   CLINICAL DATA:  Abnormal MRI brain question multiple sclerosis  EXAM: DIAGNOSTIC LUMBAR PUNCTURE UNDER FLUOROSCOPIC GUIDANCE  FLUOROSCOPY TIME:  0 min 6 seconds  PROCEDURE: Informed consent was obtained from the patient prior to the procedure, including potential complications of headache, allergy, and pain. L4-L5 disc space was localized under fluoroscopy. With the patient prone, the lower back was prepped with Betadine. 1% Lidocaine was used for local anesthesia, 2.0 mL total. Lumbar puncture was performed at the L4-L5  level using a 22 gauge needle with return of clear colorless CSF with an opening pressure of 9 cm water (measured prone). 12 ml of CSF were obtained for laboratory studies. Patient tolerated the procedure well without immediate complication.  IMPRESSION: Fluoroscopic guided lumbar puncture as above.   Electronically Signed   By: Ulyses Southward M.D.   On: 04/04/2013 15:42    CBC  Recent Labs Lab 04/03/13 1145 04/03/13 2056 04/04/13 0604  WBC 9.5 10.8* 9.2  HGB 13.1 12.2 12.5  HCT 39.1 37.1 39.0  PLT 332 278 270  MCV 81.5 85.3 86.7  MCH 27.3 28.0 27.8  MCHC 33.5 32.9 32.1  RDW 14.2 13.5 13.5  LYMPHSABS  --  3.4  --  MONOABS  --  0.9  --   EOSABS  --  0.2  --   BASOSABS  --  0.0  --     Chemistries   Recent Labs Lab 04/03/13 1145 04/03/13 2056 04/04/13 0604 04/06/13 0915  NA 142 142 141  --   K 4.1 3.2* 3.2* 4.0  CL 104 106 104  --   CO2 25 27 25   --   GLUCOSE 87 101* 104*  --   BUN 11 13 13   --   CREATININE 0.62 0.72 0.59  --   CALCIUM 9.6 9.1 10.1  --   AST 14  --  13  --   ALT 10  --  9  --   ALKPHOS 93  --  92  --   BILITOT 0.4  --  0.2*  --    ------------------------------------------------------------------------------------------------------------------ estimated creatinine clearance is 90.8 ml/min (by C-G formula based on Cr of 0.59). ------------------------------------------------------------------------------------------------------------------ No results found for this basename: HGBA1C,  in the last 72 hours ------------------------------------------------------------------------------------------------------------------ No results found for this basename: CHOL, HDL, LDLCALC, TRIG, CHOLHDL, LDLDIRECT,  in the last 72 hours ------------------------------------------------------------------------------------------------------------------ No results found for this basename: TSH, T4TOTAL, FREET3, T3FREE, THYROIDAB,  in the last 72  hours ------------------------------------------------------------------------------------------------------------------  Recent Labs  04/04/13 1236  VITAMINB12 603    Coagulation profile No results found for this basename: INR, PROTIME,  in the last 168 hours  No results found for this basename: DDIMER,  in the last 72 hours  Cardiac Enzymes No results found for this basename: CK, CKMB, TROPONINI, MYOGLOBIN,  in the last 168 hours ------------------------------------------------------------------------------------------------------------------ No components found with this basename: POCBNP,

## 2013-04-06 NOTE — Evaluation (Signed)
Physical Therapy Evaluation Patient Details Name: Haley [name] MRN: [id number] DOB: 07-12-1990 Today's Date: 04/06/2013 Time: 4098-1191 PT Time Calculation (min): 15 min  PT Assessment / Plan / Recommendation History of Present Illness   22 yo admitted with right foot drop.  Abnormal MRI suggestive of demyelinating disorder, further work up and treatment ongoing  Clinical Impression  Pt presents with improved right dorsiflexion/plantarflexion, still diminished compared to left, but does not impact function currently.  Pt advised to discuss course of recovery following steroid treatment with MD and to monitor function for return of symptoms, seek PT if/when symptoms return.  Educated on relative risk of fatigue with activity and impact on function.  Pt with many questions regarding safety of medications in setting of breastfeeding micro-preemie (daughter born at 55 weeks on 01/27/13, remains at NICU at [facility]).  Advised to ask neurologist, obstetrician, neonatologist and NICU nurse.  No further PT needs at this time.    PT Assessment  Patent does not need any further PT services    Follow Up Recommendations  No PT follow up (Pt advised to monitor symptoms and seek PT if exacerbation)    Does the patient have the potential to tolerate intense rehabilitation      Barriers to Discharge        Equipment Recommendations  None recommended by PT    Recommendations for Other Services     Frequency      Precautions / Restrictions Precautions Precautions: None Restrictions Weight Bearing Restrictions: No   Pertinent Vitals/Pain no apparent distress       Mobility  Bed Mobility Bed Mobility: Supine to Sit;Sit to Supine Supine to Sit: 7: Independent Sit to Supine: 7: Independent Transfers Transfers: Sit to Stand;Stand to Sit Sit to Stand: 7: Independent Stand to Sit: 7: Independent Ambulation/Gait Ambulation/Gait Assistance: 7: Independent Ambulation Distance (Feet): 25  Feet Assistive device: None Gait Pattern: Step-through pattern General Gait Details: minimal to no toe drag noted and pt has minimal to no report of problems following 3 days solumedrol Stairs: No Wheelchair Mobility Wheelchair Mobility: No    Exercises     PT Diagnosis:    PT Problem List:   PT Treatment Interventions:       PT Goals(Current goals can be found in the care plan section) Acute Rehab PT Goals Patient Stated Goal: take care of new baby PT Goal Formulation: No goals set, d/c therapy  Visit Information  Last PT Received On: 04/06/13 Assistance Needed: +1       Prior Functioning  Home Living Family/patient expects to be discharged to:: Private residence Living Arrangements: Spouse/significant other Available Help at Discharge: Family;Available 24 hours/day Type of Home: Mobile home Home Access: Stairs to enter Entrance Stairs-Number of Steps: 5 Entrance Stairs-Rails: Can reach both Home Layout: One level Home Equipment: None Prior Function Level of Independence: Independent Communication Communication: No difficulties Dominant Hand: Right    Cognition  Cognition Arousal/Alertness: Awake/alert Behavior During Therapy: WFL for tasks assessed/performed Overall Cognitive Status: Within Functional Limits for tasks assessed    Extremity/Trunk Assessment Upper Extremity Assessment Upper Extremity Assessment: Overall WFL for tasks assessed Lower Extremity Assessment Lower Extremity Assessment: RLE deficits/detail RLE Deficits / Details: mild persistent weakness noting 3+/5 right df, otherwise intact   Balance    End of Session PT - End of Session Activity Tolerance: Patient tolerated treatment well Patient left: in chair;with family/visitor present Nurse Communication: Mobility status  GP     Dennis Bast 04/06/2013, 1:58 PM

## 2013-04-06 NOTE — Progress Notes (Signed)
Physical Therapy Discharge Patient Details Name: Haley [name] MRN: [id number] DOB: December 26, 1990 Today's Date: 04/06/2013 Time: 4098-1191 PT Time Calculation (min): 15 min  Patient discharged from PT services secondary to goals met and no further PT needs identified.  Please see latest therapy progress note for current level of functioning and progress toward goals.    Progress and discharge plan discussed with patient and/or caregiver: Patient/Caregiver agrees with plan  GP     Dennis Bast 04/06/2013, 2:02 PM

## 2013-04-07 ENCOUNTER — Other Ambulatory Visit: Payer: Self-pay

## 2013-04-07 LAB — CSF CULTURE W GRAM STAIN

## 2013-04-07 LAB — ANA: Anti Nuclear Antibody(ANA): NEGATIVE

## 2013-04-07 LAB — PATHOLOGIST SMEAR REVIEW

## 2013-04-07 MED ORDER — METHYLPREDNISOLONE 4 MG PO KIT: PACK | ORAL | Status: DC

## 2013-04-07 MED ORDER — ALPRAZOLAM 0.25 MG PO TABS: 0.2500 mg | ORAL_TABLET | Freq: Two times a day (BID) | ORAL | Status: DC | PRN

## 2013-04-07 NOTE — Discharge Summary (Signed)
Physician Discharge Summary  Haley [name] ZOX:[id number] DOB: 26-Feb-1991 DOA: 04/03/2013  PCP: Provider Not In System  Admit date: 04/03/2013 Discharge date: 04/07/2013  Time spent: 45 minutes  Recommendations for Outpatient Follow-up:  Patient will need followup with [facility] within one to 2 weeks of discharge. Patient also to followup with her primary care physician one week of discharge. Patient to continue taking her medications as prescribed.  Discharge Diagnoses:  Principal Problem:   Right foot drop Active Problems:   2014 Pregnancy w/ severe pre-e/HELLP, c/s @ 27wks   Postpartum depression   Discharge Condition: Stable  Diet recommendation: Regular  Filed Weights   04/03/13 1421 04/03/13 2200  Weight: 58.968 kg (130 lb) 59.7 kg (131 lb 9.8 oz)    History of present illness:  22 year old female who presented to the ED after patient had abnormal head CT done at outpatient imaging center. Patient recently received emergency C-section for severe preeclampsia on 01/27/2013 and today had a followup appointment with her OB/GYN. Patient complained about persistent gait abnormality in the right leg which has been going on for 2 weeks. She was sent for imaging as outpatient the head CT showed bilateral regions of vasogenic edema of indeterminate etiology and she was sent to the [facility] ED for an MRI. In the ED MRI brain was done which showed years of vasogenic edema and several areas suggest active demyelination suspicious for multiple sclerosis.  Hospital Course:  Is a 22 year old with a history of postpartum depression that presented to any hospital for a right foot drop and abnormal MRI. Patient was transferred to: For further neurological care. Patient was seen by Dr. Cyril Mourning and started on high-dose Solu-Medrol. She was noted to have multiple sclerosis noted on MRI of the brain as well as the spine. Patient did have a lumbar puncture. Pathology still  pending on her CSF. Patient received 3 doses of Solu-Medrol. She will be discharged on a Medrol Dosepak for one week. She is to follow up with [facility] in one to 2 weeks of discharge she should also continue following up with her primary care physician. Patient was instructed that she could continue breast feeding with Medrol Dosepak. Patient was also noted to have headache secondary to her lumbar puncture. This has resolved.  Procedures: Lumbar puncture by radiology on 04/04/2013  Consultations: Neurology  Discharge Exam: Filed Vitals:   04/07/13 1052  BP: 141/91  Pulse:   Temp:   Resp:     Exam  General: Well developed, well nourished, NAD, appears stated age  HEENT: NCAT, PERRLA, EOMI, Anicteic Sclera, mucous membranes moist. No pharyngeal erythema or exudates  Neck: Supple, no JVD, no masses  Cardiovascular: S1 S2 auscultated, no rubs, murmurs or gallops. Regular rate and rhythm.  Respiratory: Clear to auscultation bilaterally with equal chest rise  Abdomen: Soft, nontender, nondistended, + bowel sounds  Extremities: warm dry without cyanosis clubbing or edema  Neuro: AAOx3, cranial nerves grossly intact. Strength 5/5 in patient's upper and lower extremities bilaterally  Skin: Without rashes exudates or nodules  Psych: Normal affect and demeanor with intact judgement and insight   Discharge Instructions  Discharge Orders   Future Appointments Provider Department Dept Phone   04/09/2013 11:30 AM Jacklyn Shell, CNM [facility] [phone]   Future Orders Complete By Expires   Diet - low sodium heart healthy  As directed    Discharge instructions  As directed    Comments:     Patient will  need followup with [facility] within one to 2 weeks of discharge. Patient also to followup with her primary care physician one week of discharge. Patient to continue taking her medications as prescribed.   Increase activity  slowly  As directed        Medication List         escitalopram 10 MG tablet  Commonly known as:  LEXAPRO  Take 1 tablet (10 mg total) by mouth daily.     ferrous sulfate 325 (65 FE) MG tablet  Take 325 mg by mouth daily with breakfast.     methylPREDNISolone 4 MG tablet  Commonly known as:  MEDROL DOSEPAK  follow package directions       Allergies  Allergen Reactions  . Penicillins Other (See Comments)    Unknown childhood reaction       Follow-up Information   Follow up with [facility]. Schedule an appointment as soon as possible for a visit in 1 week.   Contact information:   [address] [place] [address] [phone]       The results of significant diagnostics from this hospitalization (including imaging, microbiology, ancillary and laboratory) are listed below for reference.    Significant Diagnostic Studies: Ct Head Wo Contrast  04/03/2013   CLINICAL DATA:  Postpartum with right leg weakness. 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 Postpartum depression.  EXAM: CT HEAD WITHOUT CONTRAST  TECHNIQUE: Contiguous axial images were obtained from the base of the skull through the vertex without intravenous contrast.  COMPARISON:  None.  FINDINGS: Low-density regions (vasogenic edema) predominantly subcortical white matter involving portions of the frontal lobes (greater on the left) and the left posterior temporal lobe/posterior left operculum/ periatrial region. Etiology indeterminate. This may be related to infection, inflammatory process or demyelinating process. Result of posterior reversible encephalopathy syndrome/ eclampsia not excluded although patient's delivery date was 01/27/2013. Multi focal infarction or tumor felt unlikely although not excluded. Result of venous infarct felt to be less likely consideration.  No intracranial hemorrhage.  No hydrocephalus.  IMPRESSION: Bilateral regions of vasogenic edema  of indeterminate etiology. MR of the brain with contrast recommended. Please see above discussion.  These results were called by telephone at the time of interpretation on 04/03/2013 at 2:00 PM to [facility] GRIFFIN physician's assistant, who verbally acknowledged these results. Patient is not acting infected per phone conversation. Patient is being scheduled for contrast enhanced MR of the brain.   Electronically Signed   By: Bridgett Larsson M.D.   On: 04/03/2013 14:14   Mr Laqueta Jean UX Contrast  04/03/2013   CLINICAL DATA:  Two months postpartum presenting with right foot drop. Abnormal CT. History of HELLP syndrome during pregnancy.  EXAM: MRI HEAD WITHOUT AND WITH CONTRAST  TECHNIQUE: Multiplanar, multiecho pulse sequences of the brain and surrounding structures were obtained without and with intravenous contrast.  CONTRAST:  12mL MULTIHANCE GADOBENATE DIMEGLUMINE 529 MG/ML IV SOLN  COMPARISON:  04/03/2013 head CT.  No comparison brain MR.  FINDINGS: Numerous intracranial lesions many which demonstrate enhancement involving supratentorial and infratentorial region. Index enhancing lesion within the posterior medial left frontal lobe measures 2 x 1.2 x 1.1 cm with marked surrounding vasogenic edema. White matter lesions extend into the corpus callosum. Findings in a patient of this age and gender are highly suspicious for demyelinating process and in particular multiple sclerosis. Acute disseminated encephalomyelitis (ADEM) cannot be excluded although a secondary consideration given the fact that  some lesions enhance and others do not enhance.  Infectious process could not be excluded in the proper clinical setting. On the diffusion-weighted sequence, majority of the lesions do not demonstrate restricted motion in a distribution suspicious or abscesses.  Metastatic disease could not be excluded in the proper clinical setting.  Result of inflammatory process/vasculitis although not entirely excluded are felt to be  less likely considerations.  Appearance is not typical for PRES syndrome or venous infarction.  No acute thrombotic infarct.  No abnormality noted involving the orbits or optic nerve.  Pituitary gland without evidence of hemorrhage or significant enlargement.  Prominent adenoidal tissue may be an incidental finding.  Decreased signal intensity of bone marrow may reflect result of anemia.  No hydrocephalus.  No intracranial hemorrhage.  Major intracranial vascular structures are patent.  On the series 9 image 16 the cervical cord appears slightly thickened at the C4 level which may reflect involvement by demyelinating process but is not adequately imaged on the present exam.  IMPRESSION: Markedly abnormal examination as detailed above suspicious for multiple sclerosis with other considerations as detailed above. This will require clinical and laboratory correlation to confirm. Other potential causes as noted above will need to be considered and excluded.  Several of the white matter lesions has surrounding vasogenic edema including medial aspect of the posterior left frontal lobe which may contribute to patient's foot drop. Other areas of vasogenic edema include right frontal lobe, left periatrial region and posterior left operculum region as well temporal lobe bilaterally. Several of these areas enhance suggesting active demyelination. Involvement is more notable in supratentorial region although there is posterior fossa involvement.  Involvement of the cervical cord C4 level may be present but incompletely assessed.  These results were called by telephone at the time of interpretation on 04/03/2013 at 5:38 PM to Dr. Juleen China, who verbally acknowledged these results.   Electronically Signed   By: Bridgett Larsson M.D.   On: 04/03/2013 17:52   Mr Cervical Spine W Wo Contrast  04/06/2013   CLINICAL DATA:  Multiple sclerosis.  EXAM: MRI CERVICAL AND THORACIC SPINE WITHOUT AND WITH CONTRAST  TECHNIQUE: Multiplanar and  multiecho pulse sequences of the cervical spine, to include the craniocervical junction and cervicothoracic junction, and thoracic spine, were obtained without and with intravenous contrast.  CONTRAST:  12mL MULTIHANCE GADOBENATE DIMEGLUMINE 529 MG/ML IV SOLN  COMPARISON:  MRI brain 04/03/2013  FINDINGS: MRI CERVICAL SPINE FINDINGS  Spinal cord lesion at the C4 level with mild enlargement of the cord. This is located centrally and ventrally in the midline. No enhancement of this lesion. No other cervical cord lesions are identified.  Mild disc degeneration at C4-5 and C5-6 without disc protrusion or spinal stenosis. No bone marrow lesion identified.  MRI THORACIC SPINE FINDINGS  1 cm central lesion in the spinal cord at T2-3 without enhancement.  6 mm cord lesion at T12 shows mild enhancement suggesting active demyelination. No other cord lesions.  No degenerative change.  No fracture or spinal stenosis.  IMPRESSION: Nonenhancing central lesion in the cord at C4 consistent with multiple sclerosis.  1 cm nonenhancing cord lesion at T2-3.  6 mm slightly enhancing cord lesion at T12. These lesions are also consistent with multiple sclerosis.   Electronically Signed   By: Marlan Palau M.D.   On: 04/06/2013 13:25   Mr Thoracic Spine W Wo Contrast  04/06/2013   CLINICAL DATA:  Multiple sclerosis.  EXAM: MRI CERVICAL AND THORACIC SPINE WITHOUT AND WITH CONTRAST  TECHNIQUE: Multiplanar and multiecho pulse sequences of the cervical spine, to include the craniocervical junction and cervicothoracic junction, and thoracic spine, were obtained without and with intravenous contrast.  CONTRAST:  12mL MULTIHANCE GADOBENATE DIMEGLUMINE 529 MG/ML IV SOLN  COMPARISON:  MRI brain 04/03/2013  FINDINGS: MRI CERVICAL SPINE FINDINGS  Spinal cord lesion at the C4 level with mild enlargement of the cord. This is located centrally and ventrally in the midline. No enhancement of this lesion. No other cervical cord lesions are identified.   Mild disc degeneration at C4-5 and C5-6 without disc protrusion or spinal stenosis. No bone marrow lesion identified.  MRI THORACIC SPINE FINDINGS  1 cm central lesion in the spinal cord at T2-3 without enhancement.  6 mm cord lesion at T12 shows mild enhancement suggesting active demyelination. No other cord lesions.  No degenerative change.  No fracture or spinal stenosis.  IMPRESSION: Nonenhancing central lesion in the cord at C4 consistent with multiple sclerosis.  1 cm nonenhancing cord lesion at T2-3.  6 mm slightly enhancing cord lesion at T12. These lesions are also consistent with multiple sclerosis.   Electronically Signed   By: Marlan Palau M.D.   On: 04/06/2013 13:25   Dg Fluoro Guide Lumbar Puncture  04/04/2013   CLINICAL DATA:  Abnormal MRI brain question multiple sclerosis  EXAM: DIAGNOSTIC LUMBAR PUNCTURE UNDER FLUOROSCOPIC GUIDANCE  FLUOROSCOPY TIME:  0 min 6 seconds  PROCEDURE: Informed consent was obtained from the patient prior to the procedure, including potential complications of headache, allergy, and pain. L4-L5 disc space was localized under fluoroscopy. With the patient prone, the lower back was prepped with Betadine. 1% Lidocaine was used for local anesthesia, 2.0 mL total. Lumbar puncture was performed at the L4-L5 level using a 22 gauge needle with return of clear colorless CSF with an opening pressure of 9 cm water (measured prone). 12 ml of CSF were obtained for laboratory studies. Patient tolerated the procedure well without immediate complication.  IMPRESSION: Fluoroscopic guided lumbar puncture as above.   Electronically Signed   By: Ulyses Southward M.D.   On: 04/04/2013 15:42    Microbiology: Recent Results (from the past 240 hour(s))  CSF CULTURE     Status: None   Collection Time    04/04/13 11:21 AM      Result Value Range Status   Specimen Description CSF   Final   Special Requests NONE   Final   Gram Stain     Final   Value: RARE WBC PRESENT,BOTH PMN AND  MONONUCLEAR     NO ORGANISMS SEEN     Performed at [facility]   Culture     Final   Value: NO GROWTH 3 DAYS     Performed at [facility]   Report Status 04/07/2013 FINAL   Final     Labs: Basic Metabolic Panel:  Recent Labs Lab 04/03/13 1145 04/03/13 2056 04/04/13 0604 04/06/13 0915  NA 142 142 141  --   K 4.1 3.2* 3.2* 4.0  CL 104 106 104  --   CO2 25 27 25   --   GLUCOSE 87 101* 104*  --   BUN 11 13 13   --   CREATININE 0.62 0.72 0.59  --   CALCIUM 9.6 9.1 10.1  --    Liver Function Tests:  Recent Labs Lab 04/03/13 1145 04/04/13 0604  AST 14 13  ALT 10 9  ALKPHOS 93 92  BILITOT 0.4 0.2*  PROT 6.5 6.7  ALBUMIN 4.2 3.4*   No results found for this basename: LIPASE, AMYLASE,  in the last 168 hours No results found for this basename: AMMONIA,  in the last 168 hours CBC:  Recent Labs Lab 04/03/13 1145 04/03/13 2056 04/04/13 0604  WBC 9.5 10.8* 9.2  NEUTROABS  --  6.2  --   HGB 13.1 12.2 12.5  HCT 39.1 37.1 39.0  MCV 81.5 85.3 86.7  PLT 332 278 270   Cardiac Enzymes: No results found for this basename: CKTOTAL, CKMB, CKMBINDEX, TROPONINI,  in the last 168 hours BNP: BNP (last 3 results) No results found for this basename: PROBNP,  in the last 8760 hours CBG: No results found for this basename: GLUCAP,  in the last 168 hours     Signed:  Edsel Petrin  [facility] 04/07/2013, 11:49 AM

## 2013-04-07 NOTE — Progress Notes (Signed)
NEURO HOSPITALIST PROGRESS NOTE   SUBJECTIVE:                                                                                                                        Stated that the right foot is getting stronger. MRI cervical spine with and without contrast: cord lesion at the C4 level with mild enlargement of the cord but no enhancement.. This is located centrally and ventrally in the midline. MRI thoracic spine with and without contrast: 1 cm central lesion in the spinal cord at T2-3 without enhancement.  6 mm cord lesion at T12 shows mild enhancement suggesting active demyelination. OCB and IGG index pending. On IV solumedrol, day #3.  OBJECTIVE:                                                                                                                           Vital signs in last 24 hours: Temp:  [97.7 F (36.5 C)-98.6 F (37 C)] 98.2 F (36.8 C) (11/24 1610) Pulse Rate:  [72-83] 75 (11/24 0613) Resp:  [18-20] 20 (11/24 0613) BP: (129-134)/(70-90) 132/90 mmHg (11/24 0613) SpO2:  [98 %-99 %] 99 % (11/24 0613)  Intake/Output from previous day:   Intake/Output this shift: Total I/O In: -  Out: 1 [Urine:1] Nutritional status: General  Past Medical History  Diagnosis Date  . Heart murmur   . Hx of chlamydia infection   . Abnormal Pap smear   . HSV-2 (herpes simplex virus 2) infection   . Pregnant   . Mental disorder     depression,postpartum  . Postpartum depression 04/03/2013  . Right foot drop 04/03/2013    Has - babinski, some muscle weakness  Noted when trying to curl toes    Neurologic Exam:  Mental Status:  Alert, oriented, thought content appropriate. Speech fluent without evidence of aphasia. Able to follow 3 step commands without difficulty.  Cranial Nerves:  II: Discs flat bilaterally; Visual fields grossly normal, pupils equal, round, reactive to light and accommodation  III,IV, VI: ptosis not present,  extra-ocular motions intact bilaterally  V,VII: smile symmetric, facial light touch sensation normal bilaterally  VIII: hearing normal bilaterally  IX,X: gag reflex present  XI: bilateral shoulder shrug  XII: midline tongue  extension without atrophy or fasciculations  Motor:  Significant for 4/5 weakness right dorsiflexors and right hamstring, knee extensor.  Tone and bulk:normal tone throughout; no atrophy noted  Sensory: Pinprick and light touch intact throughout, bilaterally  Deep Tendon Reflexes:  Hyperreflexia left biceps and right knee jerk greater than left, with right plantar clonus.  Plantars:  Right: downgoing Left: downgoing  Cerebellar:  normal finger-to-nose, normal heel-to-shin test  Gait:  No tested.  CV: pulses palpable throughout    Lab Results: No results found for this basename: cbc, bmp, coags, chol, tri, ldl, hga1c   Lipid Panel No results found for this basename: CHOL, TRIG, HDL, CHOLHDL, VLDL, LDLCALC,  in the last 72 hours  Studies/Results: Mr Cervical Spine W Wo Contrast  04/06/2013   CLINICAL DATA:  Multiple sclerosis.  EXAM: MRI CERVICAL AND THORACIC SPINE WITHOUT AND WITH CONTRAST  TECHNIQUE: Multiplanar and multiecho pulse sequences of the cervical spine, to include the craniocervical junction and cervicothoracic junction, and thoracic spine, were obtained without and with intravenous contrast.  CONTRAST:  12mL MULTIHANCE GADOBENATE DIMEGLUMINE 529 MG/ML IV SOLN  COMPARISON:  MRI brain 04/03/2013  FINDINGS: MRI CERVICAL SPINE FINDINGS  Spinal cord lesion at the C4 level with mild enlargement of the cord. This is located centrally and ventrally in the midline. No enhancement of this lesion. No other cervical cord lesions are identified.  Mild disc degeneration at C4-5 and C5-6 without disc protrusion or spinal stenosis. No bone marrow lesion identified.  MRI THORACIC SPINE FINDINGS  1 cm central lesion in the spinal cord at T2-3 without enhancement.  6 mm  cord lesion at T12 shows mild enhancement suggesting active demyelination. No other cord lesions.  No degenerative change.  No fracture or spinal stenosis.  IMPRESSION: Nonenhancing central lesion in the cord at C4 consistent with multiple sclerosis.  1 cm nonenhancing cord lesion at T2-3.  6 mm slightly enhancing cord lesion at T12. These lesions are also consistent with multiple sclerosis.   Electronically Signed   By: Marlan Palau M.D.   On: 04/06/2013 13:25   Mr Thoracic Spine W Wo Contrast  04/06/2013   CLINICAL DATA:  Multiple sclerosis.  EXAM: MRI CERVICAL AND THORACIC SPINE WITHOUT AND WITH CONTRAST  TECHNIQUE: Multiplanar and multiecho pulse sequences of the cervical spine, to include the craniocervical junction and cervicothoracic junction, and thoracic spine, were obtained without and with intravenous contrast.  CONTRAST:  12mL MULTIHANCE GADOBENATE DIMEGLUMINE 529 MG/ML IV SOLN  COMPARISON:  MRI brain 04/03/2013  FINDINGS: MRI CERVICAL SPINE FINDINGS  Spinal cord lesion at the C4 level with mild enlargement of the cord. This is located centrally and ventrally in the midline. No enhancement of this lesion. No other cervical cord lesions are identified.  Mild disc degeneration at C4-5 and C5-6 without disc protrusion or spinal stenosis. No bone marrow lesion identified.  MRI THORACIC SPINE FINDINGS  1 cm central lesion in the spinal cord at T2-3 without enhancement.  6 mm cord lesion at T12 shows mild enhancement suggesting active demyelination. No other cord lesions.  No degenerative change.  No fracture or spinal stenosis.  IMPRESSION: Nonenhancing central lesion in the cord at C4 consistent with multiple sclerosis.  1 cm nonenhancing cord lesion at T2-3.  6 mm slightly enhancing cord lesion at T12. These lesions are also consistent with multiple sclerosis.   Electronically Signed   By: Marlan Palau M.D.   On: 04/06/2013 13:25    MEDICATIONS  I have reviewed the patient's current medications.  ASSESSMENT/PLAN:                                                                                                           CIS/MS. She said she wants to have 5 days IV solumedrol because she is breast feeding and won't be able to initiate disease modifying therapy while breast feeding. I am okay with sending her home today with 1 week taper of prednisone if no contraindication with breast feeding. Explained to patient that she needs outpatient neurology follow up in the next 2 weeks. Will sign off. Please, call neurology with questions or concerns.  Wyatt Portela, MD [facility] [phone]  04/07/2013, 9:12 AM

## 2013-04-07 NOTE — Progress Notes (Addendum)
Patient stated she would not have transportation home until after 5pm; order approved to complete evening dose of IV solumedrol then discharge home with Rx's; medication only about half completed and patient c/o IV site pain; discontinued the IV access; patient waiting for her ride; wants to shower; called to the room after the shower stating she had chest pain and now a headache and neck pain; reports of pain intermittent last few days for headache only s/p lumbar puncture; all MD's aware of headache post lumbar puncture.  Denies any radiating pain from chest and is resolving now; night coverage paged; orders to repeat vital signs and EKG; patient was given Tylenol and Xanax earlier in the afternoon before the IV solumedrol prn for discomfort.

## 2013-04-07 NOTE — Progress Notes (Signed)
Pt just left the unit in a w/c upon d/c. D/c instructions given to pt.

## 2013-04-08 ENCOUNTER — Encounter (HOSPITAL_COMMUNITY): Payer: Self-pay | Admitting: Emergency Medicine

## 2013-04-08 ENCOUNTER — Emergency Department (HOSPITAL_COMMUNITY)
Admission: EM | Admit: 2013-04-08 | Discharge: 2013-04-09 | Disposition: A | Discharge status: Home / Self Care | Payer: Medicaid Other | Attending: Emergency Medicine | Admitting: Emergency Medicine

## 2013-04-08 ENCOUNTER — Telehealth: Payer: Self-pay | Admitting: *Deleted

## 2013-04-08 DIAGNOSIS — F53 Postpartum depression: Secondary | ICD-10-CM

## 2013-04-08 DIAGNOSIS — F3289 Other specified depressive episodes: Secondary | ICD-10-CM | POA: Insufficient documentation

## 2013-04-08 DIAGNOSIS — Z8619 Personal history of other infectious and parasitic diseases: Secondary | ICD-10-CM | POA: Insufficient documentation

## 2013-04-08 DIAGNOSIS — R011 Cardiac murmur, unspecified: Secondary | ICD-10-CM | POA: Insufficient documentation

## 2013-04-08 DIAGNOSIS — R42 Dizziness and giddiness: Secondary | ICD-10-CM | POA: Insufficient documentation

## 2013-04-08 DIAGNOSIS — F329 Major depressive disorder, single episode, unspecified: Secondary | ICD-10-CM | POA: Insufficient documentation

## 2013-04-08 DIAGNOSIS — Z880 Allergy status to penicillin: Secondary | ICD-10-CM | POA: Insufficient documentation

## 2013-04-08 DIAGNOSIS — Z79899 Other long term (current) drug therapy: Secondary | ICD-10-CM | POA: Insufficient documentation

## 2013-04-08 DIAGNOSIS — R5381 Other malaise: Secondary | ICD-10-CM | POA: Insufficient documentation

## 2013-04-08 DIAGNOSIS — Z87891 Personal history of nicotine dependence: Secondary | ICD-10-CM | POA: Insufficient documentation

## 2013-04-08 DIAGNOSIS — G971 Other reaction to spinal and lumbar puncture: Secondary | ICD-10-CM | POA: Insufficient documentation

## 2013-04-08 DIAGNOSIS — M21371 Foot drop, right foot: Secondary | ICD-10-CM

## 2013-04-08 DIAGNOSIS — IMO0002 Reserved for concepts with insufficient information to code with codable children: Secondary | ICD-10-CM | POA: Insufficient documentation

## 2013-04-08 DIAGNOSIS — M216X9 Other acquired deformities of unspecified foot: Secondary | ICD-10-CM | POA: Insufficient documentation

## 2013-04-08 MED ORDER — METHYLPREDNISOLONE 4 MG PO TABS
8.0000 mg | ORAL_TABLET | Freq: Once | ORAL | Status: AC
  Administered 2013-04-08: 8 mg via ORAL
  Filled 2013-04-08 (×2): qty 2

## 2013-04-08 MED ORDER — SODIUM CHLORIDE 0.9 % IV BOLUS (SEPSIS)
1000.0000 mL | Freq: Once | INTRAVENOUS | Status: AC
  Administered 2013-04-08: 1000 mL via INTRAVENOUS

## 2013-04-08 MED ORDER — OXYCODONE-ACETAMINOPHEN 5-325 MG PO TABS
2.0000 | ORAL_TABLET | Freq: Once | ORAL | Status: AC
  Administered 2013-04-08: 2 via ORAL
  Filled 2013-04-08: qty 2

## 2013-04-08 MED ORDER — ALPRAZOLAM 0.25 MG PO TABS
0.2500 mg | ORAL_TABLET | Freq: Once | ORAL | Status: AC
  Administered 2013-04-08: 0.25 mg via ORAL
  Filled 2013-04-08: qty 1

## 2013-04-08 NOTE — ED Provider Notes (Signed)
CSN: [id number]     Arrival date & time 04/08/13  2158 History   First MD Initiated Contact with Patient 04/08/13 2203     Chief Complaint  Patient presents with  . Migraine  . Hypertension   (Consider location/radiation/quality/duration/timing/severity/associated sxs/prior Treatment) HPI  Haley [name] is a 22 y.o. female  with a hx of heart murmur, postpartum depression, MS (curent right foot drop) presents to the Emergency Department complaining of gradual, persistent, generalized throbbing headache onset 5 days ago immediately post LP.  Pt reports she was taking percocet during her hospitalization which was helping significantly, but when she was discharged yesterday she was not given an Rx for any pain medication.  Patient reports that lying flat makes the headache better and sitting makes it worse. She has attempted Tylenol without relief.  Patient also reports associated photophobia.  Patient reports that tonight she had friends over and when she stood up quickly she became dizzy losing her balance.  Patient reports she did not have a loss of consciousness. She reports her friends called 911 and on arrival she was found to be hypertensive.  EMS reports that patient's initial blood pressure was 200/130 but on recheck had decreased to 120/90.  Patient denies fever, chills, neck pain, neck stiffness, rash, chest pain, shortness of breath, abdominal pain, nausea, vomiting, diarrhea, weakness, syncope, dysuria.  Past Medical History  Diagnosis Date  . Heart murmur   . Hx of chlamydia infection   . Abnormal Pap smear   . HSV-2 (herpes simplex virus 2) infection   . Pregnant   . Mental disorder     depression,postpartum  . Postpartum depression 04/03/2013  . Right foot drop 04/03/2013    Has - babinski, some muscle weakness  Noted when trying to curl toes   Past Surgical History  Procedure Laterality Date  . Wisdom tooth extraction  2012  . Cesarean section N/A 01/27/2013    Procedure:  CESAREAN SECTION;  Surgeon: Catalina Antigua, MD;  Location: [facility];  Service: Obstetrics;  Laterality: N/A;   Family History  Problem Relation Age of Onset  . Cancer Paternal Grandmother     breast  . Anxiety disorder Mother   . Heart disease Mother   . Stroke Paternal Aunt    History  Substance Use Topics  . Smoking status: Former Smoker    Types: Cigarettes    Quit date: 12/14/2010  . Smokeless tobacco: Never Used  . Alcohol Use: No   OB History   Grav Para Term Preterm Abortions TAB SAB Ect Mult Living   2 2 1 1      2      Review of Systems  Constitutional: Negative for fever, diaphoresis, appetite change, fatigue and unexpected weight change.  HENT: Negative for mouth sores.   Eyes: Negative for visual disturbance.  Respiratory: Negative for cough, chest tightness, shortness of breath and wheezing.   Cardiovascular: Negative for chest pain.  Gastrointestinal: Negative for nausea, vomiting, abdominal pain, diarrhea and constipation.  Endocrine: Negative for polydipsia, polyphagia and polyuria.  Genitourinary: Negative for dysuria, urgency, frequency and hematuria.  Musculoskeletal: Negative for back pain and neck stiffness.  Skin: Negative for rash.  Allergic/Immunologic: Negative for immunocompromised state.  Neurological: Positive for dizziness, weakness and headaches. Negative for syncope and light-headedness.  Hematological: Does not bruise/bleed easily.  Psychiatric/Behavioral: Negative for sleep disturbance. The patient is not nervous/anxious.     Allergies  Penicillins  Home Medications   Current Outpatient Rx  Name  Route  Sig  Dispense  Refill  . ALPRAZolam (XANAX) 0.25 MG tablet   Oral   Take 1 tablet (0.25 mg total) by mouth 2 (two) times daily as needed for anxiety.   10 tablet   0   . escitalopram (LEXAPRO) 10 MG tablet   Oral   Take 1 tablet (10 mg total) by mouth daily.   30 tablet   6   . methylPREDNISolone (MEDROL DOSEPAK) 4 MG tablet    Oral   Take 4-24 mg by mouth daily. Take 6, 5, 4, 3, 2, 1. Picked up on 04/07/13         . oxyCODONE-acetaminophen (PERCOCET/ROXICET) 5-325 MG per tablet   Oral   Take 1-2 tablets by mouth every 4 (four) hours as needed for severe pain.   21 tablet   0    BP 141/73  Pulse 102  Temp(Src) 98.8 F (37.1 C) (Oral)  Resp 16  SpO2 98%  LMP 04/03/2013 Physical Exam  Nursing note and vitals reviewed. Constitutional: She is oriented to person, place, and time. She appears well-developed and well-nourished. No distress.  HENT:  Head: Normocephalic and atraumatic.  Right Ear: Tympanic membrane, external ear and ear canal normal.  Left Ear: Tympanic membrane, external ear and ear canal normal.  Nose: Nose normal.  Mouth/Throat: Uvula is midline, oropharynx is clear and moist and mucous membranes are normal. Mucous membranes are not dry. No oropharyngeal exudate, posterior oropharyngeal edema, posterior oropharyngeal erythema or tonsillar abscesses.  Eyes: Conjunctivae and EOM are normal. Pupils are equal, round, and reactive to light. No scleral icterus.  Neck: Normal range of motion. Neck supple.  Cardiovascular: Normal rate, regular rhythm and intact distal pulses.   Murmur heard. Pulmonary/Chest: Effort normal and breath sounds normal. No respiratory distress. She has no wheezes. She has no rales.  Abdominal: Soft. Bowel sounds are normal. She exhibits no distension. There is no tenderness. There is no rebound and no guarding.  Musculoskeletal: Normal range of motion.  Lymphadenopathy:    She has no cervical adenopathy.  Neurological: She is alert and oriented to person, place, and time. She has normal reflexes. No cranial nerve deficit. She exhibits normal muscle tone. Coordination normal.  Speech is clear and goal oriented, follows commands Cranial nerves III - XII without deficit, no facial droop Normal strength in upper extremities bilaterally, strong and equal grip  strength Decreased strength in the RLE with 4/5 dorsiflexion and 5/5 dorsiflexion in the left Sensation normal to light and sharp touch Moves extremities without ataxia, coordination intact Normal finger to nose and rapid alternating movements Neg romberg, no pronator drift Normal gait Normal heel-shin and balance   Skin: Skin is warm and dry. No rash noted. She is not diaphoretic.  Psychiatric: She has a normal mood and affect. Her behavior is normal. Judgment and thought content normal.    ED Course  Procedures (including critical care time) Labs Review Labs Reviewed - No data to display Imaging Review No results found.  EKG Interpretation   None       MDM   1. Post lumbar puncture headache   2. Postpartum depression   3. Right foot drop      Christella Hartigan presents with post LP headache, worse with position changes. She reports no change since that time, but worse today due to not having any pain medication.    Record review shows the patient was admitted on 04/03/2013 for abnormal CT scan and MRI indicating EMS.  She was given IV steroids. On 04/04/2013 she had a lumbar puncture.  She reports she was to lie flat afterwards but did not.  Record review shows that patient had post lumbar puncture headache throughout her time in the hospital. She was discharged yesterday 04/07/2013 without pain medication.  Discussed with patient option for blood patch and she declines. We'll treat symptoms.  Patient without petechiae or nuchal rigidity; no concern for meningitis.  Pt is afebrile and nontachycardic.    12:31 AM Pt without orthostasis or HTN here in the department.  Significant improvement in headache with fluids and percocet.  Pt is tolerating PO without difficulty,  patient alert, oriented, nontoxic, nonseptic appearing. She is afebrile; non-tachycardic on reevaluation.  No indication for meningitis.  We'll discharge home with close followup her primary care physician  It  has been determined that no acute conditions requiring further emergency intervention are present at this time. The patient/guardian have been advised of the diagnosis and plan. We have discussed signs and symptoms that warrant return to the ED, such as changes or worsening in symptoms.   Patient/guardian has voiced understanding and agreed to follow-up with the PCP or specialist.      Dierdre Forth, PA-C 04/09/13 [phone]

## 2013-04-08 NOTE — Telephone Encounter (Signed)
Left message to cal back in am

## 2013-04-08 NOTE — ED Notes (Signed)
Per EMS, pt from home.  Pt at hospital until last pm.  Admitted at [facility] for MS.  Pt sent home with steroids and xanax.  Today with headache and elevated bp.  Light sensitive.  Vitals:  122/90, hr 78, 16. 96% ra.  Initial bp 200/130.

## 2013-04-08 NOTE — ED Notes (Signed)
Bed: ZO10 Expected date: 04/08/13 Expected time: 9:45 PM Means of arrival: Ambulance Comments: hypertension

## 2013-04-09 ENCOUNTER — Ambulatory Visit: Payer: Medicaid Other | Admitting: Advanced Practice Midwife

## 2013-04-09 ENCOUNTER — Telehealth: Payer: Self-pay | Admitting: *Deleted

## 2013-04-09 LAB — MULTIPLE SCLEROSIS PANEL 2
Albumin CSF: 19 mg/dL (ref <35)
Albumin Index: 4.8 (ref <9.0)
CNS-IgG Synthesis Rate: 15.4 mg/24hr — ABNORMAL HIGH (ref <3.3)
IgA CSF: 0.22 mg/dL (ref 0.15–0.60)
IgA MSPROF: <0.001 mg/dL (ref <0.010)
IgA Total: 166 mg/dL (ref 81–463)
IgG: 2.88 mg/dL — ABNORMAL HIGH (ref <0.10)
IgM MSPROF: 0.041 mg/dL — ABNORMAL HIGH (ref <0.010)
IgM-CSF: 0.14 mg/dL — ABNORMAL HIGH (ref <0.10)
Myelin basic protein, csf: 2.3 mcg/L (ref <4.1)

## 2013-04-09 LAB — VDRL, CSF: VDRL Quant, CSF: NONREACTIVE

## 2013-04-09 MED ORDER — OXYCODONE-ACETAMINOPHEN 5-325 MG PO TABS: 1.0000 | ORAL_TABLET | ORAL | Status: DC | PRN

## 2013-04-09 NOTE — Telephone Encounter (Signed)
Spoke with pt. She is being worked up for [employer]. She is on the Medrol Dose Pack. Pt states she feels like her breast milk supply is going down and wants to increase it. Spoke with JAG. With her being worked up for MS, just increase pumpings and increase fluids. Pt voiced understanding. JSY

## 2013-04-09 NOTE — ED Provider Notes (Signed)
Medical screening examination/treatment/procedure(s) were performed by non-physician practitioner and as supervising physician I was immediately available for consultation/collaboration.  EKG Interpretation   None        Shon Baton, MD 04/09/13 1024

## 2013-04-12 ENCOUNTER — Emergency Department (HOSPITAL_COMMUNITY)
Admission: EM | Admit: 2013-04-12 | Discharge: 2013-04-13 | Disposition: A | Discharge status: Home / Self Care | Payer: Medicaid Other | Attending: Emergency Medicine | Admitting: Emergency Medicine

## 2013-04-12 ENCOUNTER — Encounter (HOSPITAL_COMMUNITY): Payer: Self-pay | Admitting: Emergency Medicine

## 2013-04-12 DIAGNOSIS — Z8619 Personal history of other infectious and parasitic diseases: Secondary | ICD-10-CM | POA: Insufficient documentation

## 2013-04-12 DIAGNOSIS — Z79899 Other long term (current) drug therapy: Secondary | ICD-10-CM | POA: Insufficient documentation

## 2013-04-12 DIAGNOSIS — Z8669 Personal history of other diseases of the nervous system and sense organs: Secondary | ICD-10-CM | POA: Insufficient documentation

## 2013-04-12 DIAGNOSIS — Z8739 Personal history of other diseases of the musculoskeletal system and connective tissue: Secondary | ICD-10-CM | POA: Insufficient documentation

## 2013-04-12 DIAGNOSIS — Z87891 Personal history of nicotine dependence: Secondary | ICD-10-CM | POA: Insufficient documentation

## 2013-04-12 DIAGNOSIS — Z880 Allergy status to penicillin: Secondary | ICD-10-CM | POA: Insufficient documentation

## 2013-04-12 DIAGNOSIS — F410 Panic disorder [episodic paroxysmal anxiety] without agoraphobia: Secondary | ICD-10-CM | POA: Insufficient documentation

## 2013-04-12 DIAGNOSIS — R011 Cardiac murmur, unspecified: Secondary | ICD-10-CM | POA: Insufficient documentation

## 2013-04-12 HISTORY — DX: Multiple sclerosis: G35

## 2013-04-12 MED ORDER — ALPRAZOLAM 0.25 MG PO TABS: 0.2500 mg | ORAL_TABLET | Freq: Two times a day (BID) | ORAL | Status: DC | PRN

## 2013-04-12 NOTE — ED Notes (Addendum)
Pt states she is having trouble concentrating at this time. Pt is anxious at this time. Pt states has decreased sensation in legs. Pt has good strength in upper est noted weakness to the left lower ext. Pt is able to move ext w/ no problems.

## 2013-04-12 NOTE — ED Provider Notes (Addendum)
CSN: [id number]     Arrival date & time 04/12/13  2306 History  This chart was scribed for Lyanne Co, MD by Joaquin Music, ED Scribe. This patient was seen in room APA04/APA04 and the patient's care was started at 11:30 PM  Chief Complaint  Patient presents with  . Altered Mental Status   The history is provided by the patient. No language interpreter was used.   HPI Comments: Haley [name] is a 22 y.o. female who presents to the Emergency Department complaining of altered mental status and tingling to extremities that began today. She states she currently is having SOB. Pt states she was diagnosed with MS in the last week. Pt states she was feeling tingling in bilateral hands and bilateral lower extremities.  She states she has felt she had to remind herself to breath. Pt states she took a Zanax PTA. Pt denies chest tightness.  Past Medical History  Diagnosis Date  . Heart murmur   . Hx of chlamydia infection   . Abnormal Pap smear   . HSV-2 (herpes simplex virus 2) infection   . Pregnant   . Mental disorder     depression,postpartum  . Postpartum depression 04/03/2013  . Right foot drop 04/03/2013    Has - babinski, some muscle weakness  Noted when trying to curl toes  . Multiple sclerosis    Past Surgical History  Procedure Laterality Date  . Wisdom tooth extraction  2012  . Cesarean section N/A 01/27/2013    Procedure: CESAREAN SECTION;  Surgeon: Catalina Antigua, MD;  Location: [facility];  Service: Obstetrics;  Laterality: N/A;   Family History  Problem Relation Age of Onset  . Cancer Paternal Grandmother     breast  . Anxiety disorder Mother   . Heart disease Mother   . Stroke Paternal Aunt    History  Substance Use Topics  . Smoking status: Former Smoker    Types: Cigarettes    Quit date: 12/14/2010  . Smokeless tobacco: Never Used  . Alcohol Use: No   OB History   Grav Para Term Preterm Abortions TAB SAB Ect Mult Living   2 2 1 1      2       Review of Systems A complete 10 system review of systems was obtained and all systems are negative except as noted in the HPI and PMH.   Allergies  Penicillins  Home Medications   Current Outpatient Rx  Name  Route  Sig  Dispense  Refill  . escitalopram (LEXAPRO) 10 MG tablet   Oral   Take 1 tablet (10 mg total) by mouth daily.   30 tablet   6   . methylPREDNISolone (MEDROL DOSEPAK) 4 MG tablet   Oral   Take 4-24 mg by mouth daily. Take 6, 5, 4, 3, 2, 1. Picked up on 04/07/13         . oxyCODONE-acetaminophen (PERCOCET/ROXICET) 5-325 MG per tablet   Oral   Take 1-2 tablets by mouth every 4 (four) hours as needed for severe pain.   21 tablet   0   . ALPRAZolam (XANAX) 0.25 MG tablet   Oral   Take 1 tablet (0.25 mg total) by mouth 2 (two) times daily as needed for anxiety.   16 tablet   0    BP 149/98  Pulse 96  Temp(Src) 97.5 F (36.4 C) (Oral)  Resp 20  Ht 5\' 1"  (1.549 m)  Wt 130 lb (58.968 kg)  BMI  24.58 kg/m2  SpO2 95%  LMP 04/03/2013  Physical Exam  Nursing note and vitals reviewed. Constitutional: She is oriented to person, place, and time. She appears well-developed and well-nourished.  HENT:  Head: Normocephalic and atraumatic.  Eyes: Pupils are equal, round, and reactive to light.  Cardiovascular: Regular rhythm.   No carotid bruits  Pulmonary/Chest: Effort normal.  Abdominal: Soft.  Musculoskeletal: Normal range of motion.  Neurological: She is alert and oriented to person, place, and time.  5/5 strength in major muscle groups of  bilateral upper and lower extremities. Speech normal. No facial asymetry.    ED Course  Procedures DIAGNOSTIC STUDIES: Oxygen Saturation is 95% on RA, normal by my interpretation.    COORDINATION OF CARE: 11:38 PM-Discussed treatment plan which includes monitor pt while in ED. Pt agreed to plan.   Labs Review Labs Reviewed - No data to display Imaging Review No results found.  EKG Interpretation   None        MDM   1. Anxiety attack    This.  3 panic attack.  She feels much better this time after taking her Xanax at home.  She will need outpatient neurology followup as recommended at time of her discharge.  Patient has a lot of stress with a new diagnosis of MS as well as a younger 60-month-old in the NICU currently.  Patient be prescribed a short course of Xanax when necessary as an outpatient for anxiety.  Close followup.  I personally performed the services described in this documentation, which was scribed in my presence. The recorded information has been reviewed and is accurate.      Lyanne Co, MD 04/12/13 2357  Lyanne Co, MD 04/12/13 [phone]

## 2013-04-12 NOTE — ED Notes (Signed)
Pt recently diagnosed with M.S., states she was dc'd from [facility] hospital on Monday, states today she is feeling confused and has had some numbness and tingling in her arms and legs.  Pt is very anxious, hyperventilating at times.

## 2013-04-13 NOTE — ED Notes (Signed)
Pt alert & oriented x4, stable gait. Patient given discharge instructions, paperwork & prescription(s). Patient  instructed to stop at the registration desk to finish any additional paperwork. Patient verbalized understanding. Pt left department w/ no further questions. 

## 2013-04-15 ENCOUNTER — Telehealth: Payer: Self-pay | Admitting: *Deleted

## 2013-04-15 NOTE — Telephone Encounter (Signed)
Try fenugreek and increase calories and increase fluids and pump every 2 hours and talk with lactation consultant.Not taking any treatment at present for MS, she said they told her she would wait til not breastfeeding.

## 2013-04-16 ENCOUNTER — Ambulatory Visit: Payer: Medicaid Other | Admitting: Advanced Practice Midwife

## 2013-04-18 ENCOUNTER — Ambulatory Visit: Payer: Medicaid Other | Admitting: Neurology

## 2013-04-21 ENCOUNTER — Ambulatory Visit: Payer: Medicaid Other | Admitting: Neurology

## 2013-05-02 ENCOUNTER — Encounter: Payer: Self-pay | Admitting: Neurology

## 2013-05-02 ENCOUNTER — Ambulatory Visit (INDEPENDENT_AMBULATORY_CARE_PROVIDER_SITE_OTHER): Payer: Medicaid Other | Admitting: Neurology

## 2013-05-02 VITALS — BP 133/83 | HR 98 | Ht 61.0 in | Wt 131.0 lb

## 2013-05-02 DIAGNOSIS — G35 Multiple sclerosis: Secondary | ICD-10-CM

## 2013-05-02 NOTE — Progress Notes (Addendum)
[facility]  PATIENT: Haley [name] DOB: August 28, 1990  HISTORICAL  Adelise is a 22 years old right-handed Caucasian female, referred by her obstetrician for evaluation of abnormal MRI of brain, consistent with multiple sclerosis  She was previously healthy, Uneventful pregnancy 6 years ago, She has a normal healthy child at 53 years old, doing her most recent Pregnancy, at [redacted] weeks pregnant, she suddenly developed lower abdominal pain, presented to the emergency room, was found to have elevated blood pressure 250 over 130, acute liver failure, elevated AST 1200, she had emergency C-section, her daughter was born at 79 weeks 3 days, she was diagnosed with eclampsia, her blood pressure, and laboratory evaluation has much improved afterwards.   During her postpartum followup in April 03 2013, she reported a week history of right foot drop, she was referred for MRI of brain, which demonstrated Numerous intracranial lesions many which demonstrate enhancement involving supratentorial and infratentorial region. Index enhancing  lesion within the posterior medial left frontal lobe measures 2 x 1.2 x 1.1 cm with marked surrounding vasogenic edema. White matter lesions extend into the corpus callosum.  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 This is located centrally and ventrally in the midline. No  enhancement of this lesion. No other cervical cord lesions are identified. MRI thoracic spine showed 1 cm nonenhancing cord lesion at T2-3. 6 mm slightly enhancing cord lesion at T12  Laboratory showed normal and active HIV, RPR, ANA, ESR, CSF showed total protein 35, pulse 58, negative VDRL, more than 5 oligoclonal banding, WBC 13, RBC 3,  She complains of difficulty focusing, anxiety attacks, a week history of bilateral lower  extremity deep achy pain, she denies visual loss, denies gait difficulty  JC virus antibody (05/03/2013) was positive with titer of 0.16   REVIEW OF SYSTEMS: Full 14 system review of systems performed and notable only for fatigue, heat intolerance, palpitation, restless leg, insomnia, daytime sleepiness, dizziness, numbness, speech difficulty, confusion, depression, anxiety  ALLERGIES: Allergies  Allergen Reactions  . Penicillins Other (See Comments)    Unknown childhood reaction    HOME MEDICATIONS: Outpatient Prescriptions Prior to Visit  Medication Sig Dispense Refill  . ALPRAZolam (XANAX) 0.25 MG tablet Take 1 tablet (0.25 mg total) by mouth 2 (two) times daily as needed for anxiety.  16 tablet  0  . escitalopram (LEXAPRO) 10 MG tablet Take 1 tablet (10 mg total) by mouth daily.  30 tablet  6  . oxyCODONE-acetaminophen (PERCOCET/ROXICET) 5-325 MG per tablet Take 1-2 tablets by mouth every 4 (four) hours as needed for severe pain.  21 tablet  0     PAST MEDICAL HISTORY: Past Medical History  Diagnosis Date  . Heart murmur   . Hx of chlamydia infection   . Abnormal Pap smear   . HSV-2 (herpes simplex virus 2) infection   . Mental disorder   . Postpartum depression 04/03/2013  . Right foot drop 04/03/2013  . Multiple sclerosis     PAST SURGICAL HISTORY: Past Surgical History  Procedure Laterality Date  . Wisdom tooth extraction  2012  . Cesarean section N/A 01/27/2013    Procedure: CESAREAN SECTION;  Surgeon: Catalina Antigua, MD;  Location: [facility];  Service: Obstetrics;  Laterality: N/A;    FAMILY HISTORY: Family History  Problem Relation Age of Onset  .  Cancer Paternal Grandmother     breast  . Anxiety disorder Mother   . Heart disease Mother   . Stroke Paternal Aunt     SOCIAL HISTORY:  History   Social History  . Marital Status: Single    Spouse Name: N/A    Number of Children: 2  . Years of Education: GED+   Occupational History    unemployed     Archivist   Social History Main Topics  . Smoking status: Former Smoker    Types: Cigarettes    Quit date: 12/14/2010  . Smokeless tobacco: Never Used  . Alcohol Use: No  . Drug Use: No  . Sexual Activity: Not Currently    Birth Control/ Protection: None   Other Topics Concern  . Not on file   Social History Narrative   Pt lives home with finance ([place]) and two children   Pt is right handed   Education GED, but is currently a part time student   Pt may consume one cup of caffeine on a daily basis     PHYSICAL EXAM   Filed Vitals:   05/02/13 1348  BP: 133/83  Pulse: 98  Height: 5\' 1"  (1.549 m)  Weight: 131 lb (59.421 kg)    Not recorded    Body mass index is 24.76 kg/(m^2).   Generalized: In no acute distress  Neck: Supple, no carotid bruits   Cardiac: Regular rate rhythm  Pulmonary: Clear to auscultation bilaterally  Musculoskeletal: No deformity  Neurological examination  Mentation: Alert oriented to time, place, history taking, and causual conversation  Cranial nerve II-XII: Pupils were equal round reactive to light extraocular movements were full, Visual field were full on confrontational test. Bilateral fundi were sharp.  Facial sensation and strength were normal. Hearing was intact to finger rubbing bilaterally. Uvula tongue midline.  head turning and shoulder shrug and were normal and symmetric.Tongue protrusion into cheek strength was normal.  Motor: normal tone, bulk and strength.  Sensory: Intact to fine touch, pinprick, preserved vibratory sensation, and proprioception at toes.  Coordination: Normal finger to nose, heel-to-shin bilaterally there was no truncal ataxia  Gait: Rising up from seated position without assistance, normal stance, without trunk ataxia, moderate stride, good arm swing, smooth turning, able to perform tiptoe, and heel walking without difficulty.   Romberg signs: Negative  Deep tendon reflexes: Brachioradialis  2/2, biceps 2/2, triceps 2/2, patellar 3/3, Achilles 2/2, plantar responses were flexor bilaterally.   DIAGNOSTIC DATA (LABS, IMAGING, TESTING) - I reviewed patient records, labs, notes, testing and imaging myself where available.  Lab Results  Component Value Date   WBC 9.2 04/04/2013   HGB 12.5 04/04/2013   HCT 39.0 04/04/2013   MCV 86.7 04/04/2013   PLT 270 04/04/2013      Component Value Date/Time   NA 141 04/04/2013 0604   K 4.0 04/06/2013 0915   CL 104 04/04/2013 0604   CO2 25 04/04/2013 0604   GLUCOSE 104* 04/04/2013 0604   BUN 13 04/04/2013 0604   CREATININE 0.59 04/04/2013 0604   CREATININE 0.62 04/03/2013 1145   CALCIUM 10.1 04/04/2013 0604   PROT 6.7 04/04/2013 0604   ALBUMIN 3.4* 04/04/2013 0604   AST 13 04/04/2013 0604   ALT 9 04/04/2013 0604   ALKPHOS 92 04/04/2013 0604   BILITOT 0.2* 04/04/2013 0604   GFRNONAA >90 04/04/2013 0604   GFRAA >90 04/04/2013 0604   No results found for this basename: CHOL, HDL, LDLCALC, LDLDIRECT, TRIG, CHOLHDL  No results found for this basename: HGBA1C   Lab Results  Component Value Date   VITAMINB12 603 04/04/2013   Lab Results  Component Value Date   TSH 1.620 04/03/2013      ASSESSMENT AND PLAN   22 years old female, postpartum, developed acute right foot drop, we have reviewed her MRIs together, marked abnormal MRI of the brain, cervical, thoracic spine, spinal fluid showed more than 5 oligoclonal banding, consistent with relapsing remitting multiple sclerosis,    1. complete evaluation with visual evoked potential 2.  I have spent extensive time explaining to her the diagnosis of multiple sclerosis, potential treatment options, 3. complete laboratory evaluations, including JC virus titer, varicella-zoster antibody   4 return to clinic in 2 or 3 weeks with her family, She has a very aggressive form of multiple sclerosis, I will start with [place]    Levert Feinstein, M.D. Ph.D.  [facility] [address] [place], [place] [address] [phone] and

## 2013-05-05 LAB — IFE AND PE, SERUM
Albumin/Glob SerPl: 1.4 (ref 0.7–2.0)
Alpha2 Glob SerPl Elph-Mcnc: 1 g/dL (ref 0.4–1.2)
B-Globulin SerPl Elph-Mcnc: 1.1 g/dL (ref 0.6–1.3)
Globulin, Total: 3.1 g/dL (ref 2.0–4.5)
IgA/Immunoglobulin A, Serum: 170 mg/dL (ref 91–414)
IgG (Immunoglobin G), Serum: 833 mg/dL (ref 700–1600)
IgM (Immunoglobulin M), Srm: 128 mg/dL (ref 40–230)
Total Protein: 7.3 g/dL (ref 6.0–8.5)

## 2013-05-05 LAB — ANA W/REFLEX IF POSITIVE: Anti Nuclear Antibody(ANA): NEGATIVE

## 2013-05-05 LAB — HEPATITIS PANEL, ACUTE: Hep C Virus Ab: <0.1 s/co ratio (ref 0.0–0.9)

## 2013-05-05 LAB — ANGIOTENSIN CONVERTING ENZYME: Angio Convert Enzyme: 40 U/L (ref 14–82)

## 2013-05-13 ENCOUNTER — Telehealth: Payer: Self-pay

## 2013-05-13 NOTE — Telephone Encounter (Signed)
Zharia said she got letter from [facility] denying MRI, bring letter by

## 2013-05-23 ENCOUNTER — Ambulatory Visit: Payer: Medicaid Other | Admitting: Neurology

## 2013-06-02 ENCOUNTER — Ambulatory Visit: Payer: Medicaid Other | Admitting: Neurology

## 2013-06-04 ENCOUNTER — Telehealth: Payer: Self-pay | Admitting: Neurology

## 2013-06-04 NOTE — Telephone Encounter (Signed)
Dana:  Please call patient, she needs a followup appointment in my next available, in one to 2 weeks

## 2013-06-05 ENCOUNTER — Telehealth: Payer: Self-pay

## 2013-06-05 NOTE — Telephone Encounter (Signed)
Called patient and spoke to her trying to schedule her for appt. In two weeks with Dr.Yan. Patient stated her child was in the hospital and she would give the office a call back and we could schedule her apt.

## 2013-08-08 ENCOUNTER — Ambulatory Visit: Payer: Medicaid Other | Admitting: Neurology

## 2013-08-13 ENCOUNTER — Telehealth: Payer: Self-pay | Admitting: Neurology

## 2013-08-13 DIAGNOSIS — G35 Multiple sclerosis: Secondary | ICD-10-CM

## 2013-08-13 NOTE — Telephone Encounter (Signed)
Pt's boyfriend called and would like [place] to make an appointment for the Pt. The Pt is nervous about coming/treatment.  He father is also concerned.  She has been having flare ups, her legs went numb 2 days ago and her hands are getting numb and she is dropping things. When she gets up she limps and can only go a few steps before having to sit down.  Both the boyfriend and her father are very concerned about the problems she is having with her vision.  She is not able to see very much.  Please call [phone] to discuss and see when appointment can be made.  Let boyfriend know that Dr. Terrace Arabia is out until the 15th. Please call.  Thank you

## 2013-08-14 ENCOUNTER — Telehealth: Payer: Self-pay | Admitting: Neurology

## 2013-08-14 NOTE — Telephone Encounter (Signed)
I called patient. The patient began noting pain with movement of the right eye 4 days ago, there is some blurring of vision. The patient likely has an optic neuritis. I will order a visual evoked response test, hopefully can get this done tomorrow. I have asked the patient to be present in office around 12 noon tomorrow, I will see her then. We will try to get a dose of Solu-Medrol in at that time.

## 2013-08-14 NOTE — Telephone Encounter (Signed)
Patient's boyfriend calling to state that he would like a note for missing work tomorrow so that he can stay with his girlfriend after she has the Solumedrol tomorrow. Patient's boyfriend states that last time she had it she experienced hallucinations and was out of it, and they have a newborn baby and she does not want to be left alone with them in case it happens again. Please call and advise patient.

## 2013-08-14 NOTE — Telephone Encounter (Signed)
ERROR

## 2013-08-14 NOTE — Telephone Encounter (Signed)
If the patient has had hallucinations with Solu-Medrol in the past, this may alter how we treat her. I may need to use a much lower dose. I can generate a note tomorrow when I see the patient.

## 2013-08-15 ENCOUNTER — Telehealth: Payer: Self-pay | Admitting: Neurology

## 2013-08-15 ENCOUNTER — Encounter: Payer: Self-pay | Admitting: Neurology

## 2013-08-15 ENCOUNTER — Ambulatory Visit (INDEPENDENT_AMBULATORY_CARE_PROVIDER_SITE_OTHER): Payer: Medicaid Other | Admitting: Neurology

## 2013-08-15 ENCOUNTER — Ambulatory Visit (INDEPENDENT_AMBULATORY_CARE_PROVIDER_SITE_OTHER): Payer: Medicaid Other | Admitting: *Deleted

## 2013-08-15 ENCOUNTER — Ambulatory Visit (INDEPENDENT_AMBULATORY_CARE_PROVIDER_SITE_OTHER): Payer: Medicaid Other

## 2013-08-15 VITALS — BP 131/90 | HR 99 | Wt 122.0 lb

## 2013-08-15 DIAGNOSIS — H469 Unspecified optic neuritis: Secondary | ICD-10-CM

## 2013-08-15 DIAGNOSIS — G35 Multiple sclerosis: Secondary | ICD-10-CM

## 2013-08-15 MED ORDER — ALPRAZOLAM 0.25 MG PO TABS: 0.2500 mg | ORAL_TABLET | Freq: Two times a day (BID) | ORAL | Status: DC | PRN

## 2013-08-15 MED ORDER — SODIUM CHLORIDE 0.9 % IV SOLN
500.0000 mg | INTRAVENOUS | Status: DC
  Administered 2013-08-15: 500 mg via INTRAVENOUS

## 2013-08-15 MED ORDER — DEXAMETHASONE 0.5 MG PO TABS: ORAL_TABLET | ORAL | Status: DC

## 2013-08-15 NOTE — Addendum Note (Signed)
Addended by: Stephanie Acre on: 08/15/2013 06:34 PM   Modules accepted: Orders

## 2013-08-15 NOTE — Telephone Encounter (Signed)
I will call in a prescription for the alprazolam.

## 2013-08-15 NOTE — Telephone Encounter (Signed)
I called patient. The visual evoked response test was normal. There is no evidence of optic neuritis on either side. The patient will complete her Solu-Medrol course throughout the weekend, but if the eye pain continues, she is to see an ophthalmologist to exclude other etiologies such as glaucoma. The patient is not to go on the dexamethasone tablets.

## 2013-08-15 NOTE — Telephone Encounter (Signed)
Spoke to pharmacy at [facility].  Could not staff for the weekend infusion.   Faxed to [facility]

## 2013-08-15 NOTE — Progress Notes (Signed)
Pt here seeing Dr. Anne HahnWillis for appt.  Order for Solumedrol 500mg  IV Day 1/3.  Pt taken from Room 12 to treatment room.  Made comfortable in recliner.  Instructed about plan for IV infusion today and over the weekend to [facility]  Under aseptic technique 24g angiocath inserted to R inner forearm with good blood return, bruising noted at insertion site.  Taped securely, and IV solumedrol 500mg / 100ml NS started at 1313.  Children and boyfriend brought to treatment room.  Pt asked about medication for anxiety.  Relayed to Dr. Anne HahnWillis and he stated he would call in.  MS information given to boyfriend, also mentioned MS society.  At 1329 NS flush placed.  At 1334 IV made to NSL.  Flushes well.  Taped and then wrapped with auto-adhesive.  Pt tolerated well.  Pt then taken for VER by [place].  Explained when returned to treatment room, that will call her with status of [facility] or other [facility] re: continued Solumedrol infusions for Sat and Sun.  ( [facility] was able to do these).  Pt was called by them at 1600.

## 2013-08-15 NOTE — Patient Instructions (Signed)
Multiple Sclerosis  Multiple sclerosis (MS) is a disease of the central nervous system. It leads to loss of the insulating covering of the nerves (myelin sheath) of your brain. When this happens, brain signals do not get transmitted properly or may not get transmitted at all. The symptoms of MS occur in episodes or attacks. These attacks may last weeks to months. There may be long periods of nearly no problems between attacks. The age of onset of MS varies.   CAUSES  The cause of MS is unknown. However, it is more common in the [place] than in the southern [place].  RISK FACTORS  There is a higher incidence of MS in women than in men. MS is not an inherited illness, although your risk of MS is higher if you have a relative with MS.  SIGNS AND SYMPTOMS   The symptoms of MS occur in episodes or attacks. These attacks may last weeks to months. There may be long periods of almost no symptoms between attacks.  The symptoms of MS vary. This is because of the many different ways it affects the central nervous system. The main symptoms of MS include:   Vision problems and eye pain.   Numbness.   Weakness.   Paralysis in your arms, hands, feet, and legs (extremities).   Balance problems.   Tremors.  DIAGNOSIS   Your health care provider can diagnose MS with the help of imaging exams and lab tests. These may include specialized X-ray exams and spinal fluid tests. The best imaging exam to confirm a diagnosis of MS is MRI.  TREATMENT   There is no known cure for MS, but there are medicines that can decrease the number and frequency of attacks. Steroids are often used for short-term relief. Physical and occupational therapy may also help.  HOME CARE INSTRUCTIONS    Take medicines as directed by your health care provider.   Exercise as directed by your health care provider.  SEEK MEDICAL CARE IF:  You begin to feel depressed.  SEEK IMMEDIATE MEDICAL CARE IF:   You develop paralysis.   You develop  problems with bladder, bowel, or sexual function.   You develop mental changes, such as forgetfulness or mood swings.   You have a seizure.  Document Released: 04/28/2000 Document Revised: 02/19/2013 Document Reviewed: 01/06/2013  [employer] Patient Information 2014 [employer].

## 2013-08-15 NOTE — Procedures (Signed)
    History:   Haley [name] is a 23 year old patient with a history of multiple sclerosis, currently not on any disease modifying agents. The patient has a five-day history of pain with movement of the right eye, and blurring of the right eye. The patient is being evaluated for a possible optic neuritis.  Description: The visual evoked response test was performed today using 32 x 32 check sizes. The absolute latencies for the N1 and the P100 wave forms were within normal limits bilaterally. The amplitudes for the P100 wave forms were also within normal limits bilaterally. The visual acuity was 20/30 OD and 20/30 OS uncorrected.  Impression:  The visual evoked response test above was within normal limits bilaterally. No evidence of conduction slowing was seen within the anterior visual pathways on either side on today's evaluation.

## 2013-08-15 NOTE — Telephone Encounter (Signed)
[facility] is able to do the last infusions (sat and sun).  Pt made aware and shew verbalized understanding.

## 2013-08-15 NOTE — Progress Notes (Signed)
Reason for visit: Multiple sclerosis  Haley [name] is an 23 y.o. 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 The patient has had multiple spinal cord and brain lesions, with ring-enhancing lesions in the brain. The patient currently is breast-feeding, and she has not been started on any disease modifying agents. The patient is to go on Tysabri in the near future. The patient developed onset of right eye discomfort with movement of the eye, and some blurring of vision of the right eye 5 days prior to this evaluation. The patient in the past has had hallucinations on prednisone. The patient has tolerated Solu-Medrol well. The patient denies any new numbness or weakness of the extremities, any gait disturbance, bowel or bladder problems, or double vision recently. The patient comes in for further evaluation. The patient does note some problems with cognitive slowing that have been persistent since November 2014.  Past Medical History  Diagnosis Date  . Heart murmur   . Hx of chlamydia infection   . Abnormal Pap smear   . HSV-2 (herpes simplex virus 2) infection   . Pregnant   . Mental disorder     depression,postpartum  . Postpartum depression 04/03/2013  . Right foot drop 04/03/2013    Has - babinski, some muscle weakness  Noted when trying to curl toes  . Multiple sclerosis   . Optic neuritis     Right    Past Surgical History  Procedure Laterality Date  . Wisdom tooth extraction  2012  . Cesarean section N/A 01/27/2013    Procedure: CESAREAN SECTION;  Surgeon: Catalina Antigua, MD;  Location: [facility];  Service: Obstetrics;  Laterality: N/A;    Family History  Problem Relation Age of Onset  . Cancer Paternal Grandmother     breast  . Anxiety disorder Mother   . Heart disease Mother   . Stroke Paternal Aunt     Social history:  reports that she quit  smoking about 2 years ago. Her smoking use included Cigarettes. She smoked 0.00 packs per day. She has never used smokeless tobacco. She reports that she does not drink alcohol or use illicit drugs.    Allergies  Allergen Reactions  . Penicillins Other (See Comments)    Unknown childhood reaction    Medications:  Current Outpatient Prescriptions on File Prior to Visit  Medication Sig Dispense Refill  . oxyCODONE-acetaminophen (PERCOCET/ROXICET) 5-325 MG per tablet Take 1-2 tablets by mouth every 4 (four) hours as needed for severe pain.  21 tablet  0  . escitalopram (LEXAPRO) 10 MG tablet Take 1 tablet (10 mg total) by mouth daily.  30 tablet  6   No current facility-administered medications on file prior to visit.    ROS:  Out of a complete 14 system review of symptoms, the patient complains only of the following symptoms, and all other reviewed systems are negative.  Loss of vision, eye pain  Blood pressure 131/90, pulse 99, weight 122 lb (55.339 kg), currently breastfeeding.  Physical Exam  General: The patient is alert and cooperative at the time of the examination.  Skin: No significant peripheral edema is noted.   Neurologic Exam  Mental status: The patient is oriented x 3.  Cranial nerves: Facial symmetry is present. Speech is normal, no aphasia or dysarthria is noted. Extraocular movements are full. Visual fields are full.  Motor: The patient has  good strength in all 4 extremities.  Sensory examination:  Coordination: The patient has good finger-nose-finger and heel-to-shin bilaterally.  Gait and station: The patient has a normal gait. Tandem gait is normal. Romberg is negative. No drift is seen.  Reflexes: Deep tendon reflexes are symmetric.   Assessment/Plan:  1. Multiple sclerosis  2. 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 The patient will go on a dexamethasone taper following this. She was given a small prescription for xanax while she is on the steroids. She will follow up with Dr. Terrace ArabiaYan.  Addendum: The VER is normal. The patient is to complete the solumedrol, but if the visual symptoms continue, she is to see an opthamologist for to exclude an intraocular etiology of her symptoms.  Marlan Palau. Keith Willis MD 08/15/2013 7:57 PM  [facility] [address] [place], [place] [id number]  Phone [phone] Fax [phone]

## 2013-08-15 NOTE — Telephone Encounter (Signed)
Please call in anxiety med for this pt as you said you would.  (relating to her getting solumedrol).

## 2013-08-15 NOTE — Telephone Encounter (Signed)
Debbie with [facility] just received referral for patient but cannot take this referral--needs to discuss this--please call.

## 2013-08-15 NOTE — Patient Instructions (Signed)
HHN to administer Days 2 and 3.

## 2013-08-17 ENCOUNTER — Encounter (HOSPITAL_COMMUNITY): Payer: Self-pay | Admitting: Emergency Medicine

## 2013-08-17 ENCOUNTER — Emergency Department (HOSPITAL_COMMUNITY)
Admission: EM | Admit: 2013-08-17 | Discharge: 2013-08-17 | Disposition: A | Discharge status: Home / Self Care | Payer: Medicaid Other | Attending: Emergency Medicine | Admitting: Emergency Medicine

## 2013-08-17 DIAGNOSIS — IMO0002 Reserved for concepts with insufficient information to code with codable children: Secondary | ICD-10-CM | POA: Insufficient documentation

## 2013-08-17 DIAGNOSIS — Z880 Allergy status to penicillin: Secondary | ICD-10-CM | POA: Insufficient documentation

## 2013-08-17 DIAGNOSIS — Z87891 Personal history of nicotine dependence: Secondary | ICD-10-CM | POA: Insufficient documentation

## 2013-08-17 DIAGNOSIS — Z8619 Personal history of other infectious and parasitic diseases: Secondary | ICD-10-CM | POA: Insufficient documentation

## 2013-08-17 DIAGNOSIS — T801XXA Vascular complications following infusion, transfusion and therapeutic injection, initial encounter: Secondary | ICD-10-CM

## 2013-08-17 DIAGNOSIS — G35 Multiple sclerosis: Secondary | ICD-10-CM | POA: Insufficient documentation

## 2013-08-17 DIAGNOSIS — Z8739 Personal history of other diseases of the musculoskeletal system and connective tissue: Secondary | ICD-10-CM | POA: Insufficient documentation

## 2013-08-17 DIAGNOSIS — Z79899 Other long term (current) drug therapy: Secondary | ICD-10-CM | POA: Insufficient documentation

## 2013-08-17 DIAGNOSIS — Y838 Other surgical procedures as the cause of abnormal reaction of the patient, or of later complication, without mention of misadventure at the time of the procedure: Secondary | ICD-10-CM | POA: Insufficient documentation

## 2013-08-17 DIAGNOSIS — R011 Cardiac murmur, unspecified: Secondary | ICD-10-CM | POA: Insufficient documentation

## 2013-08-17 NOTE — ED Provider Notes (Signed)
CSN: [id number]     Arrival date & time 08/17/13  1758 History   First MD Initiated Contact with Patient 08/17/13 1835     Chief Complaint  Patient presents with  . IV occluded       HPI Pt was seen at 1840. Per pt, c/o gradual onset and persistence of constant "IV site dislodged" that began today. Pt states she had a peripheral IV saline lock placed 2 days ago for a 3 day course of home IV solumedrol rx by her Neuro MD to tx her MS. Pt states she knows she "bumped" her right arm last night "during my sleep." States when she tried to flush her IV today "it leaked and then bled all over." States she came to the ED "to get this taken out and another one put in so I can get my IV medicine for today." Denies fevers, no streaking rash.     Past Medical History  Diagnosis Date  . Heart murmur   . Hx of chlamydia infection   . Abnormal Pap smear   . HSV-2 (herpes simplex virus 2) infection   . Pregnant   . Mental disorder     depression,postpartum  . Postpartum depression 04/03/2013  . Right foot drop 04/03/2013    Has - babinski, some muscle weakness  Noted when trying to curl toes  . Multiple sclerosis   . Optic neuritis     Right   Past Surgical History  Procedure Laterality Date  . Wisdom tooth extraction  2012  . Cesarean section N/A 01/27/2013    Procedure: CESAREAN SECTION;  Surgeon: Catalina AntiguaPeggy Constant, MD;  Location: [facility];  Service: Obstetrics;  Laterality: N/A;   Family History  Problem Relation Age of Onset  . Cancer Paternal Grandmother     breast  . Anxiety disorder Mother   . Heart disease Mother   . Stroke Paternal Aunt    History  Substance Use Topics  . Smoking status: Former Smoker    Types: Cigarettes    Quit date: 12/14/2010  . Smokeless tobacco: Never Used  . Alcohol Use: No   OB History   Grav Para Term Preterm Abortions TAB SAB Ect Mult Living   2 2 1 1      2      Review of Systems ROS: Statement: All systems negative except as marked or noted in  the HPI; Constitutional: Negative for fever and chills. ; ; Eyes: Negative for eye pain, redness and discharge. ; ; ENMT: Negative for ear pain, hoarseness, nasal congestion, sinus pressure and sore throat. ; ; Cardiovascular: Negative for chest pain, palpitations, diaphoresis, dyspnea and peripheral edema. ; ; Respiratory: Negative for cough, wheezing and stridor. ; ; Gastrointestinal: Negative for nausea, vomiting, diarrhea, abdominal pain, blood in stool, hematemesis, jaundice and rectal bleeding. . ; ; Genitourinary: Negative for dysuria, flank pain and hematuria. ; ; Musculoskeletal: Negative for back pain and neck pain. Negative for swelling and trauma.; ; Skin: +bruising. Negative for pruritus, rash, abrasions, blisters, and skin lesion.; ; Neuro: Negative for headache, lightheadedness and neck stiffness. Negative for weakness, altered level of consciousness , altered mental status, extremity weakness, paresthesias, involuntary movement, seizure and syncope.      Allergies  Penicillins  Home Medications   Current Outpatient Rx  Name  Route  Sig  Dispense  Refill  . ALPRAZolam (XANAX) 0.25 MG tablet   Oral   Take 1 tablet (0.25 mg total) by mouth 2 (two) times daily as  needed for anxiety.   16 tablet   0   . methylPREDNISolone sodium succinate (SOLU-MEDROL) 500 MG injection   Intravenous   Inject 500 mg into the vein daily. 1000mg  administered over 60 minutes for 3 days starting on 08/15/2013         . escitalopram (LEXAPRO) 10 MG tablet   Oral   Take 1 tablet (10 mg total) by mouth daily.   30 tablet   6    BP 122/85  Pulse 84  Temp(Src) 98.5 F (36.9 C) (Oral)  Resp 24  Ht 5\' 1"  (1.549 m)  Wt 122 lb (55.339 kg)  BMI 23.06 kg/m2  SpO2 100% Physical Exam 1845: Physical examination:  Nursing notes reviewed; Vital signs and O2 SAT reviewed;  Constitutional: Well developed, Well nourished, Well hydrated, In no acute distress; Head:  Normocephalic, atraumatic; Eyes: EOMI,  PERRL, No scleral icterus; ENMT: Mouth and pharynx normal, Mucous membranes moist; Neck: Supple, Full range of motion, No lymphadenopathy; Cardiovascular: Regular rate and rhythm, No murmur, rub, or gallop; Respiratory: Breath sounds clear & equal bilaterally, No rales, rhonchi, wheezes.  Speaking full sentences with ease, Normal respiratory effort/excursion; Chest: Nontender, Movement normal; Abdomen: Soft, Nontender, Nondistended, Normal bowel sounds; Genitourinary: No CVA tenderness; Extremities: Pulses normal, No tenderness, +right volar forearm IV insertion site with small area of surrounding ecchymosis and blood in tubing. No erythema, no streaking up forearm, no edema.; Neuro: AA&Ox3, Major CN grossly intact.  Speech clear. No gross focal motor or sensory deficits in extremities. Climbs on and off stretcher easily by herself. Gait steady.; Skin: Color normal, Warm, Dry.   ED Course  Procedures     EKG Interpretation None      MDM  MDM Reviewed: previous chart, nursing note and vitals     2000:  Pt infused her own meds for MS, now requests IV to be removed. States this was her last dose of a 3 day course of IV steroids. States "I'm supposed to remove the IV tonight anyway" and "I'm too scared to do it on my own." ED RN will remove IV. States she wants to go home now. Treatment d/w pt and family.  Questions answered.  Verb understanding, agreeable to d/c home with outpt f/u.       Laray Anger, DO 08/19/13 2117

## 2013-08-17 NOTE — ED Notes (Signed)
Pt reports has saline lock that was started Friday.  Reports bumped it last night and has had pain and redness to site since then.  Pt c/o burning when tried to flush IV at home.  Reports has IV to receive solumedrol injections for MS.

## 2013-08-17 NOTE — ED Notes (Signed)
PT GIVING HER SELF HOME MEDICATION FOR MS. PT HAS ABOUT 10 MINS BEFORE INFUSION IS COMPLETE.

## 2013-08-17 NOTE — Discharge Instructions (Signed)
°Emergency Department Resource Guide °1) Find a Doctor and Pay Out of Pocket °Although you won't have to find out who is covered by your insurance plan, it is a good idea to ask around and get recommendations. You will then need to call the office and see if the doctor you have chosen will accept you as a new patient and what types of options they offer for patients who are self-pay. Some doctors offer discounts or will set up payment plans for their patients who do not have insurance, but you will need to ask so you aren't surprised when you get to your appointment. ° °2) Contact Your Local Health Department °Not all health departments have doctors that can see patients for sick visits, but many do, so it is worth a call to see if yours does. If you don't know where your local health department is, you can check in your phone book. The CDC also has a tool to help you locate your state's health department, and many state websites also have listings of all of their local health departments. ° °3) Find a Walk-in Clinic °If your illness is not likely to be very severe or complicated, you may want to try a walk in clinic. These are popping up all over the country in pharmacies, drugstores, and shopping centers. They're usually staffed by nurse practitioners or physician assistants that have been trained to treat common illnesses and complaints. They're usually fairly quick and inexpensive. However, if you have serious medical issues or chronic medical problems, these are probably not your best option. ° °No Primary Care Doctor: °- Call [facility] Connect at  832-8000 - they can help you locate a primary care doctor that  accepts your insurance, provides certain services, etc. °- Physician Referral Service- 1-800-533-3463 ° °Chronic Pain Problems: °Organization         Address  Phone   Notes  °[facility]  (336) 297-2271 Patients need to be referred by their primary care doctor.  ° °Medication  Assistance: °Organization         Address  Phone   Notes  °[place] [facility] [address] °[place], [place] [address] (336) 641-8030 --Must be a resident of [place] °-- Must have NO insurance coverage whatsoever (no Medicaid/ Medicare, etc.) °-- The pt. MUST have a primary care doctor that directs their care regularly and follows them in the community °  °[facility]  (866) 331-1348   °[employer]  (888) 892-1162   ° °Agencies that provide inexpensive medical care: °Organization         Address  Phone   Notes  °[facility]  (336) 832-8035   °[facility]    (336) 832-7272   °[facility] [address] °[place], [place] [address] (336) 832-4777   °[facility] [address], °[place] (336) 271-4999   °[facility]    (336) 373-0678   °[facility]    (336) 272-1050   °[facility] ° [address], [place] Phone:  (336) 832-4444, Fax:  (336) 832-4440 Hours of Operation:  9 am - 6 pm, M-F.  Also accepts Medicaid/Medicare and self-pay.  °[facility] ° [address], [place] Phone: (336) 832-3150, Fax: (336) 832-3151. Hours of Operation:  8:30 am - 5:30 pm, M-F.  Also accepts Medicaid and self-pay.  °[facility] [place] [address], [place] Phone: (336) 878-6027   °[facility] [address], [place], [place] (336)723-1848, Ext. 123 Mondays & Thursdays: 7-9 AM.  First 15 patients are seen on a first come, first serve basis. °  ° °Medicaid-accepting [place] Providers: ° °Organization         Address  Phone   Notes  °[facility] [address], [place] (336) 641-2100 Also accepts self-pay patients.  °[facility] [address], [place] ° (336) 856-9996   °[facility] [address], [place]  (336) 288-8857   °[facility] [address] [place] [address], [place] (336) 299-7000   °Veita Bland [address], [place]  ° (336) 373-1557 Only accepts [place] Access Medicaid patients after they have their name applied to their card.  ° °Self-Pay (no insurance) in [place]: ° °Organization         Address  Phone   Notes  °Sickle Cell Patients, [facility] [address], [place] (336) 832-1970   °[facility] [address], [place] (336) 832-4400   °[facility] ° [address], [place] (336) 992-4800   °[facility]/Dr. Osei-Bonsu ° [address] [place] [address], [place] or [address], [place] (336) 841-8500 Phone number for both [place] and [place] locations is the same.  °[facility] [address], [place] (336) 299-0000   °[facility] [address] [place] [address], [place] or [address] (336) 852-7530 °(336) 878-2260   °[facility] [address], [place] (336) 350-1642, phone; (336) 294-5005, fax Sees patients 1st and 3rd Saturday of every month.  Must not qualify for public or private insurance (i.e. Medicaid, Medicare, [place] Health Choice, Veterans' Benefits) • Household income should be no more than 200% of the poverty level •The clinic cannot treat you if you are pregnant or think you are pregnant • Sexually transmitted diseases are not treated at the clinic.  ° ° °Dental Care: °Organization         Address  Phone  Notes  °[place] [facility] [address], [place] (336) 641-6152 Accepts children up to age 21 who are enrolled in Medicaid or [place] Health Choice; pregnant women with a Medicaid card; and children who have applied for Medicaid or [place] Health Choice, but were declined, whose parents can pay a reduced fee at time of service.  °[place]  [facility] [place]  [address], [place] (336) 641-7733 Accepts children up to age 21 who are enrolled in Medicaid or [place] Health Choice; pregnant women with a Medicaid card; and children who have applied for Medicaid or [place] Health Choice, but were declined, whose parents can pay a reduced fee at time of service.  °[facility] ° [address], [place] (336) 641-4533 Patients are seen by appointment only. Walk-ins are not accepted. [facility] will see patients 18 years of age and older. °Monday - Tuesday (8am-5pm) °Most Wednesdays (8:30-5pm) °$30 per visit, cash only  °[facility] ° [address], [place] (336) 641-4533 Patients are seen by appointment only. Walk-ins are not accepted. [facility] will see patients 18 years of age and older. °One   Wednesday Evening (Monthly: Volunteer Based).  $30 per visit, cash only  °[facility]  (919) 537-3737 for adults; Children under age 4, call Graduate Pediatric Dentistry at (919) [phone]. Children aged 4-14, please call (919) [phone] to request a pediatric application. ° Dental services are provided in all areas of dental care including fillings, crowns and bridges, complete and partial dentures, implants, gum treatment, root canals, and extractions. Preventive care is also provided. Treatment is provided to both adults and children. °Patients are selected via a lottery and there is often a waiting list. °  °[facility] [address], °[place] ° (336) 763-8833 [email or web address] °  °[facility] [address], [place], [place] (336)723-1848, Ext. 123 Second and Fourth Thursday of each month, opens at 6:30 AM; Clinic ends at 9 AM.  Patients are seen on a first-come first-served basis, and a limited number are seen during each clinic.  ° °[facility] ° [address], [place], [place] (336) 723-7904    Eligibility Requirements °You must have lived in [place], or [place] counties for at least the last three months. °  You cannot be eligible for state or federal sponsored healthcare insurance, including Veterans Administration, Medicaid, or Medicare. °  You generally cannot be eligible for healthcare insurance through your employer.  °  How to apply: °Eligibility screenings are held every Tuesday and Wednesday afternoon from 1:00 pm until 4:00 pm. You do not need an appointment for the interview!  °[facility] [address], [place], [place] 336-631-2330   °[facility]  336-342-8273   °[facility]  336-703-3100   °[facility]  336-570-6415   ° °Behavioral Health Resources in the Community: °Intensive Outpatient Programs °Organization         Address  Phone  Notes  °[facility] [address], [place], [place] 336-878-6098   °[facility] [address], [place], [place] 336-832-9800   °[facility] [address], [place], [place] ° 336-882-2125   °[facility] [address],  °[place], [place] 1-800-853-5163 or 336-641-4981   °Substance Abuse Resources °Organization         Address  Phone  Notes  °[facility]  336-882-2125   °[facility]  336-784-9470   °[facility]  336-285-9073   °[facility]  336-845-3988   °Residential & Outpatient Substance Abuse Program  1-800-659-3381   °Psychological Services °Organization         Address  Phone  Notes  °[facility]  336- 832-9600   °[facility]  336- 378-7881   °[facility] [address], [place] 1-800-853-5163 or 336-641-4981   ° °Mobile Crisis Teams °Organization         Address  Phone  Notes  °[facility], Mobile Crisis Care Unit  1-877-626-1772   °Assertive °[facility] ° [address]  [place], [place] 336-834-9664   °Sharon DeEsch [address] °[place] 336-554-5454   ° °Self-Help/Support Groups °Organization         Address  Phone             Notes  °[facility]  - variety of support groups  336- 373-1402 Call for more information  °Narcotics Anonymous (NA), [facility] [address], °[place]  2 meetings at this location  ° °  Residential Treatment Programs Organization         Address  Phone  Notes  [facility] [address],    [place]  7-121-975-8832   [facility]  [address], [place] [address], [place], [place] 415-830-9407   [facility] [address] [place], [place] 680-881-1031 Admissions: 8am-3pm M-F  [facility] [address],    [place], [place] 594-585-9292   [facility] [address] [place], [place], [place] 446-286-3817   [facility] [address],  [place], [place] 711-657-9038   [facility] Intensive Outpatient [address], [place], [place] 333-832-9191   [facility] ([facility] [address] [place]  [place], [place] 6-606-004-5997 or (301) 495-9547   [facility] ([facility] [address] [place], [place] 023-343-5686 Accepts Medicaid  [facility] [place] [address],  [place] 1-683-729-0211 Substance Abuse/Addiction Treatment   [facility] Organization         Address  Phone  Notes  [facility]  210-740-0139   Angie Fava, PhD [address] [place] [address] [place], [place]   860-798-7340 or 571 238 0687   [facility]   [address] [place], [place] 775-156-5804   [facility] [address] [place], [place] 763-362-8424 Insurance/Medicaid/sponsorship through [facility] [address]                                    [place], [place] 815-029-4130 Therapy/tele-psych/case    [facility] [address], [place] (671)826-1347    Dr. Lolly Mustache  6622347374   [facility]  [employer] [facility] Dept. 1) [address], [place] 2) [address], [place] 3)  [address], [place] [phone] 6507214842  (724)600-8669   [facility] Child Abuse Hotline 386-071-3486 or 434-882-4001 (After Hours)       Take your usual prescriptions as previously directed. Apply moist heat to the area(s) of discomfort, for 15 minutes at a time, several times per day for the next few days.  Do not fall asleep on a heating pack.  Call your regular medical doctor on Monday to schedule a follow up appointment in the next 2 days.  Return to the Emergency Department immediately if worsening.

## 2013-08-20 ENCOUNTER — Telehealth: Payer: Self-pay | Admitting: *Deleted

## 2013-08-20 ENCOUNTER — Encounter: Payer: Self-pay | Admitting: *Deleted

## 2013-08-20 NOTE — Telephone Encounter (Signed)
Needing a return to work note. Fax to Deanne CofferJohn [name] [phone] Care taker Lucrezia StarchWilliam Cross

## 2013-08-20 NOTE — Telephone Encounter (Signed)
Redid letter and gave to Will.  He stated she is better, still has some funny feeling in her head, appetite up and down.  ? Oral prednisone.  I see that this was cancelled as change in therapy.

## 2013-08-21 NOTE — Telephone Encounter (Signed)
I called patient. The patient is still having some pain behind the right eye, indicates that the vision is okay. The patient got better with the Solu-Medrol, but the pain is back. I asked her to see an ophthalmologist, but she indicates that her Medicaid would not pay for that. The patient will go on the dexamethasone taper that was previously ordered.

## 2013-08-21 NOTE — Telephone Encounter (Addendum)
I called pt and she stated that the steroids for the 3 days helped her sx but she is now noticing pain in her R eye.  She cannot go to opthamologist because of her medicaid not paying.  She still has brain fog, wiggly feeling in her head.  Call her home #.

## 2013-08-25 ENCOUNTER — Telehealth: Payer: Self-pay | Admitting: *Deleted

## 2013-08-25 NOTE — Telephone Encounter (Signed)
Pt called back requesting Domperidone. Thanks!!! [place] [employer]

## 2013-08-25 NOTE — Telephone Encounter (Signed)
Spoke with pt. Pt states her milk is drying up. Has tried Mother's Milk, Fenugreek, drinks lots of water and eats well balanced meals, due to MS. Pt requests something to help increase her milk supply. Please advise. [facility] [place]. Pt was advised to check with drug store later today. Thanks!! JSY

## 2013-08-25 NOTE — Telephone Encounter (Signed)
I called pt am not familiar with domperidone, so will not order it for her, esp with her MS.

## 2013-09-12 ENCOUNTER — Telehealth: Payer: Self-pay | Admitting: Neurology

## 2013-09-12 NOTE — Telephone Encounter (Signed)
I called pt and she awoke today at 1400.  C/o of below stated sx, since 1700 stated to go to ED or urgent care for evaluation.  She is not on any MS therapy.  Vision ok.  May need steroids. She stated she would.

## 2013-09-12 NOTE — Telephone Encounter (Signed)
Patient is having trouble walking--is losing feeling in both legs with the right one worse since this morning-rt arm is feeling a little tingly-wants to be seen today--please call.

## 2013-09-22 ENCOUNTER — Ambulatory Visit (INDEPENDENT_AMBULATORY_CARE_PROVIDER_SITE_OTHER): Payer: Medicaid Other | Admitting: Adult Health

## 2013-09-22 ENCOUNTER — Encounter: Payer: Self-pay | Admitting: Adult Health

## 2013-09-22 VITALS — BP 110/70 | Ht 61.0 in | Wt 129.0 lb

## 2013-09-22 DIAGNOSIS — N390 Urinary tract infection, site not specified: Secondary | ICD-10-CM

## 2013-09-22 DIAGNOSIS — R309 Painful micturition, unspecified: Secondary | ICD-10-CM

## 2013-09-22 HISTORY — DX: Urinary tract infection, site not specified: N39.0

## 2013-09-22 LAB — POCT URINALYSIS DIPSTICK
Blood, UA: NEGATIVE
Glucose, UA: NEGATIVE
Ketones, UA: NEGATIVE

## 2013-09-22 MED ORDER — PHENAZOPYRIDINE HCL 200 MG PO TABS: 200.0000 mg | ORAL_TABLET | Freq: Three times a day (TID) | ORAL | Status: DC | PRN

## 2013-09-22 MED ORDER — NITROFURANTOIN MONOHYD MACRO 100 MG PO CAPS: 100.0000 mg | ORAL_CAPSULE | Freq: Two times a day (BID) | ORAL | Status: DC

## 2013-09-22 NOTE — Patient Instructions (Signed)
Urinary Tract Infection Urinary tract infections (UTIs) can develop anywhere along your urinary tract. Your urinary tract is your body's drainage system for removing wastes and extra water. Your urinary tract includes two kidneys, two ureters, a bladder, and a urethra. Your kidneys are a pair of bean-shaped organs. Each kidney is about the size of your fist. They are located below your ribs, one on each side of your spine. CAUSES Infections are caused by microbes, which are microscopic organisms, including fungi, viruses, and bacteria. These organisms are so small that they can only be seen through a microscope. Bacteria are the microbes that most commonly cause UTIs. SYMPTOMS  Symptoms of UTIs may vary by age and gender of the patient and by the location of the infection. Symptoms in young women typically include a frequent and intense urge to urinate and a painful, burning feeling in the bladder or urethra during urination. Older women and men are more likely to be tired, shaky, and weak and have muscle aches and abdominal pain. A fever may mean the infection is in your kidneys. Other symptoms of a kidney infection include pain in your back or sides below the ribs, nausea, and vomiting. DIAGNOSIS To diagnose a UTI, your caregiver will ask you about your symptoms. Your caregiver also will ask to provide a urine sample. The urine sample will be tested for bacteria and white blood cells. White blood cells are made by your body to help fight infection. TREATMENT  Typically, UTIs can be treated with medication. Because most UTIs are caused by a bacterial infection, they usually can be treated with the use of antibiotics. The choice of antibiotic and length of treatment depend on your symptoms and the type of bacteria causing your infection. HOME CARE INSTRUCTIONS  If you were prescribed antibiotics, take them exactly as your caregiver instructs you. Finish the medication even if you feel better after you  have only taken some of the medication.  Drink enough water and fluids to keep your urine clear or pale yellow.  Avoid caffeine, tea, and carbonated beverages. They tend to irritate your bladder.  Empty your bladder often. Avoid holding urine for long periods of time.  Empty your bladder before and after sexual intercourse.  After a bowel movement, women should cleanse from front to back. Use each tissue only once. SEEK MEDICAL CARE IF:   You have back pain.  You develop a fever.  Your symptoms do not begin to resolve within 3 days. SEEK IMMEDIATE MEDICAL CARE IF:   You have severe back pain or lower abdominal pain.  You develop chills.  You have nausea or vomiting.  You have continued burning or discomfort with urination. MAKE SURE YOU:   Understand these instructions.  Will watch your condition.  Will get help right away if you are not doing well or get worse. Document Released: 02/08/2005 Document Revised: 10/31/2011 Document Reviewed: 06/09/2011 [facility] Patient Information 2014 [place], [place]. Take macrobid and pyridium and push fluids

## 2013-09-22 NOTE — Progress Notes (Signed)
Subjective:     Patient ID: Haley HartiganHeidi L [name], female   DOB: 1991-05-14, 23 y.o.   MRN: [id number]  HPI Haley BucklerHeidi is a 23 year old white female in complaining of pain "liking peeing razor blades" x 2 days, had some symptoms 2 weeks ago then got better.She is breast feeding but will pump and dump during treatment.  Review of Systems See HPI Reviewed past medical,surgical, social and family history. Reviewed medications and allergies.     Objective:   Physical Exam BP 110/70  Ht 5\' 1"  (1.549 m)  Wt 129 lb (58.514 kg)  BMI 24.39 kg/m2  LMP 09/13/2013  Breastfeeding? YesURINE orange due to AZO, no CVAT but is tender over bladder    Assessment:     Painful urination UTI     Plan:     Rx macrobid 1 bid x 7 days Rx pyridium 200 mg #10 1 tid Push fluids UA C&S Review handout on UTI

## 2013-09-23 LAB — URINALYSIS
Glucose, UA: NEGATIVE mg/dL
Hgb urine dipstick: NEGATIVE
Ketones, ur: 15 mg/dL — AB
NITRITE: POSITIVE — AB
Protein, ur: 30 mg/dL — AB
SPECIFIC GRAVITY, URINE: 1.023 (ref 1.005–1.030)
Urobilinogen, UA: 4 mg/dL — ABNORMAL HIGH (ref 0.0–1.0)
pH: 7 (ref 5.0–8.0)

## 2013-09-25 ENCOUNTER — Telehealth: Payer: Self-pay | Admitting: Adult Health

## 2013-09-25 LAB — URINE CULTURE

## 2013-09-25 NOTE — Telephone Encounter (Signed)
Pt aware meds should cover UTI

## 2013-10-05 ENCOUNTER — Encounter (HOSPITAL_COMMUNITY): Payer: Self-pay | Admitting: Emergency Medicine

## 2013-10-05 ENCOUNTER — Emergency Department (HOSPITAL_COMMUNITY)
Admission: EM | Admit: 2013-10-05 | Discharge: 2013-10-05 | Disposition: A | Discharge status: Home / Self Care | Payer: Medicaid Other | Attending: Emergency Medicine | Admitting: Emergency Medicine

## 2013-10-05 DIAGNOSIS — R011 Cardiac murmur, unspecified: Secondary | ICD-10-CM | POA: Insufficient documentation

## 2013-10-05 DIAGNOSIS — F3289 Other specified depressive episodes: Secondary | ICD-10-CM | POA: Insufficient documentation

## 2013-10-05 DIAGNOSIS — Z79899 Other long term (current) drug therapy: Secondary | ICD-10-CM | POA: Insufficient documentation

## 2013-10-05 DIAGNOSIS — Z87440 Personal history of urinary (tract) infections: Secondary | ICD-10-CM | POA: Insufficient documentation

## 2013-10-05 DIAGNOSIS — Z8619 Personal history of other infectious and parasitic diseases: Secondary | ICD-10-CM | POA: Insufficient documentation

## 2013-10-05 DIAGNOSIS — Z8739 Personal history of other diseases of the musculoskeletal system and connective tissue: Secondary | ICD-10-CM | POA: Insufficient documentation

## 2013-10-05 DIAGNOSIS — F329 Major depressive disorder, single episode, unspecified: Secondary | ICD-10-CM | POA: Insufficient documentation

## 2013-10-05 DIAGNOSIS — B354 Tinea corporis: Secondary | ICD-10-CM | POA: Insufficient documentation

## 2013-10-05 DIAGNOSIS — Z87891 Personal history of nicotine dependence: Secondary | ICD-10-CM | POA: Insufficient documentation

## 2013-10-05 DIAGNOSIS — Z8669 Personal history of other diseases of the nervous system and sense organs: Secondary | ICD-10-CM | POA: Insufficient documentation

## 2013-10-05 DIAGNOSIS — Z880 Allergy status to penicillin: Secondary | ICD-10-CM | POA: Insufficient documentation

## 2013-10-05 MED ORDER — CLOTRIMAZOLE 1 % EX CREA: TOPICAL_CREAM | CUTANEOUS | Status: DC

## 2013-10-05 NOTE — ED Notes (Addendum)
Small lesion noted yesterday on right side of torso, just under breast. Also has MS and over past week has been having intermittent numbness on right torso

## 2013-10-05 NOTE — ED Provider Notes (Signed)
CSN: [id number]     Arrival date & time 10/05/13  1904 History  This chart was scribed for Toy Baker, MD by Quintella Reichert, ED scribe.  This patient was seen in room APA04/APA04 and the patient's care was started at 8:24 PM.   Chief Complaint  Patient presents with  . Rash    The history is provided by the patient. No language interpreter was used.    HPI Comments: Haley [name] is a 23 y.o. female who presents to the Emergency Department complaining of a rash on the right side of her torso that she noticed yesterday.  Pt describes the rash as a small lesion just under her right breast.  She states she looked up ringworm on Google and she feels this may be the cause of her rash.  Her husband has a similar rash.  She denies rash to any other area.   Past Medical History  Diagnosis Date  . Heart murmur   . Hx of chlamydia infection   . Abnormal Pap smear   . HSV-2 (herpes simplex virus 2) infection   . Pregnant   . Mental disorder     depression,postpartum  . Postpartum depression 04/03/2013  . Right foot drop 04/03/2013    Has - babinski, some muscle weakness  Noted when trying to curl toes  . Multiple sclerosis   . Optic neuritis     Right  . UTI (lower urinary tract infection) 09/22/2013    Past Surgical History  Procedure Laterality Date  . Wisdom tooth extraction  2012  . Cesarean section N/A 01/27/2013    Procedure: CESAREAN SECTION;  Surgeon: Catalina Antigua, MD;  Location: [facility];  Service: Obstetrics;  Laterality: N/A;    Family History  Problem Relation Age of Onset  . Cancer Paternal Grandmother     breast  . Anxiety disorder Mother   . Heart disease Mother   . Stroke Paternal Aunt     History  Substance Use Topics  . Smoking status: Former Smoker    Types: Cigarettes    Quit date: 12/14/2010  . Smokeless tobacco: Never Used  . Alcohol Use: No    OB History   Grav Para Term Preterm Abortions TAB SAB Ect Mult Living   2 2 1 1      2         Review of Systems  Skin: Positive for rash.  All other systems reviewed and are negative.     Allergies  Penicillins  Home Medications   Prior to Admission medications   Medication Sig Start Date End Date Taking? Authorizing Provider  escitalopram (LEXAPRO) 10 MG tablet Take 10 mg by mouth daily.   Yes Historical Provider, MD   BP 125/61  Pulse 96  Temp(Src) 98.1 F (36.7 C) (Oral)  Resp 18  Ht 5\' 1"  (1.549 m)  Wt 126 lb (57.153 kg)  BMI 23.82 kg/m2  SpO2 99%  LMP 09/13/2013  Physical Exam  Nursing note and vitals reviewed. Constitutional: She is oriented to person, place, and time. She appears well-developed and well-nourished.  Non-toxic appearance. No distress.  HENT:  Head: Normocephalic and atraumatic.  Eyes: Conjunctivae, EOM and lids are normal. Pupils are equal, round, and reactive to light.  Neck: Normal range of motion. Neck supple. No tracheal deviation present. No mass present.  Cardiovascular: Normal rate, regular rhythm and normal heart sounds.  Exam reveals no gallop.   No murmur heard. Pulmonary/Chest: Effort normal and breath sounds normal.  No stridor. No respiratory distress. She has no decreased breath sounds. She has no wheezes. She has no rhonchi. She has no rales.  Abdominal: Soft. Normal appearance and bowel sounds are normal. She exhibits no distension. There is no tenderness. There is no rebound and no CVA tenderness.  Musculoskeletal: Normal range of motion. She exhibits no edema and no tenderness.  Neurological: She is alert and oriented to person, place, and time. She has normal strength. No cranial nerve deficit or sensory deficit. GCS eye subscore is 4. GCS verbal subscore is 5. GCS motor subscore is 6.  Skin: Skin is warm and dry. No abrasion and no rash noted.  Approximately 1.5-cm circular area on right mid-anterior abdomen, with a raised border, central clearing, and scaling along the rash area.  Psychiatric: She has a normal mood  and affect. Her speech is normal and behavior is normal.    ED Course  Procedures (including critical care time)  DIAGNOSTIC STUDIES: Oxygen Saturation is 99% on room air, normal by my interpretation.    COORDINATION OF CARE: 8:41 PM-Discussed treatment plan which includes topical ointment with pt at bedside and pt agreed to plan.     Labs Review Labs Reviewed - No data to display  Imaging Review No results found.   EKG Interpretation None      MDM   Final diagnoses:  Ringworm of body    I personally performed the services described in this documentation, which was scribed in my presence. The recorded information has been reviewed and is accurate.  Pt tx with lotrimin for ringworm  Toy BakerAnthony T Dajuana Palen, MD 10/08/13 [phone]

## 2013-10-05 NOTE — Discharge Instructions (Signed)
Body Ringworm °Ringworm (tinea corporis) is a fungal infection of the skin on the body. This infection is not caused by worms, but is actually caused by a fungus. Fungus normally lives on the top of your skin and can be useful. However, in the case of ringworms, the fungus grows out of control and causes a skin infection. It can involve any area of skin on the body and can spread easily from one person to another (contagious). Ringworm is a common problem for children, but it can affect adults as well. Ringworm is also often found in athletes, especially wrestlers who share equipment and mats.  °CAUSES  °Ringworm of the body is caused by a fungus called dermatophyte. It can spread by: °· Touching other people who are infected. °· Touching infected pets. °· Touching or sharing objects that have been in contact with the infected person or pet (hats, combs, towels, clothing, sports equipment). °SYMPTOMS  °· Itchy, raised red spots and bumps on the skin. °· Ring-shaped rash. °· Redness near the border of the rash with a clear center. °· Dry and scaly skin on or around the rash. °Not every person develops a ring-shaped rash. Some develop only the red, scaly patches. °DIAGNOSIS  °Most often, ringworm can be diagnosed by performing a skin exam. Your caregiver may choose to take a skin scraping from the affected area. The sample will be examined under the microscope to see if the fungus is present.  °TREATMENT  °Body ringworm may be treated with a topical antifungal cream or ointment. Sometimes, an antifungal shampoo that can be used on your body is prescribed. You may be prescribed antifungal medicines to take by mouth if your ringworm is severe, keeps coming back, or lasts a long time.  °HOME CARE INSTRUCTIONS  °· Only take over-the-counter or prescription medicines as directed by your caregiver. °· Wash the infected area and dry it completely before applying your cream or ointment. °· When using antifungal shampoo to  treat the ringworm, leave the shampoo on the body for 3 5 minutes before rinsing.    °· Wear loose clothing to stop clothes from rubbing and irritating the rash. °· Wash or change your bed sheets every night while you have the rash. °· Have your pet treated by your veterinarian if it has the same infection. °To prevent ringworm:  °· Practice good hygiene. °· Wear sandals or shoes in public places and showers. °· Do not share personal items with others. °· Avoid touching red patches of skin on other people. °· Avoid touching pets that have bald spots or wash your hands after doing so. °SEEK MEDICAL CARE IF:  °· Your rash continues to spread after 7 days of treatment. °· Your rash is not gone in 4 weeks. °· The area around your rash becomes red, warm, tender, and swollen. °Document Released: 04/28/2000 Document Revised: 01/24/2012 Document Reviewed: 11/13/2011 °ExitCare® Patient Information ©2014 [employer]. ° °

## 2013-10-10 ENCOUNTER — Telehealth: Payer: Self-pay | Admitting: Adult Health

## 2013-10-10 NOTE — Telephone Encounter (Signed)
Noble had questions about urine on my chart, questions answered

## 2013-10-16 ENCOUNTER — Ambulatory Visit (INDEPENDENT_AMBULATORY_CARE_PROVIDER_SITE_OTHER): Payer: Medicaid Other | Admitting: Neurology

## 2013-10-16 ENCOUNTER — Encounter: Payer: Self-pay | Admitting: Neurology

## 2013-10-16 VITALS — BP 120/77 | HR 90 | Ht 61.0 in | Wt 127.0 lb

## 2013-10-16 DIAGNOSIS — G35 Multiple sclerosis: Secondary | ICD-10-CM

## 2013-10-16 DIAGNOSIS — H469 Unspecified optic neuritis: Secondary | ICD-10-CM

## 2013-10-16 LAB — COMPREHENSIVE METABOLIC PANEL
ALBUMIN: 4.5 g/dL (ref 3.5–5.5)
ALT: 12 IU/L (ref 0–32)
AST: 15 IU/L (ref 0–40)
Albumin/Globulin Ratio: 1.8 (ref 1.1–2.5)
Alkaline Phosphatase: 73 IU/L (ref 39–117)
BUN/Creatinine Ratio: 25 — ABNORMAL HIGH (ref 8–20)
BUN: 14 mg/dL (ref 6–20)
CALCIUM: 9.4 mg/dL (ref 8.7–10.2)
CHLORIDE: 102 mmol/L (ref 96–108)
CO2: 25 mmol/L (ref 18–29)
Creatinine, Ser: 0.57 mg/dL (ref 0.57–1.00)
GFR calc Af Amer: 150 mL/min/{1.73_m2} (ref >59)
GFR calc non Af Amer: 130 mL/min/{1.73_m2} (ref >59)
Globulin, Total: 2.5 g/dL (ref 1.5–4.5)
Glucose: 96 mg/dL (ref 65–99)
POTASSIUM: 4.4 mmol/L (ref 3.5–5.2)
Sodium: 139 mmol/L (ref 134–144)
Total Bilirubin: 0.2 mg/dL (ref 0.0–1.2)
Total Protein: 7 g/dL (ref 6.0–8.5)

## 2013-10-16 LAB — CBC WITH DIFFERENTIAL
BASOS ABS: 0 10*3/uL (ref 0.0–0.2)
Basos: 0 %
EOS: 1 %
Eosinophils Absolute: 0.1 10*3/uL (ref 0.0–0.4)
HCT: 36.3 % (ref 34.0–46.6)
Hemoglobin: 12.5 g/dL (ref 11.1–15.9)
LYMPHS ABS: 2.6 10*3/uL (ref 0.7–3.1)
Lymphs: 26 %
MCH: 28.8 pg (ref 26.6–33.0)
MCHC: 34.4 g/dL (ref 31.5–35.7)
MCV: 84 fL (ref 79–97)
MONOCYTES: 7 %
MONOS ABS: 0.7 10*3/uL (ref 0.1–0.9)
Neutrophils Absolute: 6.5 10*3/uL (ref 1.4–7.0)
Neutrophils Relative %: 66 %
PLATELETS: 290 10*3/uL (ref 150–379)
RBC: 4.34 x10E6/uL (ref 3.77–5.28)
RDW: 13.7 % (ref 12.3–15.4)
WBC: 9.9 10*3/uL (ref 3.4–10.8)

## 2013-10-16 NOTE — Progress Notes (Addendum)
[facility]  PATIENT: [name] DOB: April 03, 1991  HISTORICAL ( initial visit Dec 2014)  Haley [name] is a 23 years old right-handed Caucasian female, referred by her obstetrician for evaluation of abnormal MRI of brain, consistent with multiple sclerosis  She was previously healthy, Uneventful pregnancy 6 years ago, She has a normal healthy child at 69 years old, during her most recent pregnancy, at [redacted] weeks pregnant, she suddenly developed lower abdominal pain, presented to the emergency room, was found to have elevated blood pressure 250 over 130, acute liver failure, elevated AST 1200, she had emergency C-section, her daughter was born at 66 weeks 3 days, she was diagnosed with eclampsia, her blood pressure, and laboratory evaluation has much improved afterwards.   During her postpartum followup in April 03 2013, she reported a week history of right foot drop, she was referred for MRI of brain, which demonstrated numerous intracranial lesions many which demonstrate enhancement involving supratentorial and infratentorial region. Index enhancing  lesion within the posterior medial left frontal lobe measures 2 x 1.2 x 1.1 cm with marked surrounding vasogenic edema. White matter lesions extend into the corpus callosum.  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 This is located centrally and ventrally in the midline. No  enhancement of this lesion. No other cervical cord lesions are identified. MRI thoracic spine showed 1 cm nonenhancing cord lesion at T2-3. 6 mm slightly enhancing cord lesion at T12  Laboratory showed normal and active HIV, RPR, ANA, ESR, CSF showed total protein 35, pulse 58, negative VDRL, more than 5 oligoclonal banding, WBC 13, RBC 3,  She complains of difficulty focusing, anxiety attacks, a week  history of bilateral lower extremity deep achy pain, she denies visual loss, denies gait difficulty  JC virus antibody (05/03/2013) was negative with titer of 0.16,  Repeat test June 5th 2015, was negative, titer 0.15  UPDATE June 4th 2015: She was evaluated by my colleague Dr. Jannifer Franklin, right optic neuritis, right eye pain, right-sided blurry vision,, in April 3rd 2015, visual evoked potential was normal, she was treated with iv Solu-Medrol for 3 days, which did help her visual symptoms,  Since May 15th 2015, she noticed right low thoracic numbness, mild gait difficulty, she has frequent UTIs, not empty bladder completely.  She stays home for her children of 77 years old, 9 months.   REVIEW OF SYSTEMS:  Swelling abdomen, insomnia, numbness, achy muscles, agitations, nervousness, anxious,  ALLERGIES: Allergies  Allergen Reactions  . Penicillins Other (See Comments)    Unknown childhood reaction    HOME MEDICATIONS: Outpatient Prescriptions Prior to Visit  Medication Sig Dispense Refill  . ALPRAZolam (XANAX) 0.25 MG tablet Take 1 tablet (0.25 mg total) by mouth 2 (two) times daily as needed for anxiety.  16 tablet  0  . escitalopram (LEXAPRO) 10 MG tablet Take 1 tablet (10 mg total) by mouth daily.  30 tablet  6  . oxyCODONE-acetaminophen (PERCOCET/ROXICET) 5-325 MG per tablet Take 1-2 tablets by mouth every 4 (four) hours as needed for severe pain.  21 tablet  0     PAST MEDICAL HISTORY: Past Medical History  Diagnosis Date  . Heart murmur   . Hx of chlamydia infection   . Abnormal Pap smear   . HSV-2 (herpes simplex virus 2) infection   . Mental disorder   .  Postpartum depression 04/03/2013  . Right foot drop 04/03/2013  . Multiple sclerosis     PAST SURGICAL HISTORY: Past Surgical History  Procedure Laterality Date  . Wisdom tooth extraction  2012  . Cesarean section N/A 01/27/2013    Procedure: CESAREAN SECTION;  Surgeon: Mora Bellman, MD;  Location: [facility];  Service:  Obstetrics;  Laterality: N/A;    FAMILY HISTORY: Family History  Problem Relation Age of Onset  . Cancer Paternal Grandmother     breast  . Anxiety disorder Mother   . Heart disease Mother   . Stroke Paternal Aunt     SOCIAL HISTORY:  History   Social History  . Marital Status: Single    Spouse Name: N/A    Number of Children: 2  . Years of Education: GED+   Occupational History    unemployed    Electronics engineer   Social History Main Topics  . Smoking status: Former Smoker    Types: Cigarettes    Quit date: 12/14/2010  . Smokeless tobacco: Never Used  . Alcohol Use: No  . Drug Use: No  . Sexual Activity: Not Currently    Birth Control/ Protection: None   Other Topics Concern  . Not on file   Social History Narrative   Pt lives home with finance (Will Bee) and two children   Pt is right handed   Education GED, but is currently a part time student   Pt may consume one cup of caffeine on a daily basis     PHYSICAL EXAM   Filed Vitals:   10/16/13 1317  BP: 120/77  Pulse: 90  Height: 5' 1" (1.549 m)  Weight: 127 lb (57.607 kg)    Not recorded    Body mass index is 24.01 kg/(m^2).   Generalized: In no acute distress  Neck: Supple, no carotid bruits   Cardiac: Regular rate rhythm  Pulmonary: Clear to auscultation bilaterally  Musculoskeletal: No deformity  Neurological examination  Mentation: Alert oriented to time, place, history taking, and causual conversation  Cranial nerve II-XII: Pupils were equal round reactive to light extraocular movements were full, Visual field were full on confrontational test. Bilateral fundi were sharp. Smooth pursuit eye movement was broken up into small catchup saccade, Facial sensation and strength were normal. Hearing was intact to finger rubbing bilaterally. Uvula tongue midline.  head turning and shoulder shrug and were normal and symmetric.Tongue protrusion into cheek strength was normal.  Motor: normal  tone, bulk and strength, with exception of mild bilateral lower extremity spasticity  Sensory: Decreased fine touch, pinprick at right thoracic region, from T2-T12,  preserved vibratory sensation, and proprioception at toes.  Coordination: Normal finger to nose, heel-to-shin bilaterally there was no truncal ataxia  Gait: Rising up from seated position without assistance, mild unsteady, stiff gait, mild difficulty with tandem, tiptoe walking, heel walking,  Romberg signs: Negative  Deep tendon reflexes: Brachioradialis 2/2, biceps 2/2, triceps 2/2, patellar 3/3, Achilles 2/2, plantar responses were flexor bilaterally.   DIAGNOSTIC DATA (LABS, IMAGING, TESTING) - I reviewed patient records, labs, notes, testing and imaging myself where available.  Lab Results  Component Value Date   WBC 9.2 04/04/2013   HGB 12.5 04/04/2013   HCT 39.0 04/04/2013   MCV 86.7 04/04/2013   PLT 270 04/04/2013      Component Value Date/Time   NA 141 04/04/2013 0604   K 4.0 04/06/2013 0915   CL 104 04/04/2013 0604   CO2 25 04/04/2013 0604   GLUCOSE  104* 04/04/2013 0604   BUN 13 04/04/2013 0604   CREATININE 0.59 04/04/2013 0604   CREATININE 0.62 04/03/2013 1145   CALCIUM 10.1 04/04/2013 0604   PROT 7.3 05/02/2013 1459   PROT 6.7 04/04/2013 0604   ALBUMIN 3.4* 04/04/2013 0604   AST 13 04/04/2013 0604   ALT 9 04/04/2013 0604   ALKPHOS 92 04/04/2013 0604   BILITOT 0.2* 04/04/2013 0604   GFRNONAA >90 04/04/2013 0604   GFRAA >90 04/04/2013 0604   No results found for this basename: CHOL,  HDL,  LDLCALC,  LDLDIRECT,  TRIG,  CHOLHDL   No results found for this basename: HGBA1C   Lab Results  Component Value Date   VITAMINB12 603 04/04/2013   Lab Results  Component Value Date   TSH 1.620 04/03/2013      ASSESSMENT AND PLAN   23 years old female, postpartum, with relapsing remitting multiple sclerosis. Her MRIs were markedly abnormal, enhancing lesions involving brain, cervical, thoracic  spine, spinal fluid showed more than 5 oligoclonal banding, she has JC virus antibody (05/03/2013) was positive with titer of 0.16, she is no longer breast-feeding, initial presentation was right foot drop November 2014,  flareup since April 2015, right optic neuritis, Sep 26 2013, right thoracic area numbness.  1.   I have reviewed the film and explained to her the diagnosis of multiple sclerosis  2. Proceed with Dwyane Dee, she has low titer Jc-virus antibody. 3. Lab today. 4. Repeat MRI of neuroaxis  5. Jc-virus antibody every 3 months, MRI brain w/wo q6 months. 6. RTC in 2 months   Marcial Pacas, M.D. Ph.D.  [facility] [address] [place], [place] [address] [phone] and

## 2013-10-17 NOTE — Progress Notes (Signed)
Quick Note:  I called and gave her the normal results. JCV pending. Pt verbalized understanding. ______

## 2013-10-27 ENCOUNTER — Telehealth: Payer: Self-pay | Admitting: Neurology

## 2013-10-27 DIAGNOSIS — G35 Multiple sclerosis: Secondary | ICD-10-CM

## 2013-10-27 NOTE — Telephone Encounter (Signed)
Diamond Nickel with [employer] @ [phone], calling to inform Dr. Terrace Arabia patient will not start Tysabri.  Patient feels risk outweigh benefits.

## 2013-10-28 NOTE — Telephone Encounter (Signed)
I have called her significant other, she should come back for repeat  JC virus antibody, and also complete MRI brain, cervical, thoracic spine as planned, we will discuss further treatment options, including potential Tyarbri infusion,

## 2013-10-28 NOTE — Telephone Encounter (Signed)
Dr. Terrace Arabia tried to reach patient but could not leave a message.  Patient informed [employer] that she did not want to start Tysabri.

## 2013-10-29 ENCOUNTER — Telehealth: Payer: Self-pay | Admitting: Neurology

## 2013-10-29 ENCOUNTER — Ambulatory Visit
Admission: RE | Admit: 2013-10-29 | Discharge: 2013-10-29 | Disposition: A | Discharge status: Home / Self Care | Payer: Medicaid Other | Source: Ambulatory Visit | Attending: Neurology | Admitting: Neurology

## 2013-10-29 DIAGNOSIS — H469 Unspecified optic neuritis: Secondary | ICD-10-CM

## 2013-10-29 DIAGNOSIS — G35 Multiple sclerosis: Secondary | ICD-10-CM

## 2013-10-29 MED ORDER — GADOBENATE DIMEGLUMINE 529 MG/ML IV SOLN
12.0000 mL | Freq: Once | INTRAVENOUS | Status: AC | PRN
  Administered 2013-10-29: 12 mL via INTRAVENOUS

## 2013-10-29 NOTE — Telephone Encounter (Signed)
Patient called states she was to come by today to see Dr Terrace Arabia no appointment to be scheduled to discuss MRI results. She is not able to come because of family emergency. Also she was not able to complete all 3 scans, only did brain & neck. She will call to reschedule spine. Please call

## 2013-10-29 NOTE — Telephone Encounter (Signed)
[name], Please call patient, Haley [name] was negative, in June 5th 2015. She should continue follow up appt after MRI

## 2013-10-29 NOTE — Telephone Encounter (Signed)
I believe the patient misunderstood about seeing the doctor.  She was to have all her MRI's and when results are final we will call and schedule her follow up appointment to discuss results.

## 2013-10-29 NOTE — Telephone Encounter (Signed)
Spoke to patient and relayed negative JC virus antibody.  She is scheduled today for her MRIs and we will call for follow up appointment after results have come back.

## 2013-10-30 ENCOUNTER — Telehealth: Payer: Self-pay | Admitting: Neurology

## 2013-10-30 NOTE — Telephone Encounter (Signed)
I called, and failed to reach patient left a message,   Haley [name], please call patient again repeat MRI of the brain, and cervical cord, continued to demonstrate active process of relapsing remitting multiple sclerosis, JC virus antibody was negative, She should continue with Tysarbri infusion as previously planned,

## 2013-10-31 NOTE — Telephone Encounter (Signed)
Spoke to patient and she would like to have JC virus antibody blood test run one more time.  I told her I would have doctor put in order and she will come in next week.

## 2013-10-31 NOTE — Telephone Encounter (Signed)
Haley [name], Please let patient know, it is not helpful to have repeat JC-antibody so close together, we usually follow up q 6 months, will draw sample again in 6 months,  She was negative twice,

## 2013-11-03 NOTE — Telephone Encounter (Signed)
Left message on home number and cell number for patient not to have a repeat JCV antibody test so close together, per Dr. Terrace ArabiaYan.  The doctor will draw sample in 6 months. If she has any further questions please call office.

## 2013-11-06 ENCOUNTER — Telehealth: Payer: Self-pay | Admitting: Neurology

## 2013-11-06 NOTE — Telephone Encounter (Signed)
Diamond Nickel with [employer] @ [phone], calling to inform [name] patient does not want to start Tysabri.

## 2013-12-05 NOTE — Telephone Encounter (Signed)
Dr. Terrace Arabia is aware that patient doesn't want to start Tysabri.  I have left message for patient that she can discuss this at her follow up appointment on 01-07-14.

## 2013-12-18 ENCOUNTER — Telehealth: Payer: Self-pay | Admitting: Neurology

## 2013-12-18 NOTE — Telephone Encounter (Signed)
Spouse called and stated patient has experiencing vomiting, body numbness, and tongue numbness in a last day or so.  Questioning if she could have IV Steroid injections at home.  Also questioning other treatments beside tysabri.  Please call anytime and if not available please call hme # [phone], can leave message if not available.  Thanks

## 2013-12-19 MED ORDER — METHYLPREDNISOLONE (PAK) 4 MG PO TABS: ORAL_TABLET | ORAL | Status: DC

## 2013-12-19 NOTE — Telephone Encounter (Signed)
Called patient to get more information. Left message that Dr.Yan will call back at the end of the day.

## 2013-12-19 NOTE — Telephone Encounter (Signed)
Patient called back and she states that her toes are numb now. Patient states that she cant eat and drink her tongue  Feels numb Patient wants to no if she  Call [phone] or [phone].

## 2013-12-19 NOTE — Telephone Encounter (Signed)
Left message that the doctor called in a Medrol pac for her and that we will call on Monday to set up a follow up appointment.

## 2013-12-19 NOTE — Telephone Encounter (Signed)
Patient calling to state that patient's entire left side of face is now numb as well as the left side of her tongue, please return call to patient and advise.

## 2013-12-19 NOTE — Telephone Encounter (Signed)
Patient's fiance calling back to state that they got a missed call, please return call to patient and advise.

## 2013-12-19 NOTE — Telephone Encounter (Signed)
Please give her a follow up in my next available.   She complains 3 months history of numbness from waist down, left-sided numbness, I have called in Medrol Pak  She denies fever, no signs of urinary tract infection, no gait difficulty

## 2013-12-23 NOTE — Telephone Encounter (Signed)
Pt has an appt 01-07-14 at 1200 with Dr. Terrace ArabiaYan.

## 2013-12-25 ENCOUNTER — Encounter (HOSPITAL_COMMUNITY): Payer: Self-pay | Admitting: Emergency Medicine

## 2013-12-25 ENCOUNTER — Emergency Department (HOSPITAL_COMMUNITY)
Admission: EM | Admit: 2013-12-25 | Discharge: 2013-12-25 | Disposition: A | Discharge status: Home / Self Care | Payer: Medicaid Other | Attending: Emergency Medicine | Admitting: Emergency Medicine

## 2013-12-25 ENCOUNTER — Telehealth: Payer: Self-pay | Admitting: Neurology

## 2013-12-25 DIAGNOSIS — Z880 Allergy status to penicillin: Secondary | ICD-10-CM | POA: Diagnosis not present

## 2013-12-25 DIAGNOSIS — G35 Multiple sclerosis: Secondary | ICD-10-CM | POA: Diagnosis not present

## 2013-12-25 DIAGNOSIS — Z8659 Personal history of other mental and behavioral disorders: Secondary | ICD-10-CM | POA: Diagnosis not present

## 2013-12-25 DIAGNOSIS — Z87891 Personal history of nicotine dependence: Secondary | ICD-10-CM | POA: Diagnosis not present

## 2013-12-25 DIAGNOSIS — R209 Unspecified disturbances of skin sensation: Secondary | ICD-10-CM | POA: Insufficient documentation

## 2013-12-25 DIAGNOSIS — R011 Cardiac murmur, unspecified: Secondary | ICD-10-CM | POA: Insufficient documentation

## 2013-12-25 DIAGNOSIS — Z87440 Personal history of urinary (tract) infections: Secondary | ICD-10-CM | POA: Insufficient documentation

## 2013-12-25 DIAGNOSIS — Z8619 Personal history of other infectious and parasitic diseases: Secondary | ICD-10-CM | POA: Diagnosis not present

## 2013-12-25 MED ORDER — PREDNISONE 50 MG PO TABS: 50.0000 mg | ORAL_TABLET | Freq: Every day | ORAL | Status: DC

## 2013-12-25 MED ORDER — PREDNISONE 50 MG PO TABS
60.0000 mg | ORAL_TABLET | Freq: Once | ORAL | Status: AC
  Administered 2013-12-25: 60 mg via ORAL
  Filled 2013-12-25 (×2): qty 1

## 2013-12-25 NOTE — Telephone Encounter (Signed)
Patient stated L side of face is still numb, after taking methylPREDNIsolone (MEDROL DOSPACK) 4 MG tablet.  Questioning if she could come in for IV instead.  Please return call and advise.  Thanks

## 2013-12-25 NOTE — Telephone Encounter (Signed)
For numbness only without weakness, recommend oral steroid only. -VRP

## 2013-12-25 NOTE — Telephone Encounter (Signed)
Dr Terrace Arabia is out of the office, forwarding request to [facility] for review.

## 2013-12-25 NOTE — ED Provider Notes (Signed)
CSN: [id number]     Arrival date & time 12/25/13  2208 History   First MD Initiated Contact with Patient 12/25/13 2259   This chart was scribed for Dione Booze, MD by Gwenevere Abbot, ED scribe. This patient was seen in room APA11/APA11 and the patient's care was started at 11:06 PM.    No chief complaint on file.   The history is provided by the patient. No language interpreter was used.   HPI Comments:  Haley [name] is a 23 y.o. female with a h/o MS who presents to the Emergency Department complaining of facial numbness on the left side that is spreading to her throat, onset 8 days ago. Pt also reports that it feels strange to swallow, and that symptoms became worse two days ago. Pt states that she is usually weak. Pt reports that her left foot tingles and that she has trouble bending her toes. Pt currently sees a neurologist and has been treated with pregnozone. Pt attempted to get in contact with her neurologist, Dr. Terrace Arabia, concerning present symptoms, and was unsuccessful.   Past Medical History  Diagnosis Date  . Heart murmur   . Hx of chlamydia infection   . Abnormal Pap smear   . HSV-2 (herpes simplex virus 2) infection   . Pregnant   . Mental disorder     depression,postpartum  . Postpartum depression 04/03/2013  . Right foot drop 04/03/2013    Has - babinski, some muscle weakness  Noted when trying to curl toes  . Multiple sclerosis   . Optic neuritis     Right  . UTI (lower urinary tract infection) 09/22/2013   Past Surgical History  Procedure Laterality Date  . Wisdom tooth extraction  2012  . Cesarean section N/A 01/27/2013    Procedure: CESAREAN SECTION;  Surgeon: Catalina Antigua, MD;  Location: [facility];  Service: Obstetrics;  Laterality: N/A;   Family History  Problem Relation Age of Onset  . Cancer Paternal Grandmother     breast  . Anxiety disorder Mother   . Heart disease Mother   . Stroke Paternal Aunt    History  Substance Use Topics  . Smoking status: Former  Smoker    Types: Cigarettes    Quit date: 12/14/2010  . Smokeless tobacco: Never Used  . Alcohol Use: No   OB History   Grav Para Term Preterm Abortions TAB SAB Ect Mult Living   2 2 1 1      2      Review of Systems  Neurological: Positive for weakness and numbness.  All other systems reviewed and are negative.     Allergies  Penicillins  Home Medications   Prior to Admission medications   Medication Sig Start Date End Date Taking? Authorizing Provider  acetaminophen (TYLENOL) 500 MG tablet Take 1,000 mg by mouth once as needed for mild pain or moderate pain.   Yes Historical Provider, MD  methylPREDNIsolone (MEDROL DOSPACK) 4 MG tablet Take by mouth as directed. follow package directions 12/19/13   Levert Feinstein, MD   BP 144/96  Pulse 98  Temp(Src) 98.9 F (37.2 C) (Oral)  Resp 18  Ht 5\' 1"  (1.549 m)  Wt 130 lb (58.968 kg)  BMI 24.58 kg/m2  SpO2 99%  LMP 12/11/2013 Physical Exam  Nursing note and vitals reviewed. Constitutional: She is oriented to person, place, and time. She appears well-developed and well-nourished.  HENT:  Head: Normocephalic and atraumatic.  Eyes: EOM are normal. Pupils are equal, round,  and reactive to light.  Neck: Normal range of motion. Neck supple. No JVD present.  Cardiovascular: Normal rate, regular rhythm and normal heart sounds.   No murmur heard. Pulmonary/Chest: Effort normal and breath sounds normal. She has no wheezes. She has no rales. She exhibits no tenderness.  Abdominal: Soft. Bowel sounds are normal. She exhibits no distension and no mass. There is no tenderness.  Musculoskeletal: Normal range of motion. She exhibits no edema.  Lymphadenopathy:    She has no cervical adenopathy.  Neurological: She is alert and oriented to person, place, and time. She has normal reflexes. She exhibits normal muscle tone.  Decreased sensation left malar, left anterior neck, right side of abdomen. Mild weakness of left toes and ankle. Strength 4/5.    Skin: Skin is warm and dry. No rash noted.  Psychiatric: She has a normal mood and affect. Her behavior is normal. Thought content normal.    ED Course  Procedures  DIAGNOSTIC STUDIES: Oxygen Saturation is 99% on RA, normal by my interpretation.  COORDINATION OF CARE: 11:16 PM-Discussed treatment plan with pt at bedside and pt agreed to plan.  MDM   Final diagnoses:  Multiple sclerosis exacerbation    Multiple sclerosis exacerbation. Old records are reviewed and she had called her neurologist's office to ask if intravenous steroids would be indicated and her neurologist was suggesting that oral steroids to be used and less she is developing motor weakness. She is discharged with a prescription for prednisone and she will be kept on moderate dose prednisone for the next 5 days and she is to follow up with her neurologist to discuss appropriate steroid taper.  I personally performed the services described in this documentation, which was scribed in my presence. The recorded information has been reviewed and is accurate.       Dione Boozeavid Nicha Hemann, MD 12/26/13 [phone]

## 2013-12-25 NOTE — ED Notes (Signed)
Patient c/o left sided facial numbness; states called her neurologist and was put on steroid for the past 6 days; patient c/o continued numbness to left side of her face and tongue.  Patient states she has MS.

## 2013-12-25 NOTE — Discharge Instructions (Signed)
Your are being given a five day course of a moderately high dose of prednisone. You will need to taper the dose down after that. Please contact your neurologist to arrange for the steroid taper.   Multiple Sclerosis Multiple sclerosis (MS) is a disease of the central nervous system. It leads to the loss of the insulating covering of the nerves (myelin sheath) of your brain. When this happens, brain signals do not get sent properly or may not get sent at all. The age of onset of MS varies.  CAUSES The cause of MS is unknown. However, it is more common in the [place] than in the [place]. RISK FACTORS There is a higher number of women with MS than men. MS is not an illness that is passed down to you from your family members (inherited). However, your risk of MS is higher if you have a relative with MS. SIGNS AND SYMPTOMS  The symptoms of MS occur in episodes or attacks. These attacks may last weeks to months. There may be long periods of almost no symptoms between attacks. The symptoms of MS vary. This is because of the many different ways it affects the central nervous system. The main symptoms of MS include:  Vision problems and eye pain.  Numbness.  Weakness.  Inability to move your arms, hands, feet, or legs (paralysis).  Balance problems.  Tremors. DIAGNOSIS  Your health care provider can diagnose MS with the help of imaging exams and lab tests. These may include specialized X-ray exams and spinal fluid tests. The best imaging exam to confirm a diagnosis of MS is an MRI. TREATMENT  There is no known cure for MS, but there are medicines that can decrease the number and frequency of attacks. Steroids are often used for short-term relief. Physical and occupational therapy may also help. There are also many new alternative or complementary treatments available to help control the symptoms of MS. Ask your health care provider if any of these other options are  right for you. HOME CARE INSTRUCTIONS   Take medicines as directed by your health care provider.  Exercise as directed by your health care provider. SEEK MEDICAL CARE IF: You begin to feel depressed. SEEK IMMEDIATE MEDICAL CARE IF:  You develop paralysis.  You have problems with bladder, bowel, or sexual function.  You develop mental changes, such as forgetfulness or mood swings.  You have a period of uncontrolled movements (seizure). Document Released: 04/28/2000 Document Revised: 05/06/2013 Document Reviewed: 01/06/2013 [facility] Patient Information 2015 [employer] [place]. This information is not intended to replace advice given to you by your health care provider. Make sure you discuss any questions you have with your health care provider.  Prednisone tablets What is this medicine? PREDNISONE (PRED ni sone) is a corticosteroid. It is commonly used to treat inflammation of the skin, joints, lungs, and other organs. Common conditions treated include asthma, allergies, and arthritis. It is also used for other conditions, such as blood disorders and diseases of the adrenal glands. This medicine may be used for other purposes; ask your health care provider or pharmacist if you have questions. COMMON BRAND NAME(S): Deltasone, Predone, Sterapred, Sterapred DS What should I tell my health care provider before I take this medicine? They need to know if you have any of these conditions: -Cushing's syndrome -diabetes -glaucoma -heart disease -high blood pressure -infection (especially a virus infection such as chickenpox, cold sores, or herpes) -kidney disease -liver disease -mental illness -myasthenia gravis -osteoporosis -  seizures -stomach or intestine problems -thyroid disease -an unusual or allergic reaction to lactose, prednisone, other medicines, foods, dyes, or preservatives -pregnant or trying to get pregnant -breast-feeding How should I use this medicine? Take this medicine  by mouth with a glass of water. Follow the directions on the prescription label. Take this medicine with food. If you are taking this medicine once a day, take it in the morning. Do not take more medicine than you are told to take. Do not suddenly stop taking your medicine because you may develop a severe reaction. Your doctor will tell you how much medicine to take. If your doctor wants you to stop the medicine, the dose may be slowly lowered over time to avoid any side effects. Talk to your pediatrician regarding the use of this medicine in children. Special care may be needed. Overdosage: If you think you have taken too much of this medicine contact a poison control center or emergency room at once. NOTE: This medicine is only for you. Do not share this medicine with others. What if I miss a dose? If you miss a dose, take it as soon as you can. If it is almost time for your next dose, talk to your doctor or health care professional. You may need to miss a dose or take an extra dose. Do not take double or extra doses without advice. What may interact with this medicine? Do not take this medicine with any of the following medications: -metyrapone -mifepristone This medicine may also interact with the following medications: -aminoglutethimide -amphotericin B -aspirin and aspirin-like medicines -barbiturates -certain medicines for diabetes, like glipizide or glyburide -cholestyramine -cholinesterase inhibitors -cyclosporine -digoxin -diuretics -ephedrine -female hormones, like estrogens and birth control pills -isoniazid -ketoconazole -NSAIDS, medicines for pain and inflammation, like ibuprofen or naproxen -phenytoin -rifampin -toxoids -vaccines -warfarin This list may not describe all possible interactions. Give your health care provider a list of all the medicines, herbs, non-prescription drugs, or dietary supplements you use. Also tell them if you smoke, drink alcohol, or use illegal  drugs. Some items may interact with your medicine. What should I watch for while using this medicine? Visit your doctor or health care professional for regular checks on your progress. If you are taking this medicine over a prolonged period, carry an identification card with your name and address, the type and dose of your medicine, and your doctor's name and address. This medicine may increase your risk of getting an infection. Tell your doctor or health care professional if you are around anyone with measles or chickenpox, or if you develop sores or blisters that do not heal properly. If you are going to have surgery, tell your doctor or health care professional that you have taken this medicine within the last twelve months. Ask your doctor or health care professional about your diet. You may need to lower the amount of salt you eat. This medicine may affect blood sugar levels. If you have diabetes, check with your doctor or health care professional before you change your diet or the dose of your diabetic medicine. What side effects may I notice from receiving this medicine? Side effects that you should report to your doctor or health care professional as soon as possible: -allergic reactions like skin rash, itching or hives, swelling of the face, lips, or tongue -changes in emotions or moods -changes in vision -depressed mood -eye pain -fever or chills, cough, sore throat, pain or difficulty passing urine -increased thirst -swelling of ankles, feet  Side effects that usually do not require medical attention (report to your doctor or health care professional if they continue or are bothersome): -confusion, excitement, restlessness -headache -nausea, vomiting -skin problems, acne, thin and shiny skin -trouble sleeping -weight gain This list may not describe all possible side effects. Call your doctor for medical advice about side effects. You may report side effects to FDA at  1-800-FDA-1088. Where should I keep my medicine? Keep out of the reach of children. Store at room temperature between 15 and 30 degrees C (59 and 86 degrees F). Protect from light. Keep container tightly closed. Throw away any unused medicine after the expiration date. NOTE: This sheet is a summary. It may not cover all possible information. If you have questions about this medicine, talk to your doctor, pharmacist, or health care provider.  2015, [employer]. (2010-12-15 10:57:14)

## 2013-12-25 NOTE — Telephone Encounter (Signed)
Dr. Marjory Lies please see note below

## 2013-12-26 MED ORDER — PREDNISONE 20 MG PO TABS: ORAL_TABLET | ORAL | Status: DC

## 2013-12-26 NOTE — Telephone Encounter (Signed)
Once she is done with the 5 days of 50 mg, she can go to 40 mg for 1 day, 30 mg for one day, 20 mg for one day, then 10 mg for one day then stop. Rx sent to pharm

## 2013-12-26 NOTE — Telephone Encounter (Signed)
Spoke to [facility] and she agreed that the patient will need to fill oral steroid prescription.  I returned call to patient and relayed this information. She has an appointment 01-07-14 to see Dr. Terrace Arabia and discuss disease modifying therapy for her MS.  Also for WID:  Patient is asking for a taper off steroids.  Her Rx from the ED is Prednisone 50mg  daily for 5 days.

## 2013-12-26 NOTE — Telephone Encounter (Signed)
WID:  Spoke to patient, who went to ER last night.  They read note below and gave the patient Prednisone 60mg , and a prescription to pick up.  The patient relayed that she is having a difficult time swallowing, it started with her tongue being numb on the left side and now is going down her throat on the left.  She is concerned and would like further recommendations.

## 2013-12-26 NOTE — Telephone Encounter (Signed)
Called pt to inform her per Dr. Frances FurbishAthar once the pt is done with the 5 days of 50 mg, she can go to 40 mg for 1 day, 30 mg for one day, 20 mg for one day, then 10 mg for one day then stop and that the Rx was sent to the pharmacy. I advised the pt that if she has any other problems, questions or concerns to call the office. Pt verbalized understanding.

## 2013-12-26 NOTE — Telephone Encounter (Signed)
Patient calling back to state that she forgot to said that the doctor at the ER last night said that she would need something to help taper her off of the steroids, please return call to patient and advise.

## 2014-01-07 ENCOUNTER — Encounter: Payer: Self-pay | Admitting: Neurology

## 2014-01-07 ENCOUNTER — Ambulatory Visit (INDEPENDENT_AMBULATORY_CARE_PROVIDER_SITE_OTHER): Payer: Medicaid Other | Admitting: Neurology

## 2014-01-07 VITALS — BP 116/75 | HR 91 | Ht 61.0 in | Wt 135.0 lb

## 2014-01-07 DIAGNOSIS — G35D Multiple sclerosis, unspecified: Secondary | ICD-10-CM

## 2014-01-07 DIAGNOSIS — G35 Multiple sclerosis: Secondary | ICD-10-CM

## 2014-01-07 NOTE — Progress Notes (Addendum)
[facility]  PATIENT: Haley [name] DOB: 05/30/1990  HISTORICAL ( initial visit Dec 2014)  Haley [name] is a 23 years old right-handed Caucasian female, referred by her obstetrician for evaluation of abnormal MRI of brain, consistent with multiple sclerosis  Haley [name] was previously healthy, Uneventful pregnancy 6 years ago, Haley [name] has a normal healthy child at 74 years old, during her most recent pregnancy, at [redacted] weeks pregnant, Haley [name] suddenly developed lower abdominal pain, presented to the emergency room, was found to have elevated blood pressure 250 over 130, acute liver failure, elevated AST 1200, Haley [name] had emergency C-section, her daughter was born at 56 weeks 3 days, Haley [name] was diagnosed with eclampsia, her blood pressure, and laboratory evaluation has much improved afterwards.   During her postpartum followup in April 03 2013, Haley [name] reported a week history of right foot drop, Haley [name] was referred for MRI of brain, which demonstrated numerous intracranial lesions many which demonstrate enhancement involving supratentorial and infratentorial region. Index enhancing  lesion within the posterior medial left frontal lobe measures 2 x 1.2 x 1.1 cm with marked surrounding vasogenic edema. White matter lesions extend into the corpus callosum.  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 This is located centrally and ventrally in the midline. No  enhancement of this lesion. No other cervical cord lesions are identified.  MRI thoracic spine showed 1 cm nonenhancing cord lesion at T2-3. 6 mm slightly enhancing cord lesion at T12  Laboratory showed normal and active HIV, RPR, ANA, ESR,   CSF showed total protein 35, pulse 58, negative VDRL, more than 5 oligoclonal banding, WBC 13, RBC 3,  Haley [name] complains of difficulty focusing, anxiety attacks, a week  history of bilateral lower extremity deep achy pain, Haley [name] denies visual loss, denies gait difficulty  JC virus antibody (05/03/2013) was negative with titer of 0.16,  Repeat test June 5th 2015, was negative, titer 0.15, negative 01/09/09, titer 0.10,   UPDATE June 4th 2015: Haley [name] was evaluated by my colleague Dr. Jannifer Franklin, right optic neuritis, right eye pain, right-sided blurry vision,  in April 3rd 2015, visual evoked potential was normal, Haley [name] was treated with iv Solu-Medrol for 3 days, which did help her visual symptoms,  Since May 15th 2015, Haley [name] noticed right low thoracic numbness, mild gait difficulty, Haley [name] has frequent UTIs, not empty bladder completely.  Haley [name] stays home for her children of 36 years old, 9 months.   UPDATE August 26th 2015:  Since last visit in June 4th 2015, Haley [name] has right chest numbness last few weeks, resolved by steroid,  Then Haley [name] developed left face numbness.  Haley [name] has mild dysarthria, falling,   We had reviewed repeat MRI of brain without contrast in June 2015  1. Multiple periventricular and subcortical and pontine and cerebellar chronic demyelinating plaques. Some of these are hypointense on T1 views.  2. Small left frontal juxtacortical enhancing/acute demyelinating plaque.  3. Compared to MRI on 04/03/13, there has been resolution of multiple enhancing plaques with improvement in previously noted edema. Also there is a new small left frontal enhancing plaque.   MRI cervical spine (with and without) demonstrating:  1. Chronic demyelinating plaques at C4 and T2-3 levels.  2. No acute plaques.  3. No change from MRI on 04/06/13.   Haley [name] is no longer breast feeding, wants to try Tysabri after repeat  JC virus antibody  REVIEW OF SYSTEMS:  Trouble swallowing,  Memory loss, numbness, weakness, speech difficulty, agitations  ALLERGIES: Allergies  Allergen Reactions  . Penicillins Other (See Comments)    Unknown childhood reaction    HOME MEDICATIONS: Outpatient  Prescriptions Prior to Visit  Medication Sig Dispense Refill  . ALPRAZolam (XANAX) 0.25 MG tablet Take 1 tablet (0.25 mg total) by mouth 2 (two) times daily as needed for anxiety.  16 tablet  0  . escitalopram (LEXAPRO) 10 MG tablet Take 1 tablet (10 mg total) by mouth daily.  30 tablet  6  . oxyCODONE-acetaminophen (PERCOCET/ROXICET) 5-325 MG per tablet Take 1-2 tablets by mouth every 4 (four) hours as needed for severe pain.  21 tablet  0     PAST MEDICAL HISTORY: Past Medical History  Diagnosis Date  . Heart murmur   . Hx of chlamydia infection   . Abnormal Pap smear   . HSV-2 (herpes simplex virus 2) infection   . Mental disorder   . Postpartum depression 04/03/2013  . Right foot drop 04/03/2013  . Multiple sclerosis     PAST SURGICAL HISTORY: Past Surgical History  Procedure Laterality Date  . Wisdom tooth extraction  2012  . Cesarean section N/A 01/27/2013    Procedure: CESAREAN SECTION;  Surgeon: Mora Bellman, MD;  Location: [facility];  Service: Obstetrics;  Laterality: N/A;    FAMILY HISTORY: Family History  Problem Relation Age of Onset  . Cancer Paternal Grandmother     breast  . Anxiety disorder Mother   . Heart disease Mother   . Stroke Paternal Aunt     SOCIAL HISTORY:  History   Social History  . Marital Status: Single    Spouse Name: N/A    Number of Children: 2  . Years of Education: GED+   Occupational History    unemployed    Electronics engineer   Social History Main Topics  . Smoking status: Former Smoker    Types: Cigarettes    Quit date: 12/14/2010  . Smokeless tobacco: Never Used  . Alcohol Use: No  . Drug Use: No  . Sexual Activity: Not Currently    Birth Control/ Protection: None   Other Topics Concern  . Not on file   Social History Narrative   Haley [name] lives home with finance (Will Pottsgrove) and two children   Haley [name] is right handed   Education GED, but is currently a part time student   Haley [name] may consume one cup of caffeine on a daily basis       PHYSICAL EXAM   Filed Vitals:   01/07/14 1248  BP: 116/75  Pulse: 91  Height: _0  (1.549 m)  Weight: 135 lb (61.236 kg)    Not recorded    Body mass index is 25.52 kg/(m^2).   Generalized: In no acute distress  Neck: Supple, no carotid bruits   Cardiac: Regular rate rhythm  Pulmonary: Clear to auscultation bilaterally  Musculoskeletal: No deformity  Neurological examination  Mentation: Alert oriented to time, place, history taking, and causual conversation  Cranial nerve II-XII: Pupils were equal round reactive to light extraocular movements were full, Visual field were full on confrontational test. Bilateral fundi were sharp. Smooth pursuit eye movement was broken up into small catchup saccade, Facial sensation and strength were normal. Hearing was intact to finger rubbing bilaterally. Uvula tongue midline.  head turning and shoulder shrug and were normal and symmetric.Tongue protrusion into cheek strength was normal.  Motor: normal tone,  bulk and strength, with exception of mild bilateral lower extremity spasticity  Sensory: Decreased fine touch, pinprick at left face, left leg  Coordination: Normal finger to nose, heel-to-shin bilaterally there was no truncal ataxia  Gait: Rising up from seated position without assistance, mild unsteady, stiff gait, mild difficulty with tandem, tiptoe walking, heel walking,  Romberg signs: Negative  Deep tendon reflexes: Brachioradialis 2/2, biceps 2/2, triceps 2/2, patellar 3/3, Achilles 2/2, plantar responses were flexor bilaterally.   DIAGNOSTIC DATA (LABS, IMAGING, TESTING) - I reviewed patient records, labs, notes, testing and imaging myself where available.  Lab Results  Component Value Date   WBC 9.9 10/16/2013   HGB 12.5 10/16/2013   HCT 36.3 10/16/2013   MCV 84 10/16/2013   PLT 290 10/16/2013      Component Value Date/Time   NA 139 10/16/2013 1410   NA 141 04/04/2013 0604   K 4.4 10/16/2013 1410   CL 102 10/16/2013  1410   CO2 25 10/16/2013 1410   GLUCOSE 96 10/16/2013 1410   GLUCOSE 104* 04/04/2013 0604   BUN 14 10/16/2013 1410   BUN 13 04/04/2013 0604   CREATININE 0.57 10/16/2013 1410   CREATININE 0.62 04/03/2013 1145   CALCIUM 9.4 10/16/2013 1410   PROT 7.0 10/16/2013 1410   PROT 6.7 04/04/2013 0604   ALBUMIN 3.4* 04/04/2013 0604   AST 15 10/16/2013 1410   ALT 12 10/16/2013 1410   ALKPHOS 73 10/16/2013 1410   BILITOT 0.2 10/16/2013 1410   GFRNONAA 130 10/16/2013 1410   GFRAA 150 10/16/2013 1410   No results found for this basename: CHOL,  HDL,  LDLCALC,  LDLDIRECT,  TRIG,  CHOLHDL   No results found for this basename: HGBA1C   Lab Results  Component Value Date   VITAMINB12 603 04/04/2013   Lab Results  Component Value Date   TSH 1.620 04/03/2013      ASSESSMENT AND PLAN   23 years old female, postpartum, with relapsing remitting multiple sclerosis. Her MRIs were markedly abnormal, enhancing lesions involving brain, cervical, thoracic spine, spinal fluid showed more than 5 oligoclonal banding, Haley [name] has JC virus antibody was negative twice ((05/03/2013, June 5th 2015), Haley [name] is no longer on breast-feeding,   flareup since April 2015, right optic neuritis, Sep 26 2013, right thoracic area numbness, left facial numbness  1.   I have reviewed and compared the MRIs with patient and her mother, husband 2.  After discussion, we decided to proceed with Dwyane Dee, Haley [name] is JC-virus negative, but wants to have repeat testing before proceeding with infusion. 3.   Jc-virus antibody every 3 months, MRI brain w/wo q6 months. 4. RTC in 2-3 months   Marcial Pacas, M.D. Ph.D.  [facility] [address] [place], [place] [address] [phone] and

## 2014-01-08 ENCOUNTER — Other Ambulatory Visit: Payer: Medicaid Other

## 2014-01-08 ENCOUNTER — Ambulatory Visit (INDEPENDENT_AMBULATORY_CARE_PROVIDER_SITE_OTHER): Payer: Medicaid Other | Admitting: *Deleted

## 2014-01-08 DIAGNOSIS — G35 Multiple sclerosis: Secondary | ICD-10-CM

## 2014-01-08 NOTE — Progress Notes (Signed)
Pt here for lab draw of JCV.  Made comfortable in chair.  Under aseptic technique 23g butterfly needle inserted to R AC with good blood return, specimen obtained. Pressure and bandage applied.  Sent to [facility].

## 2014-01-08 NOTE — Patient Instructions (Signed)
Pt NAD. To check out.

## 2014-02-12 NOTE — Telephone Encounter (Signed)
Patient was seen 01/07/14.

## 2014-02-27 ENCOUNTER — Other Ambulatory Visit: Payer: Self-pay

## 2014-03-04 ENCOUNTER — Telehealth: Payer: Self-pay | Admitting: Neurology

## 2014-03-04 NOTE — Telephone Encounter (Signed)
Patient calling for blood work results.  Please call and advise. °

## 2014-03-05 NOTE — Telephone Encounter (Signed)
Haley [name], Sandy, please find her Jc-virus titer report

## 2014-03-06 NOTE — Telephone Encounter (Signed)
I have called her JC virus was negative with titer 0.1 in August 2015.  Haley [name]: give her follow up appt in next available.

## 2014-03-06 NOTE — Telephone Encounter (Signed)
Doctor Terrace ArabiaYan in your in box.

## 2014-03-09 NOTE — Telephone Encounter (Signed)
Called and spoke to patient to schedule her follow up appt. Patient said she would have to call back to schedule there car is broke down. When patient calls back put her in first available  Slot.

## 2014-03-16 ENCOUNTER — Encounter: Payer: Self-pay | Admitting: Neurology

## 2014-03-24 ENCOUNTER — Ambulatory Visit: Payer: Medicaid Other | Admitting: Neurology

## 2014-04-02 ENCOUNTER — Telehealth: Payer: Self-pay | Admitting: Neurology

## 2014-04-02 ENCOUNTER — Ambulatory Visit: Payer: Medicaid Other | Admitting: Neurology

## 2014-04-02 NOTE — Telephone Encounter (Signed)
Donell SievertAngie ,Tracy and Marylu LundJanet   Please advise me on this Patient.

## 2014-04-02 NOTE — Telephone Encounter (Signed)
Please talk with Haley [name] about this patient, please review her appt record, to see if we need to discharge her from our clinic.

## 2014-04-20 NOTE — Telephone Encounter (Signed)
Haley [name], please update me on this patient

## 2014-04-20 NOTE — Telephone Encounter (Signed)
Spoke to [place] and she will talking to Angie to get patient dismissed. Karoline Caldwell is out of the office today.04-20-2014.

## 2014-04-21 ENCOUNTER — Encounter: Payer: Self-pay | Admitting: Neurology

## 2014-04-23 NOTE — Telephone Encounter (Signed)
Patient has been dismissed per Angie.

## 2014-12-22 ENCOUNTER — Encounter (HOSPITAL_COMMUNITY): Payer: Self-pay | Admitting: *Deleted

## 2014-12-22 ENCOUNTER — Emergency Department (HOSPITAL_COMMUNITY)
Admission: EM | Admit: 2014-12-22 | Discharge: 2014-12-22 | Disposition: A | Discharge status: Home / Self Care | Payer: Medicaid Other | Attending: Emergency Medicine | Admitting: Emergency Medicine

## 2014-12-22 DIAGNOSIS — Z8619 Personal history of other infectious and parasitic diseases: Secondary | ICD-10-CM | POA: Insufficient documentation

## 2014-12-22 DIAGNOSIS — Z87440 Personal history of urinary (tract) infections: Secondary | ICD-10-CM | POA: Insufficient documentation

## 2014-12-22 DIAGNOSIS — Z8659 Personal history of other mental and behavioral disorders: Secondary | ICD-10-CM | POA: Insufficient documentation

## 2014-12-22 DIAGNOSIS — Z8739 Personal history of other diseases of the musculoskeletal system and connective tissue: Secondary | ICD-10-CM | POA: Insufficient documentation

## 2014-12-22 DIAGNOSIS — Z87891 Personal history of nicotine dependence: Secondary | ICD-10-CM | POA: Insufficient documentation

## 2014-12-22 DIAGNOSIS — R42 Dizziness and giddiness: Secondary | ICD-10-CM | POA: Diagnosis present

## 2014-12-22 DIAGNOSIS — G35 Multiple sclerosis: Secondary | ICD-10-CM | POA: Diagnosis not present

## 2014-12-22 DIAGNOSIS — R270 Ataxia, unspecified: Secondary | ICD-10-CM | POA: Insufficient documentation

## 2014-12-22 DIAGNOSIS — Z880 Allergy status to penicillin: Secondary | ICD-10-CM | POA: Insufficient documentation

## 2014-12-22 DIAGNOSIS — R011 Cardiac murmur, unspecified: Secondary | ICD-10-CM | POA: Insufficient documentation

## 2014-12-22 HISTORY — DX: Other specified anxiety disorders: F41.8

## 2014-12-22 HISTORY — DX: Borderline personality disorder: F60.3

## 2014-12-22 HISTORY — DX: Post-traumatic stress disorder, unspecified: F43.10

## 2014-12-22 LAB — CBC WITH DIFFERENTIAL/PLATELET
BASOS ABS: 0 10*3/uL (ref 0.0–0.1)
Basophils Relative: 0 % (ref 0–1)
EOS ABS: 0.1 10*3/uL (ref 0.0–0.7)
Eosinophils Relative: 1 % (ref 0–5)
HEMATOCRIT: 36.7 % (ref 36.0–46.0)
Hemoglobin: 12.1 g/dL (ref 12.0–15.0)
Lymphocytes Relative: 26 % (ref 12–46)
Lymphs Abs: 2.3 10*3/uL (ref 0.7–4.0)
MCH: 28.6 pg (ref 26.0–34.0)
MCHC: 33 g/dL (ref 30.0–36.0)
MCV: 86.8 fL (ref 78.0–100.0)
MONO ABS: 0.7 10*3/uL (ref 0.1–1.0)
MONOS PCT: 8 % (ref 3–12)
Neutro Abs: 5.7 10*3/uL (ref 1.7–7.7)
Neutrophils Relative %: 65 % (ref 43–77)
Platelets: 267 10*3/uL (ref 150–400)
RBC: 4.23 MIL/uL (ref 3.87–5.11)
RDW: 12.9 % (ref 11.5–15.5)
WBC: 8.8 10*3/uL (ref 4.0–10.5)

## 2014-12-22 LAB — COMPREHENSIVE METABOLIC PANEL
ALBUMIN: 4.2 g/dL (ref 3.5–5.0)
ALK PHOS: 51 U/L (ref 38–126)
ALT: 14 U/L (ref 14–54)
AST: 18 U/L (ref 15–41)
Anion gap: 9 (ref 5–15)
BUN: 10 mg/dL (ref 6–20)
CHLORIDE: 107 mmol/L (ref 101–111)
CO2: 23 mmol/L (ref 22–32)
Calcium: 8.8 mg/dL — ABNORMAL LOW (ref 8.9–10.3)
Creatinine, Ser: 0.63 mg/dL (ref 0.44–1.00)
GFR calc non Af Amer: >60 mL/min (ref >60)
GLUCOSE: 98 mg/dL (ref 65–99)
Potassium: 4 mmol/L (ref 3.5–5.1)
Sodium: 139 mmol/L (ref 135–145)
Total Bilirubin: 0.4 mg/dL (ref 0.3–1.2)
Total Protein: 7.3 g/dL (ref 6.5–8.1)

## 2014-12-22 LAB — URINALYSIS, ROUTINE W REFLEX MICROSCOPIC
Bilirubin Urine: NEGATIVE
Glucose, UA: NEGATIVE mg/dL
Hgb urine dipstick: NEGATIVE
Ketones, ur: NEGATIVE mg/dL
Leukocytes, UA: NEGATIVE
Nitrite: NEGATIVE
Protein, ur: NEGATIVE mg/dL
SPECIFIC GRAVITY, URINE: 1.01 (ref 1.005–1.030)
UROBILINOGEN UA: 0.2 mg/dL (ref 0.0–1.0)
pH: 6.5 (ref 5.0–8.0)

## 2014-12-22 MED ORDER — METHYLPREDNISOLONE SODIUM SUCC 1000 MG IJ SOLR
1000.0000 mg | Freq: Once | INTRAMUSCULAR | Status: AC
  Administered 2014-12-22: 1000 mg via INTRAVENOUS
  Filled 2014-12-22: qty 8

## 2014-12-22 MED ORDER — RANITIDINE HCL 150 MG PO TABS: 150.0000 mg | ORAL_TABLET | Freq: Two times a day (BID) | ORAL | Status: DC

## 2014-12-22 MED ORDER — PREDNISONE 50 MG PO TABS: 650.0000 mg | ORAL_TABLET | Freq: Every day | ORAL | Status: AC

## 2014-12-22 MED ORDER — LORAZEPAM 2 MG/ML IJ SOLN
1.0000 mg | Freq: Once | INTRAMUSCULAR | Status: AC
  Administered 2014-12-22: 1 mg via INTRAVENOUS
  Filled 2014-12-22: qty 1

## 2014-12-22 MED ORDER — METHYLPREDNISOLONE SODIUM SUCC 1000 MG IJ SOLR
INTRAMUSCULAR | Status: AC
  Filled 2014-12-22: qty 8

## 2014-12-22 NOTE — ED Notes (Signed)
Pt states she has been dizzy x 5 days. Pt also c/o feeling an "electrical shock sensation" when she bends her neck to look down.

## 2014-12-22 NOTE — Discharge Instructions (Signed)

## 2014-12-22 NOTE — ED Provider Notes (Signed)
CSN: [id number]     Arrival date & time 12/22/14  1855 History   First MD Initiated Contact with Patient 12/22/14 1919     Chief Complaint  Patient presents with  . Dizziness     (Consider location/radiation/quality/duration/timing/severity/associated sxs/prior Treatment) HPI Comments: Pt has MS, has been noncompliant with proposed treatment protocols and has not followed up b/c of recently having a child and having HELLP syndrome - she is now feeling better with regards to that but has had 1 week of progressive and persistent feeling of being off balance - she has no changes in her vision, no numbness or weakness that is new (has some chronic c/o numbness/weakness), and has had trouble walking b/c of balance issues - she has no ha, f/c/n/v/ or other c/o.  She does express a positive Lhermitte's sign with tilting her chin down.    Patient is a 24 y.o. female presenting with dizziness. The history is provided by the patient, the spouse and medical records.  Dizziness   Past Medical History  Diagnosis Date  . Heart murmur   . Hx of chlamydia infection   . Abnormal Pap smear   . HSV-2 (herpes simplex virus 2) infection   . Pregnant   . Mental disorder     depression,postpartum  . Postpartum depression 04/03/2013  . Right foot drop 04/03/2013    Has - babinski, some muscle weakness  Noted when trying to curl toes  . Multiple sclerosis   . Optic neuritis     Right  . UTI (lower urinary tract infection) 09/22/2013  . Borderline personality disorder   . Depression with anxiety   . PTSD (post-traumatic stress disorder)    Past Surgical History  Procedure Laterality Date  . Wisdom tooth extraction  2012  . Cesarean section N/A 01/27/2013    Procedure: CESAREAN SECTION;  Surgeon: Catalina Antigua, MD;  Location: [facility];  Service: Obstetrics;  Laterality: N/A;   Family History  Problem Relation Age of Onset  . Cancer Paternal Grandmother     breast  . Anxiety disorder Mother   . Heart  disease Mother   . Stroke Paternal Aunt    History  Substance Use Topics  . Smoking status: Former Smoker    Types: Cigarettes    Quit date: 12/14/2010  . Smokeless tobacco: Never Used  . Alcohol Use: No   OB History    Gravida Para Term Preterm AB TAB SAB Ectopic Multiple Living   2 2 1 1      2      Review of Systems  Neurological: Positive for dizziness.  All other systems reviewed and are negative.     Allergies  Penicillins  Home Medications   Prior to Admission medications   Medication Sig Start Date End Date Taking? Authorizing Provider  acetaminophen (TYLENOL) 500 MG tablet Take 1,000 mg by mouth every 8 (eight) hours as needed for mild pain or moderate pain.    Yes Historical Provider, MD  ibuprofen (ADVIL,MOTRIN) 200 MG tablet Take 200 mg by mouth every 6 (six) hours as needed for mild pain or moderate pain.   Yes Historical Provider, MD  predniSONE (DELTASONE) 50 MG tablet Take 13 tablets (650 mg total) by mouth at bedtime. 12/23/14 12/24/14  Eber Hong, MD  ranitidine (ZANTAC) 150 MG tablet Take 1 tablet (150 mg total) by mouth 2 (two) times daily. 12/22/14   Eber Hong, MD   BP 116/70 mmHg  Pulse 100  Temp(Src) 98.6 F (37  C) (Oral)  Resp 18  Ht  (1.549 m)  Wt 143 lb (64.864 kg)  BMI 27.03 kg/m2  SpO2 100%  LMP 12/19/2014 Physical Exam  Constitutional: She appears well-developed and well-nourished. No distress.  HENT:  Head: Normocephalic and atraumatic.  Mouth/Throat: Oropharynx is clear and moist. No oropharyngeal exudate.  Eyes: Conjunctivae and EOM are normal. Pupils are equal, round, and reactive to light. Right eye exhibits no discharge. Left eye exhibits no discharge. No scleral icterus.  Neck: Normal range of motion. Neck supple. No JVD present. No thyromegaly present.  Cardiovascular: Normal rate, regular rhythm, normal heart sounds and intact distal pulses.  Exam reveals no gallop and no friction rub.   No murmur heard. Pulmonary/Chest:  Effort normal and breath sounds normal. No respiratory distress. She has no wheezes. She has no rales.  Abdominal: Soft. Bowel sounds are normal. She exhibits no distension and no mass. There is no tenderness.  Musculoskeletal: Normal range of motion. She exhibits no edema or tenderness.  Lymphadenopathy:    She has no cervical adenopathy.  Neurological: She is alert. Coordination normal.  Normal strength, sensation and coordination by gait thoug there is some difficulty walking a straight line - she has normal FNF.  Sensation asymetric (normal for her).    Skin: Skin is warm and dry. No rash noted. No erythema.  Psychiatric: She has a normal mood and affect. Her behavior is normal.  Nursing note and vitals reviewed.   ED Course  Procedures (including critical care time) Labs Review Labs Reviewed  COMPREHENSIVE METABOLIC PANEL - Abnormal; Notable for the following:    Calcium 8.8 (*)    All other components within normal limits  URINALYSIS, ROUTINE W REFLEX MICROSCOPIC (NOT AT [facility])  CBC WITH DIFFERENTIAL/PLATELET    Imaging Review No results found.    MDM   Final diagnoses:  Multiple sclerosis exacerbation  Ataxia    VS unremarkable - likely MS flare - will d/w Neuro re: prednisone.  Solumedrol 1 g ordered,  of prednisone twice daily according to Dr. Amada Jupiter, orders submitted, pt informed.  UA shows no abnormal findings  Pt in agreement with plan.  Meds given in ED:  Medications  methylPREDNISolone sodium succinate (SOLU-MEDROL) 1,000 mg in sodium chloride 0.9 % 50 mL IVPB (1,000 mg Intravenous Given 12/22/14 2113)  LORazepam (ATIVAN) injection 1 mg (1 mg Intravenous Given 12/22/14 2113)    New Prescriptions   PREDNISONE (DELTASONE) 50 MG TABLET    Take 13 tablets (650 mg total) by mouth at bedtime.   RANITIDINE (ZANTAC) 150 MG TABLET    Take 1 tablet (150 mg total) by mouth 2 (two) times daily.      Eber Hong, MD 12/22/14 2234

## 2014-12-22 NOTE — ED Notes (Signed)
Called [facility] for Solumedrol mix from pharmacy.

## 2014-12-23 ENCOUNTER — Telehealth (HOSPITAL_COMMUNITY): Payer: Self-pay

## 2014-12-23 NOTE — Telephone Encounter (Signed)
Pharmacy w/questions regarding Prednisone Rx.  Call transferred to [facility] for clarification.

## 2014-12-23 NOTE — ED Provider Notes (Signed)
Administrative staff request for me to verify a prescription for Prednisone 650mg  PO QHS x2 written by Dr. Eber Hong for MS flair on 12/22/14.  I did consult with oncall neurologist Dr. Noel Christmas who recommend 60mg  PO x 5 days and for patient to f/u with neurologist for further care.    Fayrene Helper, PA-C 12/23/14 1039  Benjiman Core, MD 12/24/14 [phone]

## 2015-05-04 ENCOUNTER — Ambulatory Visit: Payer: Medicaid Other | Admitting: Obstetrics & Gynecology

## 2015-05-05 ENCOUNTER — Ambulatory Visit (INDEPENDENT_AMBULATORY_CARE_PROVIDER_SITE_OTHER): Payer: Medicaid Other | Admitting: Obstetrics & Gynecology

## 2015-05-05 ENCOUNTER — Encounter: Payer: Self-pay | Admitting: Obstetrics & Gynecology

## 2015-05-05 VITALS — BP 120/90 | HR 74 | Ht 61.0 in | Wt 143.0 lb

## 2015-05-05 DIAGNOSIS — B9689 Other specified bacterial agents as the cause of diseases classified elsewhere: Secondary | ICD-10-CM

## 2015-05-05 DIAGNOSIS — N760 Acute vaginitis: Secondary | ICD-10-CM | POA: Diagnosis not present

## 2015-05-05 DIAGNOSIS — A499 Bacterial infection, unspecified: Secondary | ICD-10-CM

## 2015-05-05 MED ORDER — METRONIDAZOLE 0.75 % VA GEL: VAGINAL | Status: DC

## 2015-05-05 NOTE — Progress Notes (Signed)
Patient ID: Haley [name], female   DOB: 1990-08-02, 24 y.o.   MRN: [id number]      Chief Complaint  Patient presents with  . vaginal white discharge and itching    Blood pressure 120/90, pulse 74, height 5\' 1"  (1.549 m), weight 143 lb (64.864 kg), last menstrual period 04/12/2015.  24 y.o. G2P1102 Patient's last menstrual period was 04/12/2015. The current method of family planning is .  Subjective Per cc white discharge moreso irritation than itching  Objective Vulva:  normal appearing vulva with no masses, tenderness or lesions Vagina:  normal mucosa, thin grey discharge Cervix:  no cervical motion tenderness and no lesions Uterus:   Adnexa: ovaries:,      Pertinent ROS No burning with urination, frequency or urgency No nausea, vomiting or diarrhea Nor fever chills or other constitutional symptoms   Labs or studies Wet Prep:   A sample of vaginal discharge was obtained from the posterior fornix using a cotton swab. 2 drops of saline were placed on a slide and the cotton swab was immersed in the saline. Microscopic evaluation was performed and results were as follows:  Negative  for yeast  Positive for clue cells , consistent with Bacterial vaginosis Negative for trichomonas  Normal WBC population   Whiff test: Positive     Impression Diagnoses this Encounter::   ICD-9-CM ICD-10-CM   1. BV (bacterial vaginosis) 616.10 N76.0    041.9 A49.9     Established relevant diagnosis(es):   Plan/Recommendations: Meds ordered this encounter  Medications  . metroNIDAZOLE (METROGEL VAGINAL) 0.75 % vaginal gel    Sig: Nightly x 5 nights    Dispense:  70 g    Refill:  0    Labs or Scans Ordered: No orders of the defined types were placed in this encounter.    Management::   Follow up prn        All questions were answered.

## 2015-06-28 ENCOUNTER — Ambulatory Visit: Payer: Medicaid Other | Admitting: Obstetrics & Gynecology

## 2015-07-18 IMAGING — RF DG FLUORO GUIDE LUMBAR PUNCTURE
1 series · 1 of 1 positions shown · non-contrast
Comparison: none

CLINICAL DATA: Abnormal MRI brain question multiple sclerosis

[Series 1: run · 1 of 1 slices shown]
[im 1/1]
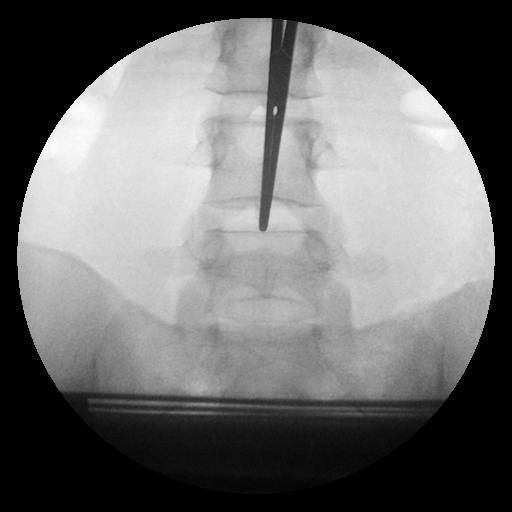

[1 of 1 positions shown; findings below may reference images not displayed]

EXAM:
DIAGNOSTIC LUMBAR PUNCTURE UNDER FLUOROSCOPIC GUIDANCE

FLUOROSCOPY TIME:  0 min 6 seconds

PROCEDURE:
Informed consent was obtained from the patient prior to the
procedure, including potential complications of headache, allergy,
and pain. L4-L5 disc space was localized under fluoroscopy. With the
patient prone, the lower back was prepped with Betadine. 1%
Lidocaine was used for local anesthesia, 2.0 mL total. Lumbar
puncture was performed at the L4-L5 level using a 22 gauge needle
with return of clear colorless CSF with an opening pressure of 9 cm
water (measured prone). 12 ml of CSF were obtained for laboratory
studies. Patient tolerated the procedure well without immediate
complication.
IMPRESSION: Fluoroscopic guided lumbar puncture as above.

## 2015-08-05 ENCOUNTER — Ambulatory Visit: Payer: Medicaid Other | Admitting: Adult Health

## 2015-08-31 ENCOUNTER — Telehealth: Payer: Self-pay | Admitting: *Deleted

## 2015-08-31 NOTE — Telephone Encounter (Signed)
Left message I called 

## 2015-11-06 ENCOUNTER — Encounter (HOSPITAL_COMMUNITY): Payer: Self-pay | Admitting: *Deleted

## 2015-11-06 ENCOUNTER — Emergency Department (HOSPITAL_COMMUNITY)
Admission: EM | Admit: 2015-11-06 | Discharge: 2015-11-06 | Disposition: A | Discharge status: Home / Self Care | Payer: Medicaid Other | Attending: Emergency Medicine | Admitting: Emergency Medicine

## 2015-11-06 ENCOUNTER — Emergency Department (HOSPITAL_COMMUNITY): Payer: Medicaid Other

## 2015-11-06 ENCOUNTER — Other Ambulatory Visit: Payer: Self-pay

## 2015-11-06 DIAGNOSIS — Z79899 Other long term (current) drug therapy: Secondary | ICD-10-CM | POA: Diagnosis not present

## 2015-11-06 DIAGNOSIS — F418 Other specified anxiety disorders: Secondary | ICD-10-CM | POA: Diagnosis not present

## 2015-11-06 DIAGNOSIS — Z791 Long term (current) use of non-steroidal anti-inflammatories (NSAID): Secondary | ICD-10-CM | POA: Diagnosis not present

## 2015-11-06 DIAGNOSIS — R072 Precordial pain: Secondary | ICD-10-CM | POA: Diagnosis present

## 2015-11-06 DIAGNOSIS — Z87891 Personal history of nicotine dependence: Secondary | ICD-10-CM | POA: Diagnosis not present

## 2015-11-06 DIAGNOSIS — R070 Pain in throat: Secondary | ICD-10-CM | POA: Insufficient documentation

## 2015-11-06 LAB — BASIC METABOLIC PANEL
Anion gap: 6 (ref 5–15)
BUN: 10 mg/dL (ref 6–20)
CHLORIDE: 110 mmol/L (ref 101–111)
CO2: 22 mmol/L (ref 22–32)
CREATININE: 0.75 mg/dL (ref 0.44–1.00)
Calcium: 9.1 mg/dL (ref 8.9–10.3)
GFR calc Af Amer: >60 mL/min (ref >60)
GFR calc non Af Amer: >60 mL/min (ref >60)
GLUCOSE: 119 mg/dL — AB (ref 65–99)
POTASSIUM: 3.8 mmol/L (ref 3.5–5.1)
SODIUM: 138 mmol/L (ref 135–145)

## 2015-11-06 LAB — CBC
HCT: 37.8 % (ref 36.0–46.0)
Hemoglobin: 13 g/dL (ref 12.0–15.0)
MCH: 29.1 pg (ref 26.0–34.0)
MCHC: 34.4 g/dL (ref 30.0–36.0)
MCV: 84.6 fL (ref 78.0–100.0)
PLATELETS: 241 10*3/uL (ref 150–400)
RBC: 4.47 MIL/uL (ref 3.87–5.11)
RDW: 12.8 % (ref 11.5–15.5)
WBC: 8.5 10*3/uL (ref 4.0–10.5)

## 2015-11-06 LAB — HEPATIC FUNCTION PANEL
ALK PHOS: 48 U/L (ref 38–126)
ALT: 17 U/L (ref 14–54)
AST: 18 U/L (ref 15–41)
Albumin: 4.3 g/dL (ref 3.5–5.0)
BILIRUBIN DIRECT: 0.2 mg/dL (ref 0.1–0.5)
BILIRUBIN TOTAL: 1.1 mg/dL (ref 0.3–1.2)
Indirect Bilirubin: 0.9 mg/dL (ref 0.3–0.9)
Total Protein: 6.8 g/dL (ref 6.5–8.1)

## 2015-11-06 LAB — RAPID STREP SCREEN (MED CTR MEBANE ONLY): STREPTOCOCCUS, GROUP A SCREEN (DIRECT): NEGATIVE

## 2015-11-06 LAB — D-DIMER, QUANTITATIVE (NOT AT ARMC): D DIMER QUANT: 0.46 ug{FEU}/mL (ref 0.00–0.50)

## 2015-11-06 LAB — TROPONIN I: Troponin I: <0.03 ng/mL (ref <0.031)

## 2015-11-06 MED ORDER — FLUCONAZOLE 100 MG PO TABS
200.0000 mg | ORAL_TABLET | Freq: Once | ORAL | Status: AC
  Administered 2015-11-06: 200 mg via ORAL
  Filled 2015-11-06: qty 2

## 2015-11-06 MED ORDER — FLUCONAZOLE 100 MG PO TABS: 100.0000 mg | ORAL_TABLET | Freq: Every day | ORAL | Status: DC

## 2015-11-06 NOTE — ED Notes (Signed)
Pt ambulated to restroom & returned to room w/ no complications. 

## 2015-11-06 NOTE — ED Notes (Signed)
Ok to eat per MD

## 2015-11-06 NOTE — ED Provider Notes (Signed)
CSN: [id number]     Arrival date & time 11/06/15  1649 History   First MD Initiated Contact with Patient 11/06/15 1723     Chief Complaint  Patient presents with  . Chest Pain     (Consider location/radiation/quality/duration/timing/severity/associated sxs/prior Treatment) Patient is a 25 y.o. female presenting with chest pain. The history is provided by the patient (Patient complains of pain in her upper chest when she swallows. She has a history of MS).  Chest Pain Pain location:  Substernal area Pain quality: aching   Pain radiates to:  Does not radiate Pain radiates to the back: no   Pain severity:  Moderate Onset quality:  Sudden Timing: Hurts only when she swallows. Chronicity:  New Context: breathing   Associated symptoms: no abdominal pain, no back pain, no cough, no fatigue and no headache     Past Medical History  Diagnosis Date  . Heart murmur   . Hx of chlamydia infection   . Abnormal Pap smear   . HSV-2 (herpes simplex virus 2) infection   . Pregnant   . Mental disorder     depression,postpartum  . Postpartum depression 04/03/2013  . Right foot drop 04/03/2013    Has - babinski, some muscle weakness  Noted when trying to curl toes  . Multiple sclerosis (HCC)   . Optic neuritis     Right  . UTI (lower urinary tract infection) 09/22/2013  . Borderline personality disorder   . Depression with anxiety   . PTSD (post-traumatic stress disorder)    Past Surgical History  Procedure Laterality Date  . Wisdom tooth extraction  2012  . Cesarean section N/A 01/27/2013    Procedure: CESAREAN SECTION;  Surgeon: Catalina Antigua, MD;  Location: [facility];  Service: Obstetrics;  Laterality: N/A;   Family History  Problem Relation Age of Onset  . Cancer Paternal Grandmother     breast  . Anxiety disorder Mother   . Heart disease Mother   . Stroke Paternal Aunt    Social History  Substance Use Topics  . Smoking status: Former Smoker    Types: Cigarettes    Quit date:  12/14/2010  . Smokeless tobacco: Never Used  . Alcohol Use: No   OB History    Gravida Para Term Preterm AB TAB SAB Ectopic Multiple Living   2 2 1 1      2      Review of Systems  Constitutional: Negative for appetite change and fatigue.  HENT: Negative for congestion, ear discharge and sinus pressure.   Eyes: Negative for discharge.  Respiratory: Negative for cough.   Cardiovascular: Positive for chest pain.  Gastrointestinal: Negative for abdominal pain and diarrhea.       Pain with swallowing  Genitourinary: Negative for frequency and hematuria.  Musculoskeletal: Negative for back pain.  Skin: Negative for rash.  Neurological: Negative for seizures and headaches.  Psychiatric/Behavioral: Negative for hallucinations.      Allergies  Penicillins  Home Medications   Prior to Admission medications   Medication Sig Start Date End Date Taking? Authorizing Provider  acetaminophen (TYLENOL) 500 MG tablet Take 1,000 mg by mouth every 8 (eight) hours as needed for mild pain or moderate pain.    Yes Historical Provider, MD  citalopram (CELEXA) 10 MG tablet Take 10 mg by mouth daily.   Yes Historical Provider, MD  ibuprofen (ADVIL,MOTRIN) 200 MG tablet Take 200 mg by mouth every 6 (six) hours as needed for mild pain or moderate pain.  Yes Historical Provider, MD  Multiple Vitamins-Minerals (MULTIVITAMIN WOMEN) TABS Take 2 tablets by mouth daily.   Yes Historical Provider, MD  fluconazole (DIFLUCAN) 100 MG tablet Take 1 tablet (100 mg total) by mouth daily. 11/06/15   Bethann Berkshire, MD   BP 121/65 mmHg  Pulse 80  Temp(Src) 98 F (36.7 C) (Oral)  Resp 15  Ht  (1.549 m)  Wt 122 lb 6.4 oz (55.52 kg)  BMI 23.14 kg/m2  SpO2 97%  LMP 10/10/2015 Physical Exam  Constitutional: She is oriented to person, place, and time. She appears well-developed.  HENT:  Head: Normocephalic.  Eyes: Conjunctivae and EOM are normal. No scleral icterus.  Neck: Neck supple. No thyromegaly  present.  Cardiovascular: Normal rate and regular rhythm.  Exam reveals no gallop and no friction rub.   No murmur heard. Pulmonary/Chest: No stridor. She has no wheezes. She has no rales. She exhibits no tenderness.  Abdominal: She exhibits no distension. There is no tenderness. There is no rebound.  Musculoskeletal: Normal range of motion. She exhibits no edema.  Lymphadenopathy:    She has no cervical adenopathy.  Neurological: She is oriented to person, place, and time. She exhibits normal muscle tone. Coordination normal.  Skin: No rash noted. No erythema.  Psychiatric: She has a normal mood and affect. Her behavior is normal.    ED Course  Procedures (including critical care time) Labs Review Labs Reviewed  BASIC METABOLIC PANEL - Abnormal; Notable for the following:    Glucose, Bld 119 (*)    All other components within normal limits  RAPID STREP SCREEN (NOT AT [facility])  CULTURE, GROUP A STREP [facility])  CBC  TROPONIN I  HEPATIC FUNCTION PANEL  D-DIMER, QUANTITATIVE (NOT AT [facility])    Imaging Review Dg Chest 2 View  11/06/2015  CLINICAL DATA:  Chest pain since yesterday, pain on deep inspiration EXAM: CHEST  2 VIEW COMPARISON:  None. FINDINGS: Normal mediastinum and cardiac silhouette. Normal pulmonary vasculature. No evidence of effusion, infiltrate, or pneumothorax. No acute bony abnormality. IMPRESSION: Normal chest radiograph Electronically Signed   By: Genevive Bi M.D.   On: 11/06/2015 17:18   I have personally reviewed and evaluated these images and lab results as part of my medical decision-making.   EKG Interpretation None      MDM   Final diagnoses:  Throat pain in adult    Labs chest x-ray EKG unremarkable. Patient continues have pain when she swallows. I spoke with Dr. Darrick Penna who suggested starting Diflucan. It feels stated that MS should not be causing this problem. She is going to see the patient this week and possibly do an EGD    Bethann Berkshire,  MD 11/06/15 2033

## 2015-11-06 NOTE — ED Notes (Signed)
Pt alert & oriented x4, stable gait. Patient given discharge instructions, paperwork & prescription(s). Patient  instructed to stop at the registration desk to finish any additional paperwork. Patient verbalized understanding. Pt left department w/ no further questions. 

## 2015-11-06 NOTE — ED Notes (Signed)
Pt comes in for chest pain that moves into her throat. This started last night. Pt states her pain increases with inspiration, swallowing, and movement.

## 2015-11-06 NOTE — Discharge Instructions (Signed)
Call Dr. Darrick Penna office on Monday for appointment this week and possible EGD. Take only soft foods and drink plenty of fluids

## 2015-11-07 ENCOUNTER — Telehealth: Payer: Self-pay | Admitting: Gastroenterology

## 2015-11-07 NOTE — Telephone Encounter (Signed)
PT NEEDS APPT ASAP DX: ODYNOPHAGIA WITHIN 7 DAYS.

## 2015-11-08 NOTE — Telephone Encounter (Signed)
APPT MADE FOR 11/09/15 AND PATIENT AWARE OF DATE AND TIME

## 2015-11-09 ENCOUNTER — Ambulatory Visit: Payer: Medicaid Other | Admitting: Nurse Practitioner

## 2015-11-09 ENCOUNTER — Encounter: Payer: Self-pay | Admitting: Nurse Practitioner

## 2015-11-09 ENCOUNTER — Telehealth: Payer: Self-pay | Admitting: Nurse Practitioner

## 2015-11-09 NOTE — Telephone Encounter (Signed)
PATIENT WAS A NO SHOW AND LETTER SENT  °

## 2015-11-09 NOTE — Telephone Encounter (Signed)
Noted  

## 2015-11-10 LAB — CULTURE, GROUP A STREP (THRC)

## 2016-01-11 ENCOUNTER — Emergency Department (HOSPITAL_COMMUNITY)
Admission: EM | Admit: 2016-01-11 | Discharge: 2016-01-11 | Disposition: A | Discharge status: Home / Self Care | Payer: Medicaid Other | Attending: Emergency Medicine | Admitting: Emergency Medicine

## 2016-01-11 ENCOUNTER — Emergency Department (HOSPITAL_COMMUNITY): Payer: Medicaid Other

## 2016-01-11 ENCOUNTER — Encounter (HOSPITAL_COMMUNITY): Payer: Self-pay | Admitting: Emergency Medicine

## 2016-01-11 DIAGNOSIS — Z87891 Personal history of nicotine dependence: Secondary | ICD-10-CM | POA: Diagnosis not present

## 2016-01-11 DIAGNOSIS — G35 Multiple sclerosis: Secondary | ICD-10-CM | POA: Diagnosis not present

## 2016-01-11 DIAGNOSIS — R0602 Shortness of breath: Secondary | ICD-10-CM | POA: Insufficient documentation

## 2016-01-11 DIAGNOSIS — Z791 Long term (current) use of non-steroidal anti-inflammatories (NSAID): Secondary | ICD-10-CM | POA: Diagnosis not present

## 2016-01-11 LAB — URINALYSIS, ROUTINE W REFLEX MICROSCOPIC
BILIRUBIN URINE: NEGATIVE
GLUCOSE, UA: NEGATIVE mg/dL
HGB URINE DIPSTICK: NEGATIVE
KETONES UR: NEGATIVE mg/dL
Leukocytes, UA: NEGATIVE
Nitrite: NEGATIVE
PROTEIN: NEGATIVE mg/dL
Specific Gravity, Urine: 1.01 (ref 1.005–1.030)
pH: 6 (ref 5.0–8.0)

## 2016-01-11 LAB — TROPONIN I

## 2016-01-11 LAB — BASIC METABOLIC PANEL
ANION GAP: 7 (ref 5–15)
BUN: 17 mg/dL (ref 6–20)
CALCIUM: 8.5 mg/dL — AB (ref 8.9–10.3)
CO2: 18 mmol/L — ABNORMAL LOW (ref 22–32)
CREATININE: 0.89 mg/dL (ref 0.44–1.00)
Chloride: 112 mmol/L — ABNORMAL HIGH (ref 101–111)
Glucose, Bld: 107 mg/dL — ABNORMAL HIGH (ref 65–99)
Potassium: 3.5 mmol/L (ref 3.5–5.1)
SODIUM: 137 mmol/L (ref 135–145)

## 2016-01-11 LAB — CBC WITH DIFFERENTIAL/PLATELET
BASOS ABS: 0 10*3/uL (ref 0.0–0.1)
BASOS PCT: 0 %
EOS ABS: 0.1 10*3/uL (ref 0.0–0.7)
Eosinophils Relative: 1 %
HCT: 36.7 % (ref 36.0–46.0)
Hemoglobin: 12.7 g/dL (ref 12.0–15.0)
Lymphocytes Relative: 33 %
Lymphs Abs: 3.9 10*3/uL (ref 0.7–4.0)
MCH: 29.6 pg (ref 26.0–34.0)
MCHC: 34.6 g/dL (ref 30.0–36.0)
MCV: 85.5 fL (ref 78.0–100.0)
MONO ABS: 0.8 10*3/uL (ref 0.1–1.0)
MONOS PCT: 7 %
NEUTROS ABS: 7.2 10*3/uL (ref 1.7–7.7)
NEUTROS PCT: 59 %
Platelets: 355 10*3/uL (ref 150–400)
RBC: 4.29 MIL/uL (ref 3.87–5.11)
RDW: 13.4 % (ref 11.5–15.5)
WBC: 12.1 10*3/uL — ABNORMAL HIGH (ref 4.0–10.5)

## 2016-01-11 LAB — D-DIMER, QUANTITATIVE (NOT AT ARMC): D DIMER QUANT: 0.37 ug{FEU}/mL (ref 0.00–0.50)

## 2016-01-11 NOTE — ED Notes (Signed)
Istat beta HCG < 5 unable to transmit data at this time

## 2016-01-11 NOTE — ED Provider Notes (Addendum)
TIME SEEN: 3:00 AM  CHIEF COMPLAINT: Shortness of breath  HPI: Pt is a 25 y.o. female with history of multiple sclerosis currently not on any medications who is followed by Dr. Renne Crigler who presents to the emergency department 2 months of feeling short of breath. States she has right-sided chest pain with deep inspiration. States she has been feeling lightheaded recently. No fevers or cough. No lower extremity swelling or pain. States that she tested her heart rate and pulse ox using an app on her phone today. It stated that her oxygen level was 86%. She called her mother who told her she should go to the emergency department. She called the triage nurse who instructed she called 911. She does not wear oxygen at home. No history of PE or DVT.  No history of ACS. Does report a family history of ACS.  No new numbness or focal weakness. No vision changes. States she feels weak all over.  ROS: See HPI Constitutional: no fever  Eyes: no drainage  ENT: no runny nose   Cardiovascular:   chest pain  Resp:  SOB  GI: no vomiting GU: no dysuria Integumentary: no rash  Allergy: no hives  Musculoskeletal: no leg swelling  Neurological: no slurred speech ROS otherwise negative  PAST MEDICAL HISTORY/PAST SURGICAL HISTORY:  Past Medical History:  Diagnosis Date  . Abnormal Pap smear   . Borderline personality disorder   . Depression with anxiety   . Heart murmur   . HSV-2 (herpes simplex virus 2) infection   . Hx of chlamydia infection   . Mental disorder    depression,postpartum  . Multiple sclerosis (HCC)   . Optic neuritis    Right  . Postpartum depression 04/03/2013  . Pregnant   . PTSD (post-traumatic stress disorder)   . Right foot drop 04/03/2013   Has - babinski, some muscle weakness  Noted when trying to curl toes  . UTI (lower urinary tract infection) 09/22/2013    MEDICATIONS:  Prior to Admission medications   Medication Sig Start Date End Date Taking? Authorizing Provider   acetaminophen (TYLENOL) 500 MG tablet Take 1,000 mg by mouth every 8 (eight) hours as needed for mild pain or moderate pain.    Yes Historical Provider, MD  ibuprofen (ADVIL,MOTRIN) 200 MG tablet Take 200 mg by mouth every 6 (six) hours as needed for mild pain or moderate pain.   Yes Historical Provider, MD  Multiple Vitamins-Minerals (MULTIVITAMIN WOMEN) TABS Take 2 tablets by mouth daily.   Yes Historical Provider, MD  citalopram (CELEXA) 10 MG tablet Take 10 mg by mouth daily.    Historical Provider, MD  fluconazole (DIFLUCAN) 100 MG tablet Take 1 tablet (100 mg total) by mouth daily. 11/06/15   Bethann Berkshire, MD    ALLERGIES:  Allergies  Allergen Reactions  . Penicillins Other (See Comments)    Unknown childhood reaction    SOCIAL HISTORY:  Social History  Substance Use Topics  . Smoking status: Former Smoker    Types: Cigarettes    Quit date: 12/14/2010  . Smokeless tobacco: Never Used  . Alcohol use No    FAMILY HISTORY: Family History  Problem Relation Age of Onset  . Cancer Paternal Grandmother     breast  . Anxiety disorder Mother   . Heart disease Mother   . Stroke Paternal Aunt     EXAM: BP 131/90 (BP Location: Right Arm)   Pulse 91   Temp 97.7 F (36.5 C) (Oral)   Resp  18   Ht 5\' 1"  (1.549 m)   Wt 123 lb (55.8 kg)   LMP 01/09/2016   SpO2 100%   BMI 23.24 kg/m  CONSTITUTIONAL: Alert and oriented and responds appropriately to questions. Well-appearing; well-nourished, Appears very anxious, afebrile and nontoxic HEAD: Normocephalic EYES: Conjunctivae clear, PERRL ENT: normal nose; no rhinorrhea; moist mucous membranes NECK: Supple, no meningismus, no LAD  CARD: RRR; S1 and S2 appreciated; no murmurs, no clicks, no rubs, no gallops RESP: Normal chest excursion without splinting or tachypnea; breath sounds clear and equal bilaterally; no wheezes, no rhonchi, no rales, no hypoxia or respiratory distress, speaking full sentences, Patient appears to be  repeatedly involuntarily taking large deep breaths ABD/GI: Normal bowel sounds; non-distended; soft, non-tender, no rebound, no guarding, no peritoneal signs BACK:  The back appears normal and is non-tender to palpation, there is no CVA tenderness EXT: Normal ROM in all joints; non-tender to palpation; no edema; normal capillary refill; no cyanosis, no calf tenderness or swelling    SKIN: Normal color for age and race; warm; no rash NEURO: Moves all extremities equally, sensation to light touch intact diffusely, cranial nerves II through XII intact, normal gait PSYCH: The patient's mood and manner are appropriate. Grooming and personal hygiene are appropriate.  Patient does appear anxious.  MEDICAL DECISION MAKING: Patient here shortness of breath for the past 2 months. She is hemodynamically stable. Her oxygen saturation is 100% on room air in the emergency department. I suspect that there is a large component of anxiety contributing to her symptoms. She states that her symptoms started after starting Celexa but she has been off of this medication for the past 2 weeks. Low suspicion for ACS but will obtain EKG, troponin. We'll also obtain a d-dimer as I feel she is low risk for PE. We'll also obtain a chest x-ray. Discussed with patient if her workup today is negative and her vital signs remained normal that she will be discharged and I recommended close outpatient follow-up with her neurologist as well as her primary care provider. She is comfortable with this plan. Discussed with her that this could be from muscle weakness from her multiple sclerosis. Have recommended close follow-up with her neurologist. We will also a bili patient in the emergency department with pulse oximetry.  ED PROGRESS: Patient's EKG is completely normal. Chest x-ray is clear. She has been able to ambulate several times around the nurse's station and her oxygen saturation stays 100%.  Her labs show negative troponin and negative  d-dimer.  She does have a mild white count of 12.1 without left shift. She does have a slightly low bicarbonate of 18 but normal anion gap. This may be due to her hyperventilation. Chest x-ray shows no pneumonia. Urine shows no sign of infection. She denies any recent fever. Have a suspicion that this is related to anxiety versus her multiple sclerosis. I do not feel that she has an acute exacerbation that would improve with IV steroids and admission to the hospital. I feel this is more chronic in nature and needs outpatient neurology follow-up.  Have discussed this at length with patient and her husband at the bedside and they are comfortable with this plan. I do not feel this time she needs further emergent workup.   At this time, I do not feel there is any life-threatening condition present. I have reviewed and discussed all results (EKG, imaging, lab, urine as appropriate), exam findings with patient/family. I have reviewed nursing notes and appropriate  previous records.  I feel the patient is safe to be discharged home without further emergent workup and can continue workup as an outpatient as needed. Discussed usual and customary return precautions. Patient/family verbalize understanding and are comfortable with this plan.  Outpatient follow-up has been provided. All questions have been answered.      EKG Interpretation  Date/Time:  Tuesday January 11 2016 03:32:26 EDT Ventricular Rate:  82 PR Interval:    QRS Duration: 88 QT Interval:  394 QTC Calculation: 461 R Axis:   55 Text Interpretation:  Sinus rhythm No significant change since last tracing Confirmed by WARD,  DO, KRISTEN [phone]([id number]) on 01/11/2016 3:36:35 AM         Layla MawKristen N Ward, DO 01/11/16 0443    5:00 AM  Pt's Husband at bedside now reports that he thinks that this is anxiety and reports that she has had lots of anxiety over the past several months. She has a psychiatrist. She states that benzodiazepines have helped her in  the past but we have discussed that this is not a long-term solution. States she was unable to tolerate Celexa and is worried about trying other medications. She has no SI or HI. However sure to follow-up with her psychiatrist as I do think anxiety is concerning to her symptoms.  It also appears her neurologist recommended Tysabri for her multiple sclerosis but she states she did research online and was concerned about the potential side effects and decided not to start this medication. Have again advised her to follow-up with her neurologist for further management. We'll also discharge with a list of primary care providers.   Layla MawKristen N Ward, DO 01/11/16 [phone]

## 2016-01-11 NOTE — ED Notes (Addendum)
Ambulated patient around the nurses station twice. Sats stayed at 100% patient was breathing heavy when she got back to the room.

## 2016-01-11 NOTE — ED Triage Notes (Signed)
Pt states she can not take a deep breath that satisfies her x 2 months. Pt states when she takes a deep breath it is orgasmic.  Pt states it causes pain in the rt upper abd to take a deep breath.

## 2016-02-09 ENCOUNTER — Ambulatory Visit (INDEPENDENT_AMBULATORY_CARE_PROVIDER_SITE_OTHER): Payer: Medicaid Other | Admitting: Family Medicine

## 2016-02-09 ENCOUNTER — Encounter: Payer: Self-pay | Admitting: Family Medicine

## 2016-02-09 VITALS — BP 116/72 | HR 86 | Resp 18 | Wt 123.0 lb

## 2016-02-09 DIAGNOSIS — F4323 Adjustment disorder with mixed anxiety and depressed mood: Secondary | ICD-10-CM

## 2016-02-09 DIAGNOSIS — G35 Multiple sclerosis: Secondary | ICD-10-CM | POA: Diagnosis not present

## 2016-02-09 DIAGNOSIS — Z7189 Other specified counseling: Secondary | ICD-10-CM | POA: Diagnosis not present

## 2016-02-09 DIAGNOSIS — N946 Dysmenorrhea, unspecified: Secondary | ICD-10-CM | POA: Insufficient documentation

## 2016-02-09 DIAGNOSIS — K219 Gastro-esophageal reflux disease without esophagitis: Secondary | ICD-10-CM

## 2016-02-09 DIAGNOSIS — Z7689 Persons encountering health services in other specified circumstances: Secondary | ICD-10-CM

## 2016-02-09 DIAGNOSIS — Z23 Encounter for immunization: Secondary | ICD-10-CM

## 2016-02-09 NOTE — Progress Notes (Signed)
Chief Complaint  Patient presents with  . Establish Care    previously seen at [facility]    25 year old with MS Under care of neurologist at [facility], Dr Abott/Dr Renne Crigler.  Not seen since ept 2016, overdue for followup.  Not currently on any treatment, refuses tysabri due to concern over side effects.  Has been treated successfully to date with steroids during flares. History of depression and anxiety.  under care mental health through [facility] with regular counseling visits.  Has diagnoses in chart of PTSD and personality disorder. States has panic attacks.  Is on celexa 5 mg a day.  Had an ER visit in June for trouble swallowing.  Still occurs at times.  She says she has been told she has GERD.  Sometimes with reflux symptoms.  She was scheduled to see Dr Darrick Penna in GI but missed appt.  This is re scheduled. She has a history of no shows. Discharged from neurology for this.  I told her I will do the same, and that I expect a call if she is unable to make an appt for any reason. She eats well.  Just "ran a 5k for cancer"  Complains of the sensation that she cannot take a deep breath.  Went to the ER for this earlier this month.  EKG and CXR and labs normal.  Thinks it might be her heart.  Her husband states she needs additional evaluation for this. I disagree, and try to reassure them she has no heart risk factors, normal exam and EKG AND can run a 5k.  Her heart is fine.  They both seem anxious about a number of ongoing vague complanits, with concern that it is a serious problem, or complication from her MS   I think her anxiety plays into this.   Patient Active Problem List   Diagnosis Date Noted  . Adjustment disorder with mixed anxiety and depressed mood 02/09/2016  . Dysmenorrhea 02/09/2016  . Esophageal reflux 02/09/2016  . Optic neuritis 08/15/2013  . Multiple sclerosis (HCC) 05/02/2013    Outpatient Encounter Prescriptions as of 02/09/2016  Medication Sig  . Multiple  Vitamins-Minerals (MULTIVITAMIN WOMEN) TABS Take 2 tablets by mouth daily.  . citalopram (CELEXA) 10 MG tablet Take 5 mg by mouth daily.   No facility-administered encounter medications on file as of 02/09/2016.     Past Medical History:  Diagnosis Date  . Abnormal Pap smear   . Allergy    mild  . Anemia    post partum  . Anxiety   . Borderline personality disorder   . Depression with anxiety   . GERD (gastroesophageal reflux disease)   . Heart murmur    as a baby  . HSV-2 (herpes simplex virus 2) infection   . HSV-2 (herpes simplex virus 2) infection    Suppress @ 34wks:______________   . Hx of chlamydia infection   . Hypertension    Only when pregnant  . Mental disorder    depression,postpartum  . Multiple sclerosis (HCC)   . Optic neuritis    Right  . Postpartum depression 04/03/2013  . Pregnant   . PTSD (post-traumatic stress disorder)   . Right foot drop 04/03/2013   Has - babinski, some muscle weakness  Noted when trying to curl toes  . UTI (lower urinary tract infection) 09/22/2013    Past Surgical History:  Procedure Laterality Date  . CESAREAN SECTION N/A 01/27/2013   Procedure: CESAREAN SECTION;  Surgeon:  Catalina AntiguaPeggy Constant, MD;  Location: [facility];  Service: Obstetrics;  Laterality: N/A;  . WISDOM TOOTH EXTRACTION  2012    Social History   Social History  . Marital status: Single    Spouse name: N/A  . Number of children: 2  . Years of education: GED+   Occupational History  .      unemployed  .      disability   Social History Main Topics  . Smoking status: Former Smoker    Types: Cigarettes    Quit date: 12/14/2010  . Smokeless tobacco: Never Used  . Alcohol use No  . Drug use: No  . Sexual activity: Yes    Birth control/ protection: None     Comment: natural   Other Topics Concern  . Not on file   Social History Narrative   Pt lives home with husband (Will Blanchardross) and two children   Husband unemployed, considers himself her "caretaker"    Pt is right handed   Education GED   Pt may consume one cup of caffeine on a daily basis    Family History  Problem Relation Age of Onset  . Cancer Paternal Grandmother     breast  . Anxiety disorder Mother   . Heart disease Mother   . Stroke Paternal Aunt   . Cancer Paternal Grandfather     lung  . Stroke Maternal Grandfather     brain aneurysm    Review of Systems  Constitutional: Negative for chills, fever, malaise/fatigue and weight loss.  HENT: Negative for congestion and hearing loss.        Occas difficulty swallowing  Eyes: Negative for blurred vision and pain.       History of blurred vision with flares  Respiratory: Negative for cough and shortness of breath.        Sensation that she cannot take a deep breath  Cardiovascular: Negative for chest pain and leg swelling.       Chest wall pain with deep breath  Gastrointestinal: Positive for heartburn. Negative for abdominal pain, constipation and diarrhea.  Genitourinary: Negative for dysuria and frequency.  Musculoskeletal: Negative for falls, joint pain and myalgias.  Neurological: Negative for dizziness, seizures and headaches.  Psychiatric/Behavioral: Negative for depression. The patient is nervous/anxious. The patient does not have insomnia.     BP 116/72   Pulse 86   Resp 18   Wt 123 lb (55.8 kg)   LMP 01/09/2016   SpO2 99%   BMI 23.24 kg/m   Physical Exam  Constitutional: She is oriented to person, place, and time. She appears well-developed and well-nourished. No distress.  HENT:  Head: Normocephalic and atraumatic.  Right Ear: External ear normal.  Left Ear: External ear normal.  Mouth/Throat: Oropharynx is clear and moist.  Eyes: Conjunctivae are normal. Pupils are equal, round, and reactive to light.  Neck: Normal range of motion. Neck supple. No thyromegaly present.  Cardiovascular: Normal rate, regular rhythm and normal heart sounds.   No murmur heard. Pulmonary/Chest: Effort normal and  breath sounds normal. No respiratory distress. She exhibits no tenderness.  Abdominal: Soft. Bowel sounds are normal. There is no hepatosplenomegaly. There is no tenderness.  Musculoskeletal: Normal range of motion. She exhibits no edema.  Lymphadenopathy:    She has no cervical adenopathy.  Neurological: She is alert and oriented to person, place, and time. She has normal reflexes. She displays normal reflexes. Coordination normal.  Gait normal  Skin: Skin is warm and dry.  Psychiatric: Thought content normal. Her mood appears anxious. Her speech is rapid and/or pressured. She expresses impulsivity.  Immature.  Giggles.  Rapid speech and then looses train of thought.  Nursing note and vitals reviewed.  ASSESSMENT/PLAN:  1. Encounter to establish care with new doctor   2. Adjustment disorder with mixed anxiety and depressed mood   3. MS (multiple sclerosis) (HCC)   4. Gastroesophageal reflux disease, esophagitis presence not specified  - Ambulatory referral to Gastroenterology  5. Dysmenorrhea    Patient Instructions  Need to call your GYN to get in for check up  Need to see if you can get in with your neurologist prior to 2018  Continue to eat well and stay active  See me yearly for regular check ups Call or e mail if you need help sooner   Eustace Moore, MD

## 2016-02-09 NOTE — Patient Instructions (Signed)
Need to call your GYN to get in for check up  Need to see if you can get in with your neurologist prior to 2018  Continue to eat well and stay active  See me yearly for regular check ups Call or e mail if you need help sooner

## 2016-02-15 ENCOUNTER — Encounter: Payer: Self-pay | Admitting: Gastroenterology

## 2016-02-21 ENCOUNTER — Other Ambulatory Visit (HOSPITAL_COMMUNITY)
Admission: RE | Admit: 2016-02-21 | Discharge: 2016-02-21 | Disposition: A | Discharge status: Home / Self Care | Payer: Medicaid Other | Source: Ambulatory Visit | Attending: Adult Health | Admitting: Adult Health

## 2016-02-21 ENCOUNTER — Encounter: Payer: Self-pay | Admitting: Adult Health

## 2016-02-21 ENCOUNTER — Ambulatory Visit (INDEPENDENT_AMBULATORY_CARE_PROVIDER_SITE_OTHER): Payer: Medicaid Other | Admitting: Adult Health

## 2016-02-21 VITALS — BP 128/90 | HR 66 | Ht 61.0 in | Wt 125.0 lb

## 2016-02-21 DIAGNOSIS — Z01411 Encounter for gynecological examination (general) (routine) with abnormal findings: Secondary | ICD-10-CM | POA: Diagnosis not present

## 2016-02-21 DIAGNOSIS — F32A Depression, unspecified: Secondary | ICD-10-CM

## 2016-02-21 DIAGNOSIS — Z01419 Encounter for gynecological examination (general) (routine) without abnormal findings: Secondary | ICD-10-CM | POA: Diagnosis not present

## 2016-02-21 DIAGNOSIS — Z113 Encounter for screening for infections with a predominantly sexual mode of transmission: Secondary | ICD-10-CM | POA: Diagnosis present

## 2016-02-21 DIAGNOSIS — N941 Unspecified dyspareunia: Secondary | ICD-10-CM

## 2016-02-21 DIAGNOSIS — Z124 Encounter for screening for malignant neoplasm of cervix: Secondary | ICD-10-CM

## 2016-02-21 DIAGNOSIS — Z0000 Encounter for general adult medical examination without abnormal findings: Secondary | ICD-10-CM

## 2016-02-21 DIAGNOSIS — F329 Major depressive disorder, single episode, unspecified: Secondary | ICD-10-CM

## 2016-02-21 NOTE — Progress Notes (Signed)
Patient ID: Haley HartiganHeidi L [name], female   DOB: 1990-07-21, 25 y.o.   MRN: [id number] History of Present Illness: Haley BucklerHeidi is a 25 year old white female, in for a well woman gyn exam and pap.She has pain with sex and with periods at times.She says she was recently treated at [facility] for MS flare,and says left side feels numb,she is not on birth control, using ovulation app.She is being seen at [facility] for [facility]. PCP Haley [name].   Current Medications, Allergies, Past Medical History, Past Surgical History, Family History and Social History were reviewed in [facility] electronic medical record.     Review of Systems: Patient denies any headaches, hearing loss, fatigue, blurred vision, shortness of breath, chest pain, abdominal pain, problems with bowel movements, urination. No joint pain or mood swings. See HPI for positives    Physical Exam:BP 128/90 (BP Location: Left Arm, Patient Position: Sitting, Cuff Size: Normal)   Pulse 66   Ht 5\' 1"  (1.549 m)   Wt 125 lb (56.7 kg)   LMP 02/03/2016 (Exact Date)   BMI 23.62 kg/m  General:  Well developed, well nourished, no acute distress Skin:  Warm and dry Neck:  Midline trachea, normal thyroid, good ROM, no lymphadenopathy Lungs; Clear to auscultation bilaterally Breast:  No dominant palpable mass, retraction, or nipple discharge Cardiovascular: Regular rate and rhythm Abdomen:  Soft, non tender, no hepatosplenomegaly Pelvic:  External genitalia is normal in appearance, no lesions.  The vagina is normal in appearance. Urethra has no lesions or masses. The cervix is bulbous,pap with GC/CHL performed.  Uterus is felt to be normal size, shape, and contour.  No adnexal masses or tenderness noted.Bladder is non tender, no masses felt. Extremities/musculoskeletal:  No swelling or varicosities noted, no clubbing or cyanosis Psych:  No mood changes, alert and cooperative,seems happy PHQ 9 score 24, is on celexa and sees [facility].  Impression:  1. Encounter for routine gynecological examination with Papanicolaou smear of cervix   2. Routine cervical smear   3. Depression, unspecified depression type   4. Dyspareunia, female      Plan: Check CBC,CMP,TSH and lipids,A1c and vitamin D Change positions with sex and use good lubricate,if continues may get US  Physical in 1 year, pap in 3 if normal  Follow up with [facility]

## 2016-02-21 NOTE — Patient Instructions (Addendum)
Physical in 1 year, pap in 3 years Change positions with sex F/U with [facility]

## 2016-02-22 ENCOUNTER — Encounter: Payer: Self-pay | Admitting: Adult Health

## 2016-02-22 DIAGNOSIS — E559 Vitamin D deficiency, unspecified: Secondary | ICD-10-CM

## 2016-02-22 HISTORY — DX: Vitamin D deficiency, unspecified: E55.9

## 2016-02-22 LAB — TSH: TSH: 1.84 u[IU]/mL (ref 0.450–4.500)

## 2016-02-22 LAB — CBC
Hematocrit: 37.4 % (ref 34.0–46.6)
Hemoglobin: 12.9 g/dL (ref 11.1–15.9)
MCH: 29.3 pg (ref 26.6–33.0)
MCHC: 34.5 g/dL (ref 31.5–35.7)
MCV: 85 fL (ref 79–97)
PLATELETS: 400 10*3/uL — AB (ref 150–379)
RBC: 4.41 x10E6/uL (ref 3.77–5.28)
RDW: 13.1 % (ref 12.3–15.4)
WBC: 25.8 10*3/uL (ref 3.4–10.8)

## 2016-02-22 LAB — LIPID PANEL
CHOL/HDL RATIO: 1.8 ratio (ref 0.0–4.4)
Cholesterol, Total: 153 mg/dL (ref 100–199)
HDL: 84 mg/dL (ref >39)
LDL Calculated: 54 mg/dL (ref 0–99)
TRIGLYCERIDES: 73 mg/dL (ref 0–149)
VLDL Cholesterol Cal: 15 mg/dL (ref 5–40)

## 2016-02-22 LAB — COMPREHENSIVE METABOLIC PANEL
A/G RATIO: 1.9 (ref 1.2–2.2)
ALK PHOS: 59 IU/L (ref 39–117)
ALT: 14 IU/L (ref 0–32)
AST: 12 IU/L (ref 0–40)
Albumin: 4.9 g/dL (ref 3.5–5.5)
BILIRUBIN TOTAL: 0.4 mg/dL (ref 0.0–1.2)
BUN/Creatinine Ratio: 24 — ABNORMAL HIGH (ref 9–23)
BUN: 20 mg/dL (ref 6–20)
CHLORIDE: 105 mmol/L (ref 96–106)
CO2: 19 mmol/L (ref 18–29)
Calcium: 10.1 mg/dL (ref 8.7–10.2)
Creatinine, Ser: 0.84 mg/dL (ref 0.57–1.00)
GFR calc non Af Amer: 96 mL/min/{1.73_m2} (ref >59)
GFR, EST AFRICAN AMERICAN: 111 mL/min/{1.73_m2} (ref >59)
Globulin, Total: 2.6 g/dL (ref 1.5–4.5)
Glucose: 98 mg/dL (ref 65–99)
POTASSIUM: 4.9 mmol/L (ref 3.5–5.2)
Sodium: 143 mmol/L (ref 134–144)
TOTAL PROTEIN: 7.5 g/dL (ref 6.0–8.5)

## 2016-02-22 LAB — VITAMIN D 25 HYDROXY (VIT D DEFICIENCY, FRACTURES): VIT D 25 HYDROXY: 22 ng/mL — AB (ref 30.0–100.0)

## 2016-02-22 LAB — HEMOGLOBIN A1C
Est. average glucose Bld gHb Est-mCnc: 103 mg/dL
Hgb A1c MFr Bld: 5.2 % (ref 4.8–5.6)

## 2016-02-23 LAB — CYTOLOGY - PAP

## 2016-02-25 ENCOUNTER — Telehealth: Payer: Self-pay | Admitting: Adult Health

## 2016-02-25 NOTE — Telephone Encounter (Signed)
Pt aware pap abnormal,needs colpo, appt made 

## 2016-03-03 ENCOUNTER — Encounter: Payer: Self-pay | Admitting: Family Medicine

## 2016-03-03 ENCOUNTER — Ambulatory Visit (INDEPENDENT_AMBULATORY_CARE_PROVIDER_SITE_OTHER): Payer: Medicaid Other | Admitting: Family Medicine

## 2016-03-03 VITALS — BP 104/68 | HR 72 | Temp 98.7°F | Resp 16 | Ht 61.0 in | Wt 124.0 lb

## 2016-03-03 DIAGNOSIS — J029 Acute pharyngitis, unspecified: Secondary | ICD-10-CM

## 2016-03-03 LAB — POCT RAPID STREP A (OFFICE): Rapid Strep A Screen: NEGATIVE

## 2016-03-03 NOTE — Patient Instructions (Addendum)

## 2016-03-03 NOTE — Progress Notes (Signed)
Subjective:     Haley HartiganHeidi L Vendetti is a 25 y.o. female who presents for evaluation of sore throat. Associated symptoms include headache and sore throat. Onset of symptoms was 1 day ago, and have been stable since that time. She is drinking plenty of fluids. She has had a recent close exposure to someone with proven streptococcal pharyngitis.  The following portions of the patient's history were reviewed and updated as appropriate: past family history, past social history, past surgical history and problem list.  Review of Systems Pertinent items are noted in HPI.    Objective:    BP 104/68 (BP Location: Right Arm, Patient Position: Sitting, Cuff Size: Normal)   Pulse 72   Temp 98.7 F (37.1 C) (Oral)   Resp 16   Ht 5\' 1"  (1.549 m)   Wt 124 lb 0.6 oz (56.3 kg)   LMP 03/03/2016   SpO2 98%   BMI 23.44 kg/m  General appearance: alert, fatigued and no distress Head: Normocephalic, without obvious abnormality, atraumatic Eyes: conjunctivae/corneas clear. PERRL, EOM's intact. Fundi benign. Ears: normal TM's and external ear canals both ears Nose: Nares normal. Septum midline. Mucosa normal. No drainage or sinus tenderness. Throat: abnormal findings: mild oropharyngeal erythema Neck: no adenopathy, supple, symmetrical, trachea midline and thyroid not enlarged, symmetric, no tenderness/mass/nodules Lungs: clear to auscultation bilaterally Heart: regular rate and rhythm, S1, S2 normal, no murmur, click, rub or gallop Abdomen: normal findings: bowel sounds normal and no organomegaly Skin: Skin color, texture, turgor normal. No rashes or lesions Lymph nodes: Cervical, supraclavicular, and axillary nodes normal.  Laboratory Strep test done. Results:negative.    Assessment:    Acute pharyngitis, likely  Viral pharyngitis.    Plan:    Use of OTC analgesics recommended as well as salt water gargles.

## 2016-03-06 ENCOUNTER — Encounter: Payer: Medicaid Other | Admitting: Obstetrics & Gynecology

## 2016-03-16 ENCOUNTER — Ambulatory Visit: Payer: Medicaid Other | Admitting: Gastroenterology

## 2016-03-16 NOTE — Progress Notes (Deleted)
   Subjective:    Patient ID: Haley [name], female    DOB: 10-28-1990, 25 y.o.   MRN: [id number]  HPI    Review of Systems     Objective:   Physical Exam        Assessment & Plan:

## 2016-03-20 ENCOUNTER — Encounter: Payer: Medicaid Other | Admitting: Obstetrics & Gynecology

## 2016-04-20 ENCOUNTER — Encounter: Payer: Medicaid Other | Admitting: Obstetrics & Gynecology

## 2016-05-01 ENCOUNTER — Encounter: Payer: Medicaid Other | Admitting: Obstetrics & Gynecology

## 2016-05-22 ENCOUNTER — Encounter: Payer: Medicaid Other | Admitting: Obstetrics & Gynecology

## 2016-05-30 ENCOUNTER — Encounter: Payer: Self-pay | Admitting: Adult Health

## 2016-05-31 ENCOUNTER — Encounter: Payer: Self-pay | Admitting: Obstetrics & Gynecology

## 2016-06-06 ENCOUNTER — Encounter: Payer: Medicaid Other | Admitting: Obstetrics & Gynecology

## 2016-06-13 ENCOUNTER — Telehealth: Payer: Self-pay | Admitting: *Deleted

## 2016-06-13 ENCOUNTER — Encounter: Payer: Medicaid Other | Admitting: Obstetrics & Gynecology

## 2016-06-13 NOTE — Telephone Encounter (Signed)
Pt called in to cx colpo for today.  She said that she had an abortion on Friday.  She almost passed out in the shower and is passing huge blood clots.  Advised pt to either come in here earlier than her scheduled appointment or to go to the ER.  Pt said that she was just going to f/u with the one who done the abortion today.  I informed her of the importance of being seen somewhere today.  06-13-16  AS

## 2016-06-22 ENCOUNTER — Encounter: Payer: Self-pay | Admitting: Adult Health

## 2016-06-28 ENCOUNTER — Ambulatory Visit (INDEPENDENT_AMBULATORY_CARE_PROVIDER_SITE_OTHER): Payer: Medicaid Other | Admitting: Adult Health

## 2016-06-28 ENCOUNTER — Encounter: Payer: Self-pay | Admitting: Adult Health

## 2016-06-28 VITALS — BP 100/80 | HR 90 | Ht 61.0 in | Wt 126.0 lb

## 2016-06-28 DIAGNOSIS — F32A Depression, unspecified: Secondary | ICD-10-CM

## 2016-06-28 DIAGNOSIS — F329 Major depressive disorder, single episode, unspecified: Secondary | ICD-10-CM | POA: Diagnosis not present

## 2016-06-28 DIAGNOSIS — N938 Other specified abnormal uterine and vaginal bleeding: Secondary | ICD-10-CM | POA: Diagnosis not present

## 2016-06-28 DIAGNOSIS — Z8759 Personal history of other complications of pregnancy, childbirth and the puerperium: Secondary | ICD-10-CM | POA: Insufficient documentation

## 2016-06-28 NOTE — Progress Notes (Addendum)
Subjective:     Patient ID: Haley [name], female   DOB: 03/09/1991, 26 y.o.   MRN: [id number]  HPI [name] is a 26 year old white female in for follow up after abortion.She took cytotec 1/25/a dn 1/26 and passed tissue and clots, bleeding now, ? Period.She was seen at [facility] in [place].    Review of Systems + bleeding after abortion Reviewed past medical,surgical, social and family history. Reviewed medications and allergies.     Objective:   Physical Exam BP 100/80 (BP Location: Left Arm, Patient Position: Sitting, Cuff Size: Normal)   Pulse 90   Ht 5\' 1"  (1.549 m)   Wt 126 lb (57.2 kg)   LMP 06/27/2016   BMI 23.81 kg/m    Skin warm and dry.Pelvic: external genitalia is normal in appearance no lesions, vagina: period like blood,urethra has no lesions or masses noted, cervix:smooth and bulbous, uterus: normal size, shape and contour, mildly tender, no masses felt, adnexa: no masses or tenderness noted. Bladder is non tender and no masses felt. PHQ 9 score 24, is on celexa and sees [facility]. Blood type is O+, in CHL.  Assessment:     1. History of abortion   2. Depression, unspecified depression type       Plan:     Check QHCG today Follow up in 1 week for [facility] if needed  Call [facility] may need increase on celexa

## 2016-06-29 ENCOUNTER — Telehealth: Payer: Self-pay | Admitting: Adult Health

## 2016-06-29 ENCOUNTER — Encounter: Payer: Self-pay | Admitting: Adult Health

## 2016-06-29 DIAGNOSIS — Z8759 Personal history of other complications of pregnancy, childbirth and the puerperium: Secondary | ICD-10-CM

## 2016-06-29 LAB — BETA HCG QUANT (REF LAB): hCG Quant: 2657 m[IU]/mL

## 2016-06-29 NOTE — Telephone Encounter (Signed)
Pt aware of labs and need to recheck labs in am if rising will need [place]

## 2016-07-01 LAB — BETA HCG QUANT (REF LAB): hCG Quant: 2441 m[IU]/mL

## 2016-07-03 ENCOUNTER — Telehealth: Payer: Self-pay | Admitting: Adult Health

## 2016-07-03 NOTE — Telephone Encounter (Signed)
Left message to call me.

## 2016-07-04 ENCOUNTER — Other Ambulatory Visit: Payer: Self-pay | Admitting: Adult Health

## 2016-07-04 DIAGNOSIS — O034 Incomplete spontaneous abortion without complication: Secondary | ICD-10-CM

## 2016-07-05 ENCOUNTER — Other Ambulatory Visit: Payer: Self-pay | Admitting: Adult Health

## 2016-07-05 ENCOUNTER — Ambulatory Visit (INDEPENDENT_AMBULATORY_CARE_PROVIDER_SITE_OTHER): Payer: Medicaid Other

## 2016-07-05 ENCOUNTER — Telehealth: Payer: Self-pay | Admitting: Adult Health

## 2016-07-05 DIAGNOSIS — O034 Incomplete spontaneous abortion without complication: Secondary | ICD-10-CM

## 2016-07-05 DIAGNOSIS — O074 Failed attempted termination of pregnancy without complication: Secondary | ICD-10-CM

## 2016-07-05 MED ORDER — MISOPROSTOL 200 MCG PO TABS: ORAL_TABLET | ORAL | 1 refills | Status: DC

## 2016-07-05 MED ORDER — IBUPROFEN 800 MG PO TABS: 800.0000 mg | ORAL_TABLET | Freq: Three times a day (TID) | ORAL | 1 refills | Status: DC | PRN

## 2016-07-05 NOTE — Telephone Encounter (Signed)
Haley [name] had [place] today that showed retained POC, with 13 mm endometrium with blood flow, will Rx Cytotec 800 mcg in vagina today and may repeat in 48 hours, recheck [facility] 07/10/16, pt voices understanding, was also discussed with Dr Emelda Fear.She is spotting still.

## 2016-07-05 NOTE — Progress Notes (Signed)
PELVIC US TA/TV: homogeneous anteverted uterus,wnl,heterogenous material within the endometrium cavity 13 mm w/color flow,suspected retained products of conception, right corpus luteum 1.9 x 1.6 x 1.6 cm,normal left ovary,no free fluid,Jennifer spoke with patient following ultrasound.

## 2016-07-10 ENCOUNTER — Other Ambulatory Visit: Payer: Self-pay | Admitting: Adult Health

## 2016-07-10 DIAGNOSIS — O074 Failed attempted termination of pregnancy without complication: Secondary | ICD-10-CM

## 2016-07-12 LAB — BETA HCG QUANT (REF LAB): hCG Quant: 414 m[IU]/mL

## 2016-08-10 ENCOUNTER — Encounter: Payer: Self-pay | Admitting: Adult Health

## 2016-08-21 ENCOUNTER — Other Ambulatory Visit: Payer: Self-pay | Admitting: Adult Health

## 2016-08-21 ENCOUNTER — Telehealth: Payer: Self-pay | Admitting: Adult Health

## 2016-08-21 DIAGNOSIS — Z8759 Personal history of other complications of pregnancy, childbirth and the puerperium: Secondary | ICD-10-CM

## 2016-08-21 NOTE — Progress Notes (Signed)
Will get F/U [facility] today

## 2016-08-22 LAB — BETA HCG QUANT (REF LAB): hCG Quant: 18 m[IU]/mL

## 2016-08-23 ENCOUNTER — Encounter: Payer: Self-pay | Admitting: Adult Health

## 2016-08-23 ENCOUNTER — Ambulatory Visit (INDEPENDENT_AMBULATORY_CARE_PROVIDER_SITE_OTHER): Payer: Medicaid Other | Admitting: Adult Health

## 2016-08-23 VITALS — BP 118/80 | HR 80 | Ht 61.0 in | Wt 122.0 lb

## 2016-08-23 DIAGNOSIS — F329 Major depressive disorder, single episode, unspecified: Secondary | ICD-10-CM

## 2016-08-23 DIAGNOSIS — Z3009 Encounter for other general counseling and advice on contraception: Secondary | ICD-10-CM

## 2016-08-23 DIAGNOSIS — F32A Depression, unspecified: Secondary | ICD-10-CM

## 2016-08-23 NOTE — Progress Notes (Signed)
Subjective:     Patient ID: Haley [name], female   DOB: 1990-12-09, 26 y.o.   MRN: [id number]  HPI Haley [name] is a 26 year old white female had recent abortion, in to discuss birth control. QHCG was 18 08/21/16. PCP is Dr Delton See.  Review of Systems  Thinks period is close,cramping and face broke out Reviewed past medical,surgical, social and family history. Reviewed medications and allergies.     Objective:   Physical Exam BP 118/80 (BP Location: Right Arm, Patient Position: Sitting, Cuff Size: Small)   Pulse 80   Ht 5\' 1"  (1.549 m)   Wt 122 lb (55.3 kg)   LMP 04/26/2016   BMI 23.05 kg/m  Skin warm and dry.  Lungs: clear to ausculation bilaterally. Cardiovascular: regular rate and rhythm.   Discussed ring and nexplanon and IUD and she wants nexplanon. PHQ 9 score 24, is on celexa and goes to [facility], denies being suicidal.  Assessment:     1. Contraceptive education   2. Depression, unspecified depression type       Plan:     No sex Order Nexplanon Return in 2 weeks for stat [facility] in am and nexplanon insertion in pm

## 2016-09-06 ENCOUNTER — Ambulatory Visit (INDEPENDENT_AMBULATORY_CARE_PROVIDER_SITE_OTHER): Payer: Medicaid Other | Admitting: Adult Health

## 2016-09-06 ENCOUNTER — Encounter: Payer: Self-pay | Admitting: Adult Health

## 2016-09-06 ENCOUNTER — Other Ambulatory Visit: Payer: Medicaid Other

## 2016-09-06 VITALS — BP 92/70 | HR 72 | Ht 61.0 in | Wt 119.0 lb

## 2016-09-06 DIAGNOSIS — Z308 Encounter for other contraceptive management: Secondary | ICD-10-CM

## 2016-09-06 DIAGNOSIS — Z3049 Encounter for surveillance of other contraceptives: Secondary | ICD-10-CM

## 2016-09-06 DIAGNOSIS — Z30017 Encounter for initial prescription of implantable subdermal contraceptive: Secondary | ICD-10-CM

## 2016-09-06 LAB — BETA HCG QUANT (REF LAB)

## 2016-09-06 NOTE — Patient Instructions (Signed)
Use condoms x 2 weeks, keep clean and dry x 24 hours, no heavy lifting, keep steri strips on x 72 hours, Keep pressure dressing on x 24 hours. Follow up prn problems. Return in 3 weeks for colpo with Dr Despina Hidden

## 2016-09-06 NOTE — Progress Notes (Signed)
Subjective:     Patient ID: Christella Hartigan, female   DOB: 1990/12/01, 26 y.o.   MRN: [id number]  HPI Meghna is a 26 year old white female in for nexplanon insertion, she is sp el ab and has not had sex and had [facility] ,1 this am.She had abnormal pap(LSIL in October and has not had colpo yet), will get scheduled.  Review of Systems For nexplanon insertion Reviewed past medical,surgical, social and family history. Reviewed medications and allergies.     Objective:   Physical Exam BP 92/70 (BP Location: Left Arm, Patient Position: Sitting, Cuff Size: Normal)   Pulse 72   Ht 5\' 1"  (1.549 m)   Wt 119 lb (54 kg)   LMP 08/24/2016 (Exact Date)   BMI 22.48 kg/m QHCG <1 this am. Consent signed, time out called. Left arm cleansed with betadine, and injected with 1.5 cc 1% lidocaine and waited til numb. Nexplanon easily inserted and steri strips applied.Rod easily palpated by provider and pt. Pressure dressing applied.    Assessment:     1. Nexplanon insertion   lot [id number] exp 09/2018    Plan:     Use condoms x 2 weeks, keep clean and dry x 24 hours, no heavy lifting, keep steri strips on x 72 hours, Keep pressure dressing on x 24 hours. Follow up prn problems. Return in 3 weeks for colpo with Dr Despina Hidden

## 2016-09-07 ENCOUNTER — Encounter: Payer: Self-pay | Admitting: Adult Health

## 2016-09-15 ENCOUNTER — Encounter: Payer: Self-pay | Admitting: Adult Health

## 2016-09-27 ENCOUNTER — Encounter: Payer: Self-pay | Admitting: Obstetrics & Gynecology

## 2016-09-28 ENCOUNTER — Encounter: Payer: Medicaid Other | Admitting: Obstetrics & Gynecology

## 2016-10-13 ENCOUNTER — Telehealth: Payer: Self-pay | Admitting: Family Medicine

## 2016-10-13 NOTE — Telephone Encounter (Signed)
Patient's fiance here to request a Cardiology referral.  Advised to make appointment to see Dr. Delton See as patient has not been seen in over 6 months.

## 2016-10-17 ENCOUNTER — Ambulatory Visit: Payer: Medicaid Other | Admitting: Family Medicine

## 2016-10-19 ENCOUNTER — Ambulatory Visit (INDEPENDENT_AMBULATORY_CARE_PROVIDER_SITE_OTHER): Payer: Medicaid Other | Admitting: Family Medicine

## 2016-10-19 ENCOUNTER — Encounter: Payer: Self-pay | Admitting: Family Medicine

## 2016-10-19 VITALS — BP 110/70 | HR 100 | Temp 98.5°F | Resp 16 | Ht 61.0 in | Wt 115.1 lb

## 2016-10-19 DIAGNOSIS — F4323 Adjustment disorder with mixed anxiety and depressed mood: Secondary | ICD-10-CM | POA: Diagnosis not present

## 2016-10-19 DIAGNOSIS — Z8249 Family history of ischemic heart disease and other diseases of the circulatory system: Secondary | ICD-10-CM | POA: Diagnosis not present

## 2016-10-19 DIAGNOSIS — R0789 Other chest pain: Secondary | ICD-10-CM

## 2016-10-19 DIAGNOSIS — R011 Cardiac murmur, unspecified: Secondary | ICD-10-CM

## 2016-10-19 NOTE — Progress Notes (Signed)
Chief Complaint  Patient presents with  . Follow-up   Currently in a flare of MS Still not on any suppressive medicines Is getting IV steroids. Had a spell at home where she felt like she was going to faint.  Her lips were pale.  She has anxiety disorder.  Chest [pain and tightness and feeling like could not breathe.  Resolved spontaneously. No palpitations.  Non exertional.  Has had a number of normal EKG tests over the years.  Had a heart murmur as a child, but not lately.  Had pre eclampsia when pregnant.  Has a mother with heart disease at an early age.  Her cholesterol is normal, no HTN or diabetes.  Non smoker.   Patient Active Problem List   Diagnosis Date Noted  . Family history of heart disease in female family member before age 86 10/19/2016  . Depression 06/28/2016  . Vitamin D deficiency 02/22/2016  . Adjustment disorder with mixed anxiety and depressed mood 02/09/2016  . Dysmenorrhea 02/09/2016  . Esophageal reflux 02/09/2016  . Optic neuritis 08/15/2013  . Multiple sclerosis (HCC) 05/02/2013    Outpatient Encounter Prescriptions as of 10/19/2016  Medication Sig  . citalopram (CELEXA) 10 MG tablet Take 10 mg by mouth daily.   . Multiple Vitamins-Minerals (MULTIVITAMIN WITH MINERALS) tablet Take 2 tablets by mouth daily.    No facility-administered encounter medications on file as of 10/19/2016.     Allergies  Allergen Reactions  . Penicillins Other (See Comments)    Unknown childhood reaction    Review of Systems  Constitutional: Negative for activity change, appetite change and unexpected weight change.  HENT: Negative for congestion, dental problem, postnasal drip and rhinorrhea.   Eyes: Positive for visual disturbance. Negative for redness.  Respiratory: Positive for chest tightness. Negative for cough and shortness of breath.   Cardiovascular: Positive for chest pain. Negative for palpitations and leg swelling.  Gastrointestinal: Negative for abdominal  pain, constipation and diarrhea.  Genitourinary: Negative for difficulty urinating, frequency and menstrual problem.  Musculoskeletal: Negative for arthralgias and back pain.  Neurological: Positive for dizziness. Negative for headaches.  Psychiatric/Behavioral: Negative for dysphoric mood and sleep disturbance. The patient is nervous/anxious.    BP 110/70 (BP Location: Right Arm, Patient Position: Sitting, Cuff Size: Normal)   Pulse 100   Temp 98.5 F (36.9 C) (Temporal)   Resp 16   Ht 5\' 1"  (1.549 m)   Wt 115 lb 1.3 oz (52.2 kg)   LMP 10/19/2016   SpO2 98%   BMI 21.74 kg/m   Physical Exam  Constitutional: She is oriented to person, place, and time. She appears well-developed and well-nourished.  HENT:  Head: Normocephalic and atraumatic.  Right Ear: External ear normal.  Left Ear: External ear normal.  Mouth/Throat: Oropharynx is clear and moist.  Eyes: Conjunctivae are normal. Pupils are equal, round, and reactive to light.  Neck: Normal range of motion. Neck supple. No thyromegaly present.  Cardiovascular: Normal rate and regular rhythm.   Murmur heard. Faint systolic murmur audible ar RUSB  Pulmonary/Chest: Effort normal and breath sounds normal. No respiratory distress.  Abdominal: Soft. Bowel sounds are normal.  Musculoskeletal: Normal range of motion. She exhibits no edema.  Lymphadenopathy:    She has no cervical adenopathy.  Neurological: She is alert and oriented to person, place, and time. She displays abnormal reflex.  BRISK reflexes,Gait normal  Skin: Skin is warm and dry.  Psychiatric: Her mood appears anxious. Her affect is labile. She expresses  impulsivity and inappropriate judgment.  Immature, poor judgement, anxiety  Nursing note and vitals reviewed.   ASSESSMENT/PLAN:  1. Adjustment disorder with mixed anxiety and depressed mood On celexa  2. Family history of heart disease in female family member before age 48 - Lipid panel  3. Chest pain,  atypical - COMPLETE METABOLIC PANEL WITH GFR - CBC with Differential/Platelet - Lipid panel - IBC panel - ECHOCARDIOGRAM COMPLETE; Future  4. Faint heart murmur - ECHOCARDIOGRAM COMPLETE; Future Greater than 50% of this visit was spent in counseling and coordinating care.  Total face to face time:  25 min discussing heart murmurs, on diagram, and CVD risk factors.  Periorbital pallor is anxiety.   Patient Instructions  Lab work today Echocardiogram ordered See me after testing   Eustace Moore, MD

## 2016-10-19 NOTE — Patient Instructions (Signed)
Lab work today Echocardiogram ordered See me after testing

## 2016-10-20 ENCOUNTER — Encounter: Payer: Self-pay | Admitting: Family Medicine

## 2016-10-20 LAB — IRON AND TIBC
%SAT: 19 % (ref 11–50)
IRON: 63 ug/dL (ref 40–190)
TIBC: 334 ug/dL (ref 250–450)
UIBC: 271 ug/dL

## 2016-10-20 LAB — CBC WITH DIFFERENTIAL/PLATELET
Basophils Absolute: 0 cells/uL (ref 0–200)
Basophils Relative: 0 %
EOS ABS: 0 {cells}/uL — AB (ref 15–500)
Eosinophils Relative: 0 %
HCT: 40.7 % (ref 35.0–45.0)
Hemoglobin: 13.3 g/dL (ref 11.7–15.5)
LYMPHS PCT: 7 %
Lymphs Abs: 1295 cells/uL (ref 850–3900)
MCH: 29.4 pg (ref 27.0–33.0)
MCHC: 32.7 g/dL (ref 32.0–36.0)
MCV: 89.8 fL (ref 80.0–100.0)
MONOS PCT: 3 %
MPV: 11.2 fL (ref 7.5–12.5)
Monocytes Absolute: 555 cells/uL (ref 200–950)
Neutro Abs: 16650 cells/uL — ABNORMAL HIGH (ref 1500–7800)
Neutrophils Relative %: 90 %
PLATELETS: 315 10*3/uL (ref 140–400)
RBC: 4.53 MIL/uL (ref 3.80–5.10)
RDW: 14.1 % (ref 11.0–15.0)
WBC: 18.5 10*3/uL — ABNORMAL HIGH (ref 3.8–10.8)

## 2016-10-20 LAB — COMPLETE METABOLIC PANEL WITH GFR
ALT: 20 U/L (ref 6–29)
AST: 14 U/L (ref 10–30)
Albumin: 4.4 g/dL (ref 3.6–5.1)
Alkaline Phosphatase: 58 U/L (ref 33–115)
BILIRUBIN TOTAL: 0.4 mg/dL (ref 0.2–1.2)
BUN: 15 mg/dL (ref 7–25)
CO2: 22 mmol/L (ref 20–31)
CREATININE: 0.63 mg/dL (ref 0.50–1.10)
Calcium: 10 mg/dL (ref 8.6–10.2)
Chloride: 105 mmol/L (ref 98–110)
GFR, Est African American: >89 mL/min (ref >=60)
GFR, Est Non African American: >89 mL/min (ref >=60)
GLUCOSE: 128 mg/dL — AB (ref 65–99)
Potassium: 4.4 mmol/L (ref 3.5–5.3)
SODIUM: 140 mmol/L (ref 135–146)
TOTAL PROTEIN: 7 g/dL (ref 6.1–8.1)

## 2016-10-20 LAB — LIPID PANEL
CHOL/HDL RATIO: 2.7 ratio (ref <5.0)
Cholesterol: 210 mg/dL — ABNORMAL HIGH (ref <200)
HDL: 78 mg/dL (ref >50)
LDL CALC: 106 mg/dL — AB (ref <100)
Triglycerides: 129 mg/dL (ref <150)
VLDL: 26 mg/dL (ref <30)

## 2016-10-26 ENCOUNTER — Ambulatory Visit (HOSPITAL_COMMUNITY)
Admission: RE | Admit: 2016-10-26 | Discharge: 2016-10-26 | Disposition: A | Discharge status: Home / Self Care | Payer: Medicaid Other | Source: Ambulatory Visit | Attending: Family Medicine | Admitting: Family Medicine

## 2016-10-26 DIAGNOSIS — R0789 Other chest pain: Secondary | ICD-10-CM | POA: Insufficient documentation

## 2016-10-26 DIAGNOSIS — R011 Cardiac murmur, unspecified: Secondary | ICD-10-CM | POA: Diagnosis present

## 2016-10-26 NOTE — Progress Notes (Signed)
*  PRELIMINARY RESULTS* Echocardiogram 2D Echocardiogram has been performed.  Haley [name] 10/26/2016, 2:20 PM

## 2016-10-30 ENCOUNTER — Telehealth: Payer: Self-pay

## 2016-10-30 NOTE — Telephone Encounter (Signed)
-----   Message from Eustace Moore, MD sent at 10/30/2016  7:54 AM EDT ----- Call and tell patient her echocardiogram was normal

## 2016-10-30 NOTE — Telephone Encounter (Signed)
Left message for pt to call office

## 2016-10-31 ENCOUNTER — Encounter: Payer: Self-pay | Admitting: Family Medicine

## 2016-11-01 ENCOUNTER — Telehealth: Payer: Self-pay

## 2016-11-01 NOTE — Telephone Encounter (Signed)
Left message for pt to call office

## 2016-11-01 NOTE — Telephone Encounter (Signed)
-----   Message from Eustace Moore, MD sent at 10/30/2016  7:54 AM EDT ----- Call and tell patient her echocardiogram was normal

## 2016-11-09 ENCOUNTER — Ambulatory Visit: Payer: Medicaid Other | Admitting: Family Medicine

## 2016-11-14 ENCOUNTER — Encounter: Payer: Self-pay | Admitting: Family Medicine

## 2017-03-12 ENCOUNTER — Ambulatory Visit (INDEPENDENT_AMBULATORY_CARE_PROVIDER_SITE_OTHER): Payer: Medicaid Other

## 2017-03-12 DIAGNOSIS — Z23 Encounter for immunization: Secondary | ICD-10-CM | POA: Diagnosis not present

## 2017-03-24 ENCOUNTER — Other Ambulatory Visit: Payer: Self-pay

## 2017-03-24 ENCOUNTER — Emergency Department (HOSPITAL_COMMUNITY)
Admission: EM | Admit: 2017-03-24 | Discharge: 2017-03-24 | Disposition: A | Discharge status: Home / Self Care | Payer: Medicaid Other | Attending: Emergency Medicine | Admitting: Emergency Medicine

## 2017-03-24 ENCOUNTER — Encounter (HOSPITAL_COMMUNITY): Payer: Self-pay | Admitting: *Deleted

## 2017-03-24 DIAGNOSIS — I10 Essential (primary) hypertension: Secondary | ICD-10-CM | POA: Insufficient documentation

## 2017-03-24 DIAGNOSIS — R319 Hematuria, unspecified: Secondary | ICD-10-CM | POA: Diagnosis not present

## 2017-03-24 DIAGNOSIS — Z79899 Other long term (current) drug therapy: Secondary | ICD-10-CM | POA: Insufficient documentation

## 2017-03-24 DIAGNOSIS — R1030 Lower abdominal pain, unspecified: Secondary | ICD-10-CM | POA: Diagnosis present

## 2017-03-24 DIAGNOSIS — Z87891 Personal history of nicotine dependence: Secondary | ICD-10-CM | POA: Diagnosis not present

## 2017-03-24 DIAGNOSIS — N390 Urinary tract infection, site not specified: Secondary | ICD-10-CM

## 2017-03-24 LAB — URINALYSIS, ROUTINE W REFLEX MICROSCOPIC
BACTERIA UA: NONE SEEN
Bilirubin Urine: NEGATIVE
Glucose, UA: NEGATIVE mg/dL
KETONES UR: 5 mg/dL — AB
LEUKOCYTES UA: NEGATIVE
Nitrite: POSITIVE — AB
PH: 5 (ref 5.0–8.0)
PROTEIN: NEGATIVE mg/dL
Specific Gravity, Urine: 1.02 (ref 1.005–1.030)

## 2017-03-24 LAB — PREGNANCY, URINE: PREG TEST UR: NEGATIVE

## 2017-03-24 MED ORDER — DEXTROSE 5 % IV SOLN
1.0000 g | Freq: Once | INTRAVENOUS | Status: AC
  Administered 2017-03-24: 1 g via INTRAVENOUS
  Filled 2017-03-24 (×2): qty 1

## 2017-03-24 MED ORDER — ONDANSETRON HCL 4 MG PO TABS: 4.0000 mg | ORAL_TABLET | Freq: Three times a day (TID) | ORAL | 0 refills | Status: DC | PRN

## 2017-03-24 MED ORDER — PHENAZOPYRIDINE HCL 200 MG PO TABS: 200.0000 mg | ORAL_TABLET | Freq: Three times a day (TID) | ORAL | 0 refills | Status: DC

## 2017-03-24 MED ORDER — SULFAMETHOXAZOLE-TRIMETHOPRIM 800-160 MG PO TABS: 1.0000 | ORAL_TABLET | Freq: Two times a day (BID) | ORAL | 0 refills | Status: DC

## 2017-03-24 NOTE — ED Triage Notes (Signed)
Pt c/o pain with urination x 3 days; pt denies any urgency but states when she does go to urinate it's only a small stream

## 2017-03-24 NOTE — ED Provider Notes (Signed)
[facility] EMERGENCY DEPARTMENT Provider Note   CSN: [id number] Arrival date & time: 03/24/17  3086  Time seen 05:55 am   History   Chief Complaint Chief Complaint  Patient presents with  . Dysuria    HPI Haley [name] is a 26 y.o. female.  HPI patient reports about 3 days ago she started having trouble trying to urinate and she feels like she is passing razor blades when she does urinate.  She has had chills without fever until a couple of hours ago when she started having a temperature of 99.6 and then it went up to 100.5.  She took Tylenol prior to coming to the ED.  She complains of suprapubic pain and bilateral lower back pain that is worse possibly on the right more than the left.  She describes the pain as a numb and cramping pain and heaviness and achy.  She had some nausea tonight without vomiting.  She had diarrhea once that was watery and loose and was a prolonged episode.  She states there were a few specks of blood on it.  She states she has had UTIs in the past but generally has urgency with it.  She has taken Pyridium prior to coming to the ED.  Patient has multiple sclerosis and is taking Copaxone.  Her last injection was on November 9.  PCP Eustace Moore, MD Neurology Dr Renne Crigler at [facility]  Past Medical History:  Diagnosis Date  . Abnormal Pap smear   . Allergy    mild  . Anemia    post partum  . Anxiety   . Borderline personality disorder (HCC)   . Depression with anxiety   . GERD (gastroesophageal reflux disease)   . Heart murmur    as a baby  . HSV-2 (herpes simplex virus 2) infection   . HSV-2 (herpes simplex virus 2) infection    Suppress @ 34wks:______________   . Hx of chlamydia infection   . Hypertension    Only when pregnant  . Mental disorder    depression,postpartum  . Multiple sclerosis (HCC)   . Optic neuritis    Right  . Postpartum depression 04/03/2013  . Pregnant   . PTSD (post-traumatic stress disorder)   .  Right foot drop 04/03/2013   Has - babinski, some muscle weakness  Noted when trying to curl toes  . UTI (lower urinary tract infection) 09/22/2013  . Vitamin D deficiency 02/22/2016    Patient Active Problem List   Diagnosis Date Noted  . Family history of heart disease in female family member before age 79 10/19/2016  . Depression 06/28/2016  . Vitamin D deficiency 02/22/2016  . Adjustment disorder with mixed anxiety and depressed mood 02/09/2016  . Dysmenorrhea 02/09/2016  . Esophageal reflux 02/09/2016  . Optic neuritis 08/15/2013  . Multiple sclerosis (HCC) 05/02/2013    Past Surgical History:  Procedure Laterality Date  . WISDOM TOOTH EXTRACTION  2012    OB History    Gravida Para Term Preterm AB Living   3 2 1 1 1 2    SAB TAB Ectopic Multiple Live Births           2       Home Medications    Copaxone on M W and F  Prior to Admission medications   Medication Sig Start Date End Date Taking? Authorizing Provider  citalopram (CELEXA) 10 MG tablet Take 10 mg by mouth daily.  01/27/16   [provider]  Multiple Vitamins-Minerals (MULTIVITAMIN WITH MINERALS) tablet Take 2 tablets by mouth daily.     [provider]  ondansetron (ZOFRAN) 4 MG tablet Take 1 tablet (4 mg total) every 8 (eight) hours as needed by mouth. 03/24/17   Devoria Albe, MD  phenazopyridine (PYRIDIUM) 200 MG tablet Take 1 tablet (200 mg total) 3 (three) times daily by mouth. 03/24/17   Devoria Albe, MD  sulfamethoxazole-trimethoprim (BACTRIM DS,SEPTRA DS) 800-160 MG tablet Take 1 tablet 2 (two) times daily by mouth. 03/24/17   Devoria Albe, MD    Family History Family History  Problem Relation Age of Onset  . Anxiety disorder Mother   . Heart disease Mother   . Heart attack Paternal Aunt   . Cancer Paternal Grandfather        lung  . Stroke Maternal Grandmother   . Breast cancer Maternal Grandmother   . Stroke Maternal Grandfather        brain aneurysm    Social History Social  History   Tobacco Use  . Smoking status: Former Smoker    Years: 4.00    Types: Cigarettes    Last attempt to quit: 12/14/2010    Years since quitting: 6.2  . Smokeless tobacco: Never Used  Substance Use Topics  . Alcohol use: No    Alcohol/week: 0.0 oz  . Drug use: No     Allergies   Penicillins   Review of Systems Review of Systems  All other systems reviewed and are negative.    Physical Exam Updated Vital Signs BP 127/88 (BP Location: Right Arm)   Pulse 97   Temp 99.5 F (37.5 C) (Oral)   Resp 16   Ht 5\' 1"  (1.549 m)   Wt 53.1 kg (117 lb)   SpO2 95%   BMI 22.11 kg/m   Vital signs normal    Physical Exam  Constitutional: She is oriented to person, place, and time. She appears well-developed and well-nourished.  Non-toxic appearance. She does not appear ill. No distress.  Please note it was hard to get history from the patient because she is showing myself and nursing staff pictures of her premature baby and then pictures of her teeth that she is doing the invisaline type treatment.  HENT:  Head: Normocephalic and atraumatic.  Right Ear: External ear normal.  Left Ear: External ear normal.  Nose: Nose normal. No mucosal edema or rhinorrhea.  Mouth/Throat: Oropharynx is clear and moist and mucous membranes are normal. No dental abscesses or uvula swelling.  Eyes: Conjunctivae and EOM are normal. Pupils are equal, round, and reactive to light.  Neck: Normal range of motion and full passive range of motion without pain. Neck supple.  Cardiovascular: Normal rate, regular rhythm and normal heart sounds. Exam reveals no gallop and no friction rub.  No murmur heard. Pulmonary/Chest: Effort normal and breath sounds normal. No respiratory distress. She has no wheezes. She has no rhonchi. She has no rales. She exhibits no tenderness and no crepitus.  Abdominal: Soft. Normal appearance and bowel sounds are normal. She exhibits no distension. There is tenderness in the  suprapubic area. There is no rebound and no guarding.  minimal Right flank tenderness  Musculoskeletal: Normal range of motion. She exhibits no edema or tenderness.  Moves all extremities well.   Neurological: She is alert and oriented to person, place, and time. She has normal strength. No cranial nerve deficit.  Skin: Skin is warm, dry and intact. No rash noted. No erythema. No pallor.  Psychiatric: She has a normal mood and affect. Her speech is normal and behavior is normal. Her mood appears not anxious.  Nursing note and vitals reviewed.    ED Treatments / Results  Labs (all labs ordered are listed, but only abnormal results are displayed) Results for orders placed or performed during the hospital encounter of 03/24/17  Pregnancy, urine  Result Value Ref Range   Preg Test, Ur NEGATIVE NEGATIVE  Urinalysis, Routine w reflex microscopic  Result Value Ref Range   Color, Urine AMBER (A) YELLOW   APPearance CLEAR CLEAR   Specific Gravity, Urine 1.020 1.005 - 1.030   pH 5.0 5.0 - 8.0   Glucose, UA NEGATIVE NEGATIVE mg/dL   Hgb urine dipstick SMALL (A) NEGATIVE   Bilirubin Urine NEGATIVE NEGATIVE   Ketones, ur 5 (A) NEGATIVE mg/dL   Protein, ur NEGATIVE NEGATIVE mg/dL   Nitrite POSITIVE (A) NEGATIVE   Leukocytes, UA NEGATIVE NEGATIVE   RBC / HPF TOO NUMEROUS TO COUNT 0 - 5 RBC/hpf   WBC, UA TOO NUMEROUS TO COUNT 0 - 5 WBC/hpf   Bacteria, UA NONE SEEN NONE SEEN   Squamous Epithelial / LPF 6-30 (A) NONE SEEN   Laboratory interpretation all normal except UTI, urine culture sent   On review of her prior studies patient had a urine culture done in May 2015 that was over 100,000 colonies of coagulase-negative staph species, that was resistant to Ancef, oxacillin, and penicillin.  It was sensitive to Cipro, Levaquin, Macrobid, tetracycline, Septra DS, and vancomycin. She had been placed on macrobid.   At [facility] in July 2013 she had a urine culture growing >100,000 cfu/ml  Staphylococcus saprophyticus  EKG  EKG Interpretation None       Radiology No results found.  Procedures Procedures (including critical care time)  Medications Ordered in ED Medications  aztreonam (AZACTAM) 1 g in dextrose 5 % 50 mL IVPB (not administered)     Initial Impression / Assessment and Plan / ED Course  I have reviewed the triage vital signs and the nursing notes.  Pertinent labs & imaging results that were available during my care of the patient were reviewed by me and considered in my medical decision making (see chart for details).    Patient was given the option of IV, IM, or oral antibiotics and she chose IV.  Recheck at 7:10 AM patient was asked when her last antibiotic was for her UTIs and how often she gets them.  She states she is never placed on antibiotics for them and she takes over-the-counter Azo Gantrisin for the symptoms.  She is waiting for her IV antibiotic to start.  After reviewing her urine cultures the patient was discharged home on Septra DS.  This way if she had a staph species or even a gram-negative rod it should cover her infection.  With her fever, nausea, and mild right flank pain she is probably trying to develop a pyelonephritis.  However she has been in no distress in the ED and is able to do outpatient therapy.   Final Clinical Impressions(s) / ED Diagnoses   Final diagnoses:  Urinary tract infection with hematuria, site unspecified    ED Discharge Orders        Ordered    sulfamethoxazole-trimethoprim (BACTRIM DS,SEPTRA DS) 800-160 MG tablet  2 times daily     03/24/17 0719    phenazopyridine (PYRIDIUM) 200 MG tablet  3 times daily     03/24/17 0719    ondansetron (ZOFRAN)  4 MG tablet  Every 8 hours PRN     03/24/17 0719     Plan discharge  Devoria Albe, MD, Concha Pyo, MD 03/24/17 [phone]

## 2017-03-24 NOTE — Discharge Instructions (Signed)
Drink plenty of fluids. Take the antibiotic until gone. Take acetaminophen 650 mg + ibuprofen 600 mg every 6 hrs as needed for fever or pain. Use the zofran for nausea or vomiting. Return if you have uncontrolled vomiting, worsening pain or uncontrolled fever.

## 2017-03-26 LAB — URINE CULTURE

## 2017-03-27 ENCOUNTER — Telehealth: Payer: Self-pay | Admitting: Emergency Medicine

## 2017-03-27 NOTE — Telephone Encounter (Signed)
Post ED Visit - Positive Culture Follow-up  Culture report reviewed by antimicrobial stewardship pharmacist:  []  Enzo Bi, Pharm.D. []  Celedonio Miyamoto, [address].D., BCPS AQ-ID []  Garvin Fila, Pharm.D., BCPS []  Georgina Pillion, Pharm.D., BCPS []  [place], [address].D., BCPS, AAHIVP []  Estella Husk, Pharm.D., BCPS, AAHIVP []  Lysle Pearl, PharmD, BCPS []  Casilda Carls, PharmD, BCPS []  Pollyann Samples, PharmD, BCPS  Positive urine culture Treated with sulfamethoxazole-trimethoprim, organism sensitive to the same and no further patient follow-up is required at this time.  Berle Mull 03/27/2017, 12:07 PM

## 2017-03-29 ENCOUNTER — Encounter: Payer: Self-pay | Admitting: Adult Health

## 2017-04-09 ENCOUNTER — Telehealth: Payer: Self-pay | Admitting: Family Medicine

## 2017-04-09 NOTE — Telephone Encounter (Signed)
Patient's husband called back and left another message requesting for an appointment today, per the husband they will be in town and want to know if they need to come here, per husband patient has been having a fever for 2-3 days 99.8. Please advise [phone] or [phone]

## 2017-04-09 NOTE — Telephone Encounter (Signed)
Patient is scheduled for Wednesday at 8:00. Patient is aware.

## 2017-04-09 NOTE — Telephone Encounter (Signed)
Patients husband called in this morning requesting to make an appt for patient for a hospital f/u, says she has a kidney infection and a fever of 99.6. Next availability is 04/19/17 at 9am, is this okay ?  Cb#: [phone]

## 2017-04-09 NOTE — Telephone Encounter (Signed)
I called and spoke with patient, patient states if she can be seen it will need to be either before 1:45 or later after due to picking up her kids at school.

## 2017-04-09 NOTE — Telephone Encounter (Signed)
Called Addeline, no answer, LMTCB, can be seen wed am.

## 2017-04-09 NOTE — Telephone Encounter (Signed)
Call Haley [name] ( not husband)  Push fluids, No Azo over the counter med,  See me in am

## 2017-04-10 ENCOUNTER — Emergency Department (HOSPITAL_COMMUNITY)
Admission: EM | Admit: 2017-04-10 | Discharge: 2017-04-10 | Disposition: A | Discharge status: Home / Self Care | Payer: Medicaid Other | Attending: Emergency Medicine | Admitting: Emergency Medicine

## 2017-04-10 ENCOUNTER — Encounter (HOSPITAL_COMMUNITY): Payer: Self-pay | Admitting: *Deleted

## 2017-04-10 DIAGNOSIS — Z5321 Procedure and treatment not carried out due to patient leaving prior to being seen by health care provider: Secondary | ICD-10-CM | POA: Diagnosis not present

## 2017-04-10 DIAGNOSIS — R1084 Generalized abdominal pain: Secondary | ICD-10-CM | POA: Diagnosis not present

## 2017-04-10 LAB — URINALYSIS, ROUTINE W REFLEX MICROSCOPIC
Bilirubin Urine: NEGATIVE
GLUCOSE, UA: NEGATIVE mg/dL
Hgb urine dipstick: NEGATIVE
Ketones, ur: NEGATIVE mg/dL
LEUKOCYTES UA: NEGATIVE
Nitrite: NEGATIVE
PH: 6 (ref 5.0–8.0)
Protein, ur: NEGATIVE mg/dL
SPECIFIC GRAVITY, URINE: 1.002 — AB (ref 1.005–1.030)

## 2017-04-10 LAB — PREGNANCY, URINE: Preg Test, Ur: NEGATIVE

## 2017-04-10 NOTE — Telephone Encounter (Signed)
Will Cross called and is wanting the nurse to Mychart message the patient back regarding the symptoms she is having pertaining to her UTI and what she should do .  Please see below message. He states they are going to try to go to the ER. Please advise. Thank you

## 2017-04-10 NOTE — Telephone Encounter (Signed)
Called and spoke to Haley [name], states she was seen in the ed 17 days ago, was dx with a uti and given 10 days of abx, she states she is worried about sepsis and is going back to ed today. Pt has appt with dr Delton See tomorrow at 0800, is reminded of this as well.

## 2017-04-10 NOTE — ED Notes (Signed)
Pt has to leave to pick a kid from school.

## 2017-04-10 NOTE — ED Triage Notes (Signed)
Pt with flank pain bilaterally but more on right. Recent dx of UTI and treated.  Pt with burning on urination with fever and nausea.

## 2017-04-10 NOTE — Telephone Encounter (Signed)
I am uncertain why she would be under the impression she has sepsis, clearly she has no idea what sepsis is or what the symptoms would be.  She is offered an appointment for tomorrow as no appointments are available today.  Low-grade temperature of 99 without other symptoms is not an indication for the ER.

## 2017-04-11 ENCOUNTER — Other Ambulatory Visit: Payer: Self-pay

## 2017-04-11 ENCOUNTER — Ambulatory Visit (INDEPENDENT_AMBULATORY_CARE_PROVIDER_SITE_OTHER): Payer: Medicaid Other | Admitting: Family Medicine

## 2017-04-11 ENCOUNTER — Encounter: Payer: Self-pay | Admitting: Family Medicine

## 2017-04-11 VITALS — BP 110/74 | HR 76 | Temp 98.9°F | Resp 16 | Ht 61.0 in | Wt 117.0 lb

## 2017-04-11 DIAGNOSIS — N12 Tubulo-interstitial nephritis, not specified as acute or chronic: Secondary | ICD-10-CM | POA: Diagnosis not present

## 2017-04-11 DIAGNOSIS — R300 Dysuria: Secondary | ICD-10-CM | POA: Diagnosis not present

## 2017-04-11 LAB — POCT URINALYSIS DIPSTICK
Bilirubin, UA: NEGATIVE
Blood, UA: NEGATIVE
Glucose, UA: NEGATIVE
Ketones, UA: NEGATIVE
Leukocytes, UA: NEGATIVE
Nitrite, UA: POSITIVE
Protein, UA: NEGATIVE
Spec Grav, UA: 1.025 (ref 1.010–1.025)
Urobilinogen, UA: 0.2 E.U./dL
pH, UA: 6 (ref 5.0–8.0)

## 2017-04-11 MED ORDER — ONDANSETRON HCL 4 MG PO TABS: 4.0000 mg | ORAL_TABLET | Freq: Three times a day (TID) | ORAL | 0 refills | Status: DC | PRN

## 2017-04-11 MED ORDER — CEFTRIAXONE SODIUM 500 MG IJ SOLR
1000.0000 mg | Freq: Once | INTRAMUSCULAR | Status: AC
  Administered 2017-04-11: 1000 mg via INTRAMUSCULAR

## 2017-04-11 MED ORDER — FLUCONAZOLE 150 MG PO TABS: 150.0000 mg | ORAL_TABLET | Freq: Once | ORAL | 1 refills | Status: AC

## 2017-04-11 MED ORDER — CIPROFLOXACIN HCL 500 MG PO TABS: 500.0000 mg | ORAL_TABLET | Freq: Two times a day (BID) | ORAL | 0 refills | Status: DC

## 2017-04-11 NOTE — Patient Instructions (Signed)
Push fluids zofran for nausea Take the cipro 2 x a day for 10 days Use diflucan as needed  Go to lab  See me early  Next week Go to ER if worse, vomiting, high fever

## 2017-04-11 NOTE — Progress Notes (Signed)
Chief Complaint  Patient presents with  . Follow-up   Patient is here for emergency room follow-up.  She went to the emergency room on 03/24/2017 for a urinary tract infection.  The urine grew a staph species and she was treated with 10 days of Septra.  On the culture and sensitivity, this was an appropriate choice.  After 10 days of antibiotics, she continued not to feel well and had urinary symptoms.  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  She is here this morning for follow-up.  She had a fever to 100.5.  Nausea.  2 episodes of vomiting.  Right flank pain, dysuria that is worsening over the last few days.  Vomiting is controlled with Zofran.  No history of kidney infections in the past.  Patient is on Copaxone injections for her multiple sclerosis and is worried about being immune compromised.  She worries about "sepsis".  Because she read that it is possible.  She is certain that she is not pregnant.  She has no vaginal discharge or symptoms.  Patient Active Problem List   Diagnosis Date Noted  . Family history of heart disease in female family member before age 28 10/19/2016  . Depression 06/28/2016  . Vitamin D deficiency 02/22/2016  . Adjustment disorder with mixed anxiety and depressed mood 02/09/2016  . Dysmenorrhea 02/09/2016  . Esophageal reflux 02/09/2016  . Optic neuritis 08/15/2013  . Multiple sclerosis (HCC) 05/02/2013    Outpatient Encounter Medications as of 04/11/2017  Medication Sig  . citalopram (CELEXA) 10 MG tablet Take 10 mg by mouth daily.   Marland Kitchen. Glatiramer Acetate (COPAXONE) 40 MG/ML SOSY Inject 40 mg into the skin.  Marland Kitchen. ondansetron (ZOFRAN) 4 MG tablet Take 1 tablet (4 mg total) by mouth every 8 (eight) hours as needed.  . ciprofloxacin (CIPRO) 500 MG tablet Take 1 tablet (500 mg total) by mouth 2 (two) times daily.  . fluconazole (DIFLUCAN) 150 MG tablet Take 1 tablet (150 mg total) by mouth once for  1 dose.  . [EXPIRED] cefTRIAXone (ROCEPHIN) injection 1,000 mg    No facility-administered encounter medications on file as of 04/11/2017.     Allergies  Allergen Reactions  . Penicillins Other (See Comments)    Unknown childhood reaction    Review of Systems  Constitutional: Positive for appetite change, fatigue and fever.  HENT: Negative for congestion and dental problem.   Eyes: Negative for photophobia and visual disturbance.  Respiratory: Negative for cough and shortness of breath.   Cardiovascular: Negative for chest pain and palpitations.  Gastrointestinal: Positive for abdominal pain. Negative for abdominal distention.  Genitourinary: Positive for flank pain.  Musculoskeletal: Negative for back pain and gait problem.  Neurological: Positive for weakness. Negative for dizziness and headaches.    BP 110/74 (BP Location: Right Arm, Patient Position: Sitting, Cuff Size: Normal)   Pulse 76   Temp 98.9 F (37.2 C) (Temporal)   Resp 16   Ht 5\' 1"  (1.549 m)   Wt 117 lb (53.1 kg)   LMP 03/26/2017   SpO2 100%   BMI 22.11 kg/m   Physical Exam  Constitutional: She is oriented to person, place, and time. She appears well-developed and well-nourished.  Appears mildly ill, fatigued  HENT:  Head: Normocephalic and atraumatic.  Mouth/Throat: Oropharynx is clear and moist.  Eyes: Conjunctivae are normal. Pupils are equal, round, and reactive to light.  Neck: Normal range of motion.  Cardiovascular: Normal rate, regular rhythm and normal heart sounds.  Pulmonary/Chest: Effort normal and breath sounds normal.  Abdominal: Soft. Bowel sounds are normal.  Right abdominal tenderness to deep palpation, right flank pain and CVAT  Lymphadenopathy:    She has no cervical adenopathy.  Neurological: She is alert and oriented to person, place, and time.  Psychiatric: She has a normal mood and affect. Her behavior is normal.    ASSESSMENT/PLAN:  1. Dysuria - POCT urinalysis  dipstick - Urine Culture  2. Pyelonephritis - Urine Culture - Urine cytology ancillary only - cefTRIAXone (ROCEPHIN) injection 1,000 mg - CBC with Differential/Platelet   Patient Instructions  Push fluids zofran for nausea Take the cipro 2 x a day for 10 days Use diflucan as needed  Go to lab  See me early  Next week Go to ER if worse, vomiting, high fever   Eustace Moore, MD

## 2017-04-12 LAB — CBC WITH DIFFERENTIAL/PLATELET
Basophils Absolute: 39 {cells}/uL (ref 0–200)
Basophils Relative: 0.6 %
Eosinophils Absolute: 137 {cells}/uL (ref 15–500)
Eosinophils Relative: 2.1 %
HCT: 36.1 % (ref 35.0–45.0)
Hemoglobin: 12.3 g/dL (ref 11.7–15.5)
Lymphs Abs: 1924 {cells}/uL (ref 850–3900)
MCH: 29.8 pg (ref 27.0–33.0)
MCHC: 34.1 g/dL (ref 32.0–36.0)
MCV: 87.4 fL (ref 80.0–100.0)
MPV: 11 fL (ref 7.5–12.5)
Monocytes Relative: 7.1 %
Neutro Abs: 3939 {cells}/uL (ref 1500–7800)
Neutrophils Relative %: 60.6 %
Platelets: 278 Thousand/uL (ref 140–400)
RBC: 4.13 Million/uL (ref 3.80–5.10)
RDW: 11.7 % (ref 11.0–15.0)
Total Lymphocyte: 29.6 %
WBC mixed population: 462 {cells}/uL (ref 200–950)
WBC: 6.5 Thousand/uL (ref 3.8–10.8)

## 2017-04-12 LAB — URINE CULTURE
MICRO NUMBER:: 81336221
SPECIMEN QUALITY:: ADEQUATE

## 2017-04-17 ENCOUNTER — Encounter: Payer: Self-pay | Admitting: Family Medicine

## 2017-04-17 ENCOUNTER — Ambulatory Visit: Payer: Medicaid Other | Admitting: Family Medicine

## 2017-04-20 ENCOUNTER — Ambulatory Visit (INDEPENDENT_AMBULATORY_CARE_PROVIDER_SITE_OTHER): Payer: Medicaid Other | Admitting: Family Medicine

## 2017-04-20 ENCOUNTER — Other Ambulatory Visit: Payer: Self-pay

## 2017-04-20 ENCOUNTER — Encounter: Payer: Self-pay | Admitting: Family Medicine

## 2017-04-20 VITALS — BP 116/68 | HR 68 | Temp 98.4°F | Resp 16 | Ht 61.0 in | Wt 121.0 lb

## 2017-04-20 DIAGNOSIS — R109 Unspecified abdominal pain: Secondary | ICD-10-CM | POA: Diagnosis not present

## 2017-04-20 DIAGNOSIS — R300 Dysuria: Secondary | ICD-10-CM | POA: Diagnosis not present

## 2017-04-20 NOTE — Progress Notes (Signed)
Chief Complaint  Patient presents with  . Follow-up    dysuria   Patient is here for follow-up.  She no longer has dysuria or frequency.  She no longer has any flank pain.  Her energy level is better.  She has no fever.  I reviewed with her her test results from last time.  Her CBC was normal.  Her urine culture was negative.  I advised that she is ready to go back on her Copaxone for MS.  No new symptoms to report.  Patient Active Problem List   Diagnosis Date Noted  . Family history of heart disease in female family member before age 70 10/19/2016  . Depression 06/28/2016  . Vitamin D deficiency 02/22/2016  . Adjustment disorder with mixed anxiety and depressed mood 02/09/2016  . Dysmenorrhea 02/09/2016  . Esophageal reflux 02/09/2016  . Optic neuritis 08/15/2013  . Multiple sclerosis (HCC) 05/02/2013    Outpatient Encounter Medications as of 04/20/2017  Medication Sig  . Glatiramer Acetate (COPAXONE) 40 MG/ML SOSY Inject 40 mg into the skin.  . ciprofloxacin (CIPRO) 500 MG tablet Take 1 tablet (500 mg total) by mouth 2 (two) times daily.  . citalopram (CELEXA) 10 MG tablet Take 10 mg by mouth daily.   . ondansetron (ZOFRAN) 4 MG tablet Take 1 tablet (4 mg total) by mouth every 8 (eight) hours as needed.  . [DISCONTINUED] Glatiramer Acetate (COPAXONE) 40 MG/ML SOSY Inject 40 mg into the skin.   No facility-administered encounter medications on file as of 04/20/2017.     Allergies  Allergen Reactions  . Penicillins Other (See Comments)    Unknown childhood reaction    Review of Systems  Constitutional: Positive for fatigue. Negative for appetite change and fever.       Back to baseline  HENT: Negative for congestion and dental problem.   Eyes: Negative for photophobia and visual disturbance.  Respiratory: Negative for cough and shortness of breath.   Cardiovascular: Negative for chest pain and palpitations.  Gastrointestinal: Negative for abdominal distention and  abdominal pain.  Genitourinary: Negative for flank pain.  Musculoskeletal: Negative for back pain and gait problem.  Neurological: Positive for weakness. Negative for dizziness and headaches.       Chronic  Psychiatric/Behavioral: The patient is not nervous/anxious.     BP 116/68 (BP Location: Right Arm, Patient Position: Sitting, Cuff Size: Normal)   Pulse 68   Temp 98.4 F (36.9 C) (Temporal)   Resp 16   Ht 5\' 1"  (1.549 m)   Wt 121 lb (54.9 kg)   LMP 03/26/2017   SpO2 100%   BMI 22.86 kg/m   Physical Exam  Constitutional: She is oriented to person, place, and time. She appears well-developed and well-nourished.  Appears fatigued  HENT:  Head: Normocephalic and atraumatic.  Right Ear: External ear normal.  Left Ear: External ear normal.  Mouth/Throat: Oropharynx is clear and moist.  Eyes: Conjunctivae are normal. Pupils are equal, round, and reactive to light.  Neck: Normal range of motion. Neck supple. No thyromegaly present.  Cardiovascular: Normal rate and regular rhythm.  Murmur heard. Faint systolic murmur audible ar RUSB  Pulmonary/Chest: Effort normal and breath sounds normal. No respiratory distress.  Abdominal: Soft. Bowel sounds are normal.  Musculoskeletal: Normal range of motion. She exhibits no edema.  Lymphadenopathy:    She has no cervical adenopathy.  Neurological: She is alert and oriented to person, place, and time.  Gait normal  Skin: Skin is warm and  dry.  Psychiatric: Her mood appears anxious. Her affect is labile. She expresses impulsivity and inappropriate judgment.  Nursing note and vitals reviewed.   ASSESSMENT/PLAN:  1. Dysuria Resolved  2. Flank pain    Patient Instructions  Continue to drink lots of water Resume copaxone therapy  See me on usual schedule     Eustace MooreYvonne Sue Chrisy Hillebrand, MD

## 2017-04-20 NOTE — Patient Instructions (Addendum)
Continue to drink lots of water Resume copaxone therapy  See me on usual schedule

## 2017-07-19 ENCOUNTER — Ambulatory Visit: Payer: Medicaid Other | Admitting: Family Medicine

## 2017-07-21 ENCOUNTER — Encounter: Payer: Self-pay | Admitting: Family Medicine

## 2017-07-23 ENCOUNTER — Other Ambulatory Visit: Payer: Self-pay | Admitting: Family Medicine

## 2017-08-25 ENCOUNTER — Encounter: Payer: Self-pay | Admitting: Adult Health

## 2017-09-03 ENCOUNTER — Ambulatory Visit: Payer: Medicaid Other | Admitting: Obstetrics & Gynecology

## 2017-09-03 ENCOUNTER — Encounter: Payer: Self-pay | Admitting: Obstetrics & Gynecology

## 2017-09-03 ENCOUNTER — Other Ambulatory Visit (HOSPITAL_COMMUNITY)
Admission: RE | Admit: 2017-09-03 | Discharge: 2017-09-03 | Disposition: A | Discharge status: Home / Self Care | Payer: Medicaid Other | Source: Ambulatory Visit | Attending: Obstetrics & Gynecology | Admitting: Obstetrics & Gynecology

## 2017-09-03 ENCOUNTER — Other Ambulatory Visit: Payer: Self-pay

## 2017-09-03 VITALS — BP 136/88 | HR 110 | Ht 61.0 in | Wt 121.0 lb

## 2017-09-03 DIAGNOSIS — Z124 Encounter for screening for malignant neoplasm of cervix: Secondary | ICD-10-CM

## 2017-09-03 DIAGNOSIS — N870 Mild cervical dysplasia: Secondary | ICD-10-CM

## 2017-09-03 NOTE — Progress Notes (Signed)
Chief Complaint  Patient presents with  . PAP only      27 y.o. [id number] No LMP recorded. Patient has had an implant. The current method of family planning is nexplanon.  Outpatient Encounter Medications as of 09/03/2017  Medication Sig  . citalopram (CELEXA) 20 MG tablet TK 1 T PO QD  . Glatiramer Acetate (COPAXONE) 40 MG/ML SOSY Inject 40 mg into the skin.  . methylphenidate (RITALIN) 10 MG tablet TK 1 T PO IN THE MORNING AND AFTER LUNCH PRN  . Multiple Vitamins-Minerals (MULTIVITAMIN WITH MINERALS) tablet Take by mouth.  . [DISCONTINUED] ondansetron (ZOFRAN) 4 MG tablet Take 1 tablet (4 mg total) by mouth every 8 (eight) hours as needed.  . [DISCONTINUED] ciprofloxacin (CIPRO) 500 MG tablet Take 1 tablet (500 mg total) by mouth 2 (two) times daily.   No facility-administered encounter medications on file as of 09/03/2017.     Subjective Pt had a LSIL Pap 2017 never got follow up Pap smear No complaints On meds for her MS Past Medical History:  Diagnosis Date  . Abnormal Pap smear   . Allergy    mild  . Anemia    post partum  . Anxiety   . Borderline personality disorder (HCC)   . Depression with anxiety   . GERD (gastroesophageal reflux disease)   . Heart murmur    as a baby  . HSV-2 (herpes simplex virus 2) infection   . HSV-2 (herpes simplex virus 2) infection    Suppress @ 34wks:______________   . Hx of chlamydia infection   . Hypertension    Only when pregnant  . Mental disorder    depression,postpartum  . Multiple sclerosis (HCC)   . Optic neuritis    Right  . Postpartum depression 04/03/2013  . Pregnant   . PTSD (post-traumatic stress disorder)   . Right foot drop 04/03/2013   Has - babinski, some muscle weakness  Noted when trying to curl toes  . UTI (lower urinary tract infection) 09/22/2013  . Vitamin D deficiency 02/22/2016    Past Surgical History:  Procedure Laterality Date  . CESAREAN SECTION N/A 01/27/2013   Procedure: CESAREAN  SECTION;  Surgeon: Catalina Antigua, MD;  Location: [facility];  Service: Obstetrics;  Laterality: N/A;  . WISDOM TOOTH EXTRACTION  2012    OB History    Gravida  3   Para  2   Term  1   Preterm  1   AB  1   Living  2     SAB      TAB      Ectopic      Multiple      Live Births  2           Allergies  Allergen Reactions  . Penicillins Other (See Comments)    Unknown childhood reaction    Social History   Socioeconomic History  . Marital status: Single    Spouse name: Not on file  . Number of children: 2  . Years of education: GED+  . Highest education level: Not on file  Occupational History    Comment: unemployed    Comment: disability  Social Needs  . Financial resource strain: Not on file  . Food insecurity:    Worry: Not on file    Inability: Not on file  . Transportation needs:    Medical: Not on file    Non-medical: Not on file  Tobacco Use  . Smoking  status: Former Smoker    Years: 4.00    Types: Cigarettes    Last attempt to quit: 12/14/2010    Years since quitting: 6.7  . Smokeless tobacco: Never Used  Substance and Sexual Activity  . Alcohol use: No    Alcohol/week: 0.0 oz  . Drug use: No  . Sexual activity: Yes    Birth control/protection: Implant  Lifestyle  . Physical activity:    Days per week: Not on file    Minutes per session: Not on file  . Stress: Not on file  Relationships  . Social connections:    Talks on phone: Not on file    Gets together: Not on file    Attends religious service: Not on file    Active member of club or organization: Not on file    Attends meetings of clubs or organizations: Not on file    Relationship status: Not on file  Other Topics Concern  . Not on file  Social History Narrative   Pt lives home with husband ([place]) and two children   Husband unemployed, considers himself her "caretaker"   Pt is right handed   Education GED   Pt may consume one cup of caffeine on a daily basis     Family History  Problem Relation Age of Onset  . Anxiety disorder Mother   . Heart disease Mother   . Heart attack Paternal Aunt   . Cancer Paternal Grandfather        lung  . Stroke Maternal Grandmother   . Breast cancer Maternal Grandmother   . Stroke Maternal Grandfather        aortic aneurysm    Medications:       Current Outpatient Medications:  .  citalopram (CELEXA) 20 MG tablet, TK 1 T PO QD, Disp: , Rfl: 1 .  Glatiramer Acetate (COPAXONE) 40 MG/ML SOSY, Inject 40 mg into the skin., Disp: , Rfl:  .  methylphenidate (RITALIN) 10 MG tablet, TK 1 T PO IN THE MORNING AND AFTER LUNCH PRN, Disp: , Rfl: 0 .  Multiple Vitamins-Minerals (MULTIVITAMIN WITH MINERALS) tablet, Take by mouth., Disp: , Rfl:   Objective Blood pressure 136/88, pulse (!) 110, height 5\' 1"  (1.549 m), weight 121 lb (54.9 kg).  Gen WDWN NAD NEFG vagina clean dry intact Cervix no lesions no erythema  Pertinent ROS No burning with urination, frequency or urgency No nausea, vomiting or diarrhea Nor fever chills or other constitutional symptoms   Labs or studies     Impression Diagnoses this Encounter::   ICD-10-CM   1. 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 Routine cervical smear Z12.4 Cytology - PAP    Established relevant diagnosis(es): Did not get follow up colposcopy 2017  Plan/Recommendations: No orders of the defined types were placed in this encounter.   Labs or Scans Ordered: No orders of the defined types were placed in this encounter.   Management:: Based on Pap results  Follow up No follow-ups on file.       All questions were answered.

## 2017-09-07 LAB — CYTOLOGY - PAP: HPV: DETECTED — AB

## 2017-09-08 ENCOUNTER — Encounter: Payer: Self-pay | Admitting: Obstetrics & Gynecology

## 2017-09-12 ENCOUNTER — Encounter: Payer: Self-pay | Admitting: Obstetrics & Gynecology

## 2017-09-25 ENCOUNTER — Encounter: Payer: Medicaid Other | Admitting: Obstetrics & Gynecology

## 2017-10-04 ENCOUNTER — Encounter: Payer: Self-pay | Admitting: Obstetrics & Gynecology

## 2017-10-04 ENCOUNTER — Ambulatory Visit (INDEPENDENT_AMBULATORY_CARE_PROVIDER_SITE_OTHER): Payer: Medicaid Other | Admitting: Obstetrics & Gynecology

## 2017-10-04 ENCOUNTER — Other Ambulatory Visit: Payer: Self-pay

## 2017-10-04 ENCOUNTER — Other Ambulatory Visit: Payer: Self-pay | Admitting: Obstetrics & Gynecology

## 2017-10-04 VITALS — BP 100/62 | HR 109 | Ht 61.0 in | Wt 121.0 lb

## 2017-10-04 DIAGNOSIS — N870 Mild cervical dysplasia: Secondary | ICD-10-CM | POA: Diagnosis not present

## 2017-10-04 DIAGNOSIS — Z3202 Encounter for pregnancy test, result negative: Secondary | ICD-10-CM

## 2017-10-04 LAB — POCT URINE PREGNANCY: Preg Test, Ur: NEGATIVE

## 2017-10-04 NOTE — Progress Notes (Signed)
Colposcopy Procedure Note:  Colposcopy Procedure Note  Indications: Pap smear 1 months ago showed: low-grade squamous intraepithelial neoplasia (LGSIL - encompassing HPV,mild dysplasia,CIN I). The prior pap showed low-grade squamous intraepithelial neoplasia (LGSIL - encompassing HPV,mild dysplasia,CIN I).  Prior cervical/vaginal disease: . Prior cervical treatment: .  Smoker:  No. New sexual partner:  No.  : time frame:    History of abnormal Pap: yes  Procedure Details  The risks and benefits of the procedure and Written informed consent obtained.  Speculum placed in vagina and excellent visualization of cervix achieved, cervix swabbed x 3 with acetic acid solution.  Findings: Cervix: visible lesion(s) at 6-9 o'clock, acetowhite lesion(s) noted at 6-9 o'clock and punctation noted at 6-9 o'clock; cervical biopsies taken at 7 o'clock. Vaginal inspection: normal without visible lesions. Vulvar colposcopy: vulvar colposcopy not performed.  Specimens: cervical biopsy  Complications: none.  Plan: Specimens labelled and sent to Pathology. Return to discuss Pathology results in will send via MyChart .

## 2017-10-04 NOTE — Addendum Note (Signed)
Addended by: Tish Frederickson A on: 10/04/2017 03:22 PM   Modules accepted: Orders

## 2017-10-09 ENCOUNTER — Encounter: Payer: Self-pay | Admitting: Obstetrics & Gynecology

## 2017-10-11 ENCOUNTER — Encounter: Payer: Self-pay | Admitting: Obstetrics & Gynecology

## 2017-11-14 NOTE — Patient Instructions (Signed)
   Your procedure is scheduled on: 11/28/2017  Report to [facility] at 8:00    AM.  Call this number if you have problems the morning of surgery: [phone]   Remember:   Do not drink or eat food:After Midnight.  :  Take these medicines the morning of surgery with A SIP OF WATER: Celexa, Ritalin, and Propranolol   Do not wear jewelry, make-up or nail polish.  Do not wear lotions, powders, or perfumes. You may wear deodorant.  Do not shave 48 hours prior to surgery. Men may shave face and neck.  Do not bring valuables to the hospital.  Contacts, dentures or bridgework may not be worn into surgery.  Leave suitcase in the car. After surgery it may be brought to your room.  For patients admitted to the hospital, checkout time is 11:00 AM the day of discharge.   Patients discharged the day of surgery will not be allowed to drive home.    Special Instructions: Shower using CHG night before surgery and shower the day of surgery use CHG.  Use special wash - you have one bottle of CHG for all showers.  You should use approximately 1/2 of the bottle for each shower.

## 2017-11-21 ENCOUNTER — Other Ambulatory Visit: Payer: Self-pay | Admitting: Obstetrics & Gynecology

## 2017-11-21 ENCOUNTER — Encounter (HOSPITAL_COMMUNITY)
Admission: RE | Admit: 2017-11-21 | Discharge: 2017-11-21 | Disposition: A | Discharge status: Home / Self Care | Payer: Medicaid Other | Source: Ambulatory Visit | Attending: Obstetrics & Gynecology | Admitting: Obstetrics & Gynecology

## 2017-11-21 ENCOUNTER — Encounter (HOSPITAL_COMMUNITY): Payer: Self-pay

## 2017-11-21 ENCOUNTER — Other Ambulatory Visit: Payer: Self-pay

## 2017-11-21 DIAGNOSIS — Z01812 Encounter for preprocedural laboratory examination: Secondary | ICD-10-CM | POA: Insufficient documentation

## 2017-11-21 DIAGNOSIS — Z01810 Encounter for preprocedural cardiovascular examination: Secondary | ICD-10-CM | POA: Diagnosis not present

## 2017-11-21 DIAGNOSIS — R9431 Abnormal electrocardiogram [ECG] [EKG]: Secondary | ICD-10-CM | POA: Insufficient documentation

## 2017-11-21 LAB — URINALYSIS, ROUTINE W REFLEX MICROSCOPIC
BILIRUBIN URINE: NEGATIVE
GLUCOSE, UA: NEGATIVE mg/dL
HGB URINE DIPSTICK: NEGATIVE
Ketones, ur: NEGATIVE mg/dL
Leukocytes, UA: NEGATIVE
Nitrite: NEGATIVE
Protein, ur: NEGATIVE mg/dL
SPECIFIC GRAVITY, URINE: 1.02 (ref 1.005–1.030)
pH: 5 (ref 5.0–8.0)

## 2017-11-21 LAB — COMPREHENSIVE METABOLIC PANEL
ALBUMIN: 4.1 g/dL (ref 3.5–5.0)
ALT: 18 U/L (ref 0–44)
AST: 19 U/L (ref 15–41)
Alkaline Phosphatase: 44 U/L (ref 38–126)
Anion gap: 9 (ref 5–15)
BUN: 14 mg/dL (ref 6–20)
CHLORIDE: 104 mmol/L (ref 98–111)
CO2: 26 mmol/L (ref 22–32)
Calcium: 8.7 mg/dL — ABNORMAL LOW (ref 8.9–10.3)
Creatinine, Ser: 0.58 mg/dL (ref 0.44–1.00)
GFR calc non Af Amer: >60 mL/min (ref >60)
Glucose, Bld: 89 mg/dL (ref 70–99)
Potassium: 3.5 mmol/L (ref 3.5–5.1)
SODIUM: 139 mmol/L (ref 135–145)
Total Bilirubin: 0.5 mg/dL (ref 0.3–1.2)
Total Protein: 6.9 g/dL (ref 6.5–8.1)

## 2017-11-21 LAB — CBC
HCT: 39.1 % (ref 36.0–46.0)
Hemoglobin: 12.8 g/dL (ref 12.0–15.0)
MCH: 29.4 pg (ref 26.0–34.0)
MCHC: 32.7 g/dL (ref 30.0–36.0)
MCV: 89.9 fL (ref 78.0–100.0)
PLATELETS: 255 10*3/uL (ref 150–400)
RBC: 4.35 MIL/uL (ref 3.87–5.11)
RDW: 12.2 % (ref 11.5–15.5)
WBC: 12.5 10*3/uL — AB (ref 4.0–10.5)

## 2017-11-21 LAB — HCG, QUANTITATIVE, PREGNANCY: hCG, Beta Chain, Quant, S: <1 m[IU]/mL (ref <5)

## 2017-11-21 LAB — RAPID HIV SCREEN (HIV 1/2 AB+AG)
HIV 1/2 Antibodies: NONREACTIVE
HIV-1 P24 ANTIGEN - HIV24: NONREACTIVE

## 2017-11-21 NOTE — Patient Instructions (Signed)
Haley [name]  11/21/2017     @PREFPERIOPPHARMACY @   Your procedure is scheduled on 11/28/2017.  Report to [facility] at 6:45 A.M.  Call this number if you have problems the morning of surgery:  [phone]   Remember:  Do not eat or drink after midnight.      Take these medicines the morning of surgery with A SIP OF WATER Celexa, Ritalin, Propranolol    Do not wear jewelry, make-up or nail polish.  Do not wear lotions, powders, or perfumes, or deodorant.  Do not shave 48 hours prior to surgery.  Men may shave face and neck.  Do not bring valuables to the hospital.  [facility] is not responsible for any belongings or valuables.  Contacts, dentures or bridgework may not be worn into surgery.  Leave your suitcase in the car.  After surgery it may be brought to your room.  For patients admitted to the hospital, discharge time will be determined by your treatment team.  Patients discharged the day of surgery will not be allowed to drive home.    Please read over the following fact sheets that you were given. Surgical Site Infection Prevention and Anesthesia Post-op Instructions     PATIENT INSTRUCTIONS POST-ANESTHESIA  IMMEDIATELY FOLLOWING SURGERY:  Do not drive or operate machinery for the first twenty four hours after surgery.  Do not make any important decisions for twenty four hours after surgery or while taking narcotic pain medications or sedatives.  If you develop intractable nausea and vomiting or a severe headache please notify your doctor immediately.  FOLLOW-UP:  Please make an appointment with your surgeon as instructed. You do not need to follow up with anesthesia unless specifically instructed to do so.  WOUND CARE INSTRUCTIONS (if applicable):  Keep a dry clean dressing on the anesthesia/puncture wound site if there is drainage.  Once the wound has quit draining you may leave it open to air.  Generally you should leave the bandage intact for twenty four  hours unless there is drainage.  If the epidural site drains for more than 36-48 hours please call the anesthesia department.  QUESTIONS?:  Please feel free to call your physician or the hospital operator if you have any questions, and they will be happy to assist you.      Cervical Laser Surgery Cervical laser surgery is a procedure that uses a focused beam of light (laser) to shrink, destroy, or remove tissue from the opening of the uterus (cervix). You may have this surgery if:  You have abnormal cells (dysplasia) in your cervix that are an early sign of cancer.  Tissue from your cervix is to be tested for signs of disease.  Tell a health care provider about:  Any allergies you have.  All medicines you are taking, including vitamins, herbs, eye drops, creams, and over-the-counter medicines.  Any problems you or family members have had with anesthetic medicines.  Any blood disorders you have.  Any surgeries you have had.  Any medical conditions you have.  Whether you are pregnant or may be pregnant. What are the risks? Generally, this is a safe procedure. However, problems may occur, including:  Bleeding.  Infection.  Allergic reactions to medicines or dyes.  Damage to other structures or organs.  Narrowing of the cervix (cervical stenosis). This can lead to infertility, cervical incompetency, and miscarriage or preterm labor in a future pregnancy.  What happens before the procedure?  Ask your health care provider about  changing or stopping your regular medicines. This is especially important if you are taking diabetes medicines or blood thinners.  For at least 24 hours before the procedure: ? Do not have sex. ? Do not use tampons. ? Do not douche. ? Do not use vaginal creams or medicines. What happens during the procedure?  To lower your risk of infection, your health care team will wash or sanitize their hands.  You will lie on your back with your feet raised  in footrests (stirrups).  A device called a speculum will be put into your vagina to hold it open.  You will be given a medicine to numb the area (local anesthetic). You may feel cramping when the medicine is injected.  A magnifying instrument called a colposcope will be placed into your vagina. It will shine a light and allow your health care provider to see your vagina and cervix.  A solution will be swabbed on your cervix to help your health care provider see the tissue.  A thin, flexible tube called an endoscope will be inserted into your vagina. The laser will be on the end of the endoscope.  The laser will be used to shrink, destroy, or remove the tissue.  A cotton swab soaked in solution may be used to remove any burned or destroyed tissue.  A paste may be applied to your cervix to stop bleeding. The procedure may vary among health care providers and hospitals. What happens after the procedure?  You may feel some pain or cramping for a few days.  You may have some bleeding and brownish discharge.  Wear sanitary pads for discharge.  Do not have sex or put anything in your vagina until your health care provider says it is okay.  Your health care provider may recommend that you limit your physical activity for a few days. Ask your health care provider what activities are safe for you. Summary  Cervical laser surgery is a procedure that uses a focused beam of light (laser) to shrink, destroy, or remove tissue from the cervix.  You may have this procedure if you have abnormal cells in your cervix that are an early sign of cancer, or if tissue from your cervix is to be tested for signs of disease.  Generally, this is a safe procedure. However, problems may occur.  Do not have sex, use tampons, douche, or use vaginal creams or medicines for at least 24 hours before the procedure.  You may have pain, cramping, bleeding, and discharge for a few days after the procedure. This  information is not intended to replace advice given to you by your health care provider. Make sure you discuss any questions you have with your health care provider. Document Released: 03/20/2016 Document Revised: 03/20/2016 Document Reviewed: 03/20/2016 Elsevier Interactive Patient Education  [employer].

## 2017-11-25 ENCOUNTER — Encounter: Payer: Self-pay | Admitting: Obstetrics & Gynecology

## 2017-11-28 ENCOUNTER — Other Ambulatory Visit: Payer: Self-pay

## 2017-11-28 ENCOUNTER — Encounter (HOSPITAL_COMMUNITY)
Admission: RE | Disposition: A | Discharge status: Home / Self Care | Payer: Self-pay | Source: Ambulatory Visit | Attending: Obstetrics & Gynecology

## 2017-11-28 ENCOUNTER — Encounter (HOSPITAL_COMMUNITY): Payer: Self-pay | Admitting: *Deleted

## 2017-11-28 ENCOUNTER — Ambulatory Visit (HOSPITAL_COMMUNITY): Payer: Medicaid Other | Admitting: Anesthesiology

## 2017-11-28 ENCOUNTER — Ambulatory Visit (HOSPITAL_COMMUNITY)
Admission: RE | Admit: 2017-11-28 | Discharge: 2017-11-28 | Disposition: A | Discharge status: Home / Self Care | Payer: Medicaid Other | Source: Ambulatory Visit | Attending: Obstetrics & Gynecology | Admitting: Obstetrics & Gynecology

## 2017-11-28 DIAGNOSIS — Z79899 Other long term (current) drug therapy: Secondary | ICD-10-CM | POA: Insufficient documentation

## 2017-11-28 DIAGNOSIS — R87613 High grade squamous intraepithelial lesion on cytologic smear of cervix (HGSIL): Secondary | ICD-10-CM | POA: Insufficient documentation

## 2017-11-28 DIAGNOSIS — Z87891 Personal history of nicotine dependence: Secondary | ICD-10-CM | POA: Diagnosis not present

## 2017-11-28 DIAGNOSIS — G35 Multiple sclerosis: Secondary | ICD-10-CM | POA: Insufficient documentation

## 2017-11-28 DIAGNOSIS — F418 Other specified anxiety disorders: Secondary | ICD-10-CM | POA: Diagnosis not present

## 2017-11-28 DIAGNOSIS — I10 Essential (primary) hypertension: Secondary | ICD-10-CM | POA: Diagnosis not present

## 2017-11-28 HISTORY — PX: CERVICAL ABLATION: SHX5771

## 2017-11-28 SURGERY — ABLATION, CERVIX
Anesthesia: General

## 2017-11-28 MED ORDER — ONDANSETRON HCL 4 MG/2ML IJ SOLN
INTRAMUSCULAR | Status: DC | PRN
  Administered 2017-11-28: 4 mg via INTRAVENOUS

## 2017-11-28 MED ORDER — LACTATED RINGERS IV SOLN
INTRAVENOUS | Status: DC
  Administered 2017-11-28: 09:00:00 via INTRAVENOUS

## 2017-11-28 MED ORDER — FERRIC SUBSULFATE 259 MG/GM EX SOLN
CUTANEOUS | Status: DC | PRN
  Administered 2017-11-28: 1

## 2017-11-28 MED ORDER — PROPOFOL 10 MG/ML IV BOLUS
INTRAVENOUS | Status: AC
  Filled 2017-11-28: qty 20

## 2017-11-28 MED ORDER — LIDOCAINE HCL (PF) 1 % IJ SOLN
INTRAMUSCULAR | Status: AC
  Filled 2017-11-28: qty 5

## 2017-11-28 MED ORDER — PROMETHAZINE HCL 25 MG PO TABS: 25.0000 mg | ORAL_TABLET | Freq: Four times a day (QID) | ORAL | 1 refills | Status: DC | PRN

## 2017-11-28 MED ORDER — HYDROCODONE-ACETAMINOPHEN 7.5-325 MG PO TABS: 1.0000 | ORAL_TABLET | Freq: Once | ORAL | Status: DC | PRN

## 2017-11-28 MED ORDER — MIDAZOLAM HCL 2 MG/2ML IJ SOLN
INTRAMUSCULAR | Status: AC
  Filled 2017-11-28: qty 2

## 2017-11-28 MED ORDER — MIDAZOLAM HCL 2 MG/2ML IJ SOLN
1.0000 mg | INTRAMUSCULAR | Status: DC | PRN
  Administered 2017-11-28: 1 mg via INTRAVENOUS

## 2017-11-28 MED ORDER — LIDOCAINE HCL 1 % IJ SOLN
INTRAMUSCULAR | Status: DC | PRN
  Administered 2017-11-28: 30 mg via INTRADERMAL

## 2017-11-28 MED ORDER — MIDAZOLAM HCL 5 MG/5ML IJ SOLN
INTRAMUSCULAR | Status: DC | PRN
  Administered 2017-11-28: 2 mg via INTRAVENOUS

## 2017-11-28 MED ORDER — HYDROCODONE-ACETAMINOPHEN 5-325 MG PO TABS: 1.0000 | ORAL_TABLET | Freq: Four times a day (QID) | ORAL | 0 refills | Status: DC | PRN

## 2017-11-28 MED ORDER — KETOROLAC TROMETHAMINE 30 MG/ML IJ SOLN
30.0000 mg | Freq: Once | INTRAMUSCULAR | Status: AC
  Administered 2017-11-28: 30 mg via INTRAVENOUS
  Filled 2017-11-28: qty 1

## 2017-11-28 MED ORDER — FENTANYL CITRATE (PF) 250 MCG/5ML IJ SOLN
INTRAMUSCULAR | Status: AC
  Filled 2017-11-28: qty 5

## 2017-11-28 MED ORDER — PROPOFOL 10 MG/ML IV BOLUS
INTRAVENOUS | Status: DC | PRN
  Administered 2017-11-28: 120 mg via INTRAVENOUS

## 2017-11-28 MED ORDER — WATER FOR IRRIGATION, STERILE IR SOLN
Status: DC | PRN
  Administered 2017-11-28: 1000 mL via TOPICAL

## 2017-11-28 MED ORDER — SEVOFLURANE IN SOLN
RESPIRATORY_TRACT | Status: AC
Start: 2017-11-28 — End: ?
  Filled 2017-11-28: qty 250

## 2017-11-28 MED ORDER — FENTANYL CITRATE (PF) 100 MCG/2ML IJ SOLN
INTRAMUSCULAR | Status: AC
  Filled 2017-11-28: qty 2

## 2017-11-28 MED ORDER — ACETIC ACID 5 % SOLN
Status: DC | PRN
  Administered 2017-11-28: 1 via TOPICAL

## 2017-11-28 MED ORDER — GENTAMICIN SULFATE 40 MG/ML IJ SOLN
INTRAVENOUS | Status: AC
  Administered 2017-11-28: 274.5 mg via INTRAVENOUS
  Filled 2017-11-28: qty 6.75

## 2017-11-28 MED ORDER — FENTANYL CITRATE (PF) 100 MCG/2ML IJ SOLN
INTRAMUSCULAR | Status: DC | PRN
  Administered 2017-11-28 (×2): 50 ug via INTRAVENOUS

## 2017-11-28 MED ORDER — FENTANYL CITRATE (PF) 100 MCG/2ML IJ SOLN: 25.0000 ug | INTRAMUSCULAR | Status: DC | PRN

## 2017-11-28 SURGICAL SUPPLY — 30 items
APL FBRTP 16 NS LF PRCTSCP (MISCELLANEOUS) ×1
APL SWBSTK 6 STRL LF DISP (MISCELLANEOUS) ×1
APPLICATOR COTTON TIP 6 STRL (MISCELLANEOUS) ×1 IMPLANT
APPLICATOR COTTON TIP 6IN STRL (MISCELLANEOUS) ×3
CLOTH BEACON ORANGE TIMEOUT ST (SAFETY) ×3 IMPLANT
COVER LIGHT HANDLE STERIS (MISCELLANEOUS) ×6 IMPLANT
COVER MAYO STAND XLG (DRAPE) ×3 IMPLANT
COVER TABLE BACK 60X90 (DRAPES) ×3 IMPLANT
GAUZE SPONGE 4X4 16PLY XRAY LF (GAUZE/BANDAGES/DRESSINGS) ×3 IMPLANT
GLOVE BIOGEL M 6.5 STRL (GLOVE) ×2 IMPLANT
GLOVE BIOGEL PI IND STRL 6.5 (GLOVE) IMPLANT
GLOVE BIOGEL PI IND STRL 7.0 (GLOVE) ×1 IMPLANT
GLOVE BIOGEL PI IND STRL 8 (GLOVE) ×1 IMPLANT
GLOVE BIOGEL PI INDICATOR 6.5 (GLOVE) ×2
GLOVE BIOGEL PI INDICATOR 7.0 (GLOVE) ×2
GLOVE BIOGEL PI INDICATOR 8 (GLOVE) ×2
GLOVE ECLIPSE 8.0 STRL XLNG CF (GLOVE) ×3 IMPLANT
GOWN STRL REUS W/TWL LRG LVL3 (GOWN DISPOSABLE) ×3 IMPLANT
GOWN STRL REUS W/TWL XL LVL3 (GOWN DISPOSABLE) ×3 IMPLANT
KIT TURNOVER KIT A (KITS) ×3 IMPLANT
LASER FIBER DISP 1000U (UROLOGICAL SUPPLIES) ×3 IMPLANT
MANIFOLD NEPTUNE II (INSTRUMENTS) ×3 IMPLANT
MARKER SKIN DUAL TIP RULER LAB (MISCELLANEOUS) ×3 IMPLANT
PAD ARMBOARD 7.5X6 YLW CONV (MISCELLANEOUS) ×3 IMPLANT
PREFILTER SMOKE EVAC (FILTER) ×3 IMPLANT
SET BASIN LINEN APH (SET/KITS/TRAYS/PACK) ×3 IMPLANT
SHEET LAVH (DRAPES) ×3 IMPLANT
SWAB PROCTOSCOPIC (MISCELLANEOUS) ×3 IMPLANT
TUBING SMOKE EVAC CO2 (TUBING) ×3 IMPLANT
WATER STERILE IRR 1000ML POUR (IV SOLUTION) ×3 IMPLANT

## 2017-11-28 NOTE — Op Note (Signed)
Preoperative Diagnosis:  High Grade Squamous Intraepithelial lesion, adequate colposcopy  Postoperative Diagnosis:  Same as above  Procedure:  Laser ablation of the cervix  Surgeon:  Lazaro Arms MD  Anaesthesia:  Laryngeal Mask Airway  Findings:  Patient had an abnormal pap smear which was evaluated in the office with colposcopy and directed biopsies.  Pathology report returned as high Grade SIL.  The colposcopy was adequate.  As a result, the patient is admitted for laser ablation of the cervix.  Description of Note:  Patient was taken to the OR and placed in the supine position where she underwent laryngeal mask airway anaesthesia.  She was placed in the dorsal lithotomy position.  She was draped for laser.  Graves speculum was placed and 3% acetic acid used and the laser microscope employed to perform colposcopy which confirmed the office findings.  Laser was used on typical cervical settings and used to vaporized the squamocolumnar junction to  depth of  5-7 mm peripherally and 7-9 mm centrally.  Surgical margin of several mm was employed beyond the acetowhite epithelium.  Hemostasis was achieved with the laser and Monsel's solution.  Patient was awakened from anaesthesia in good stable condition and all counts were correct.  She received gentamicin and cleocin and Toradol 30 mg IV preoperatively prophylactically.   Lazaro Arms, MD 11/28/2017 12:11 PM

## 2017-11-28 NOTE — Anesthesia Postprocedure Evaluation (Signed)
Anesthesia Post Note Late Entry for 1245  Patient: Haley [name]  Procedure(s) Performed: LASER ABLATION CERVIX (N/A )  Patient location during evaluation: PACU Anesthesia Type: General Level of consciousness: awake and alert and oriented Pain management: pain level controlled Vital Signs Assessment: post-procedure vital signs reviewed and stable Respiratory status: spontaneous breathing Cardiovascular status: stable Postop Assessment: no apparent nausea or vomiting Anesthetic complications: no     Last Vitals:  Vitals:   11/28/17 1246 11/28/17 1249  BP: (!) 99/52 (!) 99/52  Pulse: 75 75  Resp: 15   Temp:    SpO2: 100% 100%    Last Pain:  Vitals:   11/28/17 1249  TempSrc:   PainSc: 0-No pain                 [name] A

## 2017-11-28 NOTE — Anesthesia Preprocedure Evaluation (Signed)
Anesthesia Evaluation  Patient identified by MRN, date of birth, ID band Patient awake    Reviewed: Allergy & Precautions, NPO status , Patient's Chart, lab work & pertinent test results  Airway Mallampati: II  TM Distance: >3 FB Neck ROM: Full    Dental no notable dental hx. (+) Teeth Intact   Pulmonary neg pulmonary ROS, former smoker,    Pulmonary exam normal breath sounds clear to auscultation       Cardiovascular Exercise Tolerance: Good hypertension, Pt. on medications and Pt. on home beta blockers negative cardio ROS Normal cardiovascular examI Rhythm:Regular Rate:Normal  On propranolol for Social anxiety Has MS  Great ET currently    Neuro/Psych Anxiety Depression negative neurological ROS  negative psych ROS   GI/Hepatic negative GI ROS, Neg liver ROS, GERD  ,Denies GERD or meds or SX   Endo/Other  negative endocrine ROS  Renal/GU negative Renal ROS  negative genitourinary   Musculoskeletal negative musculoskeletal ROS (+)   Abdominal   Peds negative pediatric ROS (+)  Hematology negative hematology ROS (+)   Anesthesia Other Findings   Reproductive/Obstetrics negative OB ROS                             Anesthesia Physical Anesthesia Plan  ASA: II  Anesthesia Plan: General   Post-op Pain Management:    Induction: Intravenous  PONV Risk Score and Plan:   Airway Management Planned: LMA  Additional Equipment:   Intra-op Plan:   Post-operative Plan: Extubation in OR  Informed Consent: I have reviewed the patients History and Physical, chart, labs and discussed the procedure including the risks, benefits and alternatives for the proposed anesthesia with the patient or authorized representative who has indicated his/her understanding and acceptance.   Dental advisory given  Plan Discussed with: CRNA  Anesthesia Plan Comments:         Anesthesia Quick  Evaluation

## 2017-11-28 NOTE — Transfer of Care (Signed)
Immediate Anesthesia Transfer of Care Note  Patient: Haley [name]  Procedure(s) Performed: LASER ABLATION CERVIX (N/A )  Patient Location: PACU  Anesthesia Type:General  Level of Consciousness: awake, alert , oriented and patient cooperative  Airway & Oxygen Therapy: Patient Spontanous Breathing  Post-op Assessment: Report given to RN and Post -op Vital signs reviewed and stable  Post vital signs: Reviewed and stable  Last Vitals:  Vitals Value Taken Time  BP 104/83 11/28/2017 12:10 PM  Temp    Pulse 98 11/28/2017 12:12 PM  Resp 15 11/28/2017 12:12 PM  SpO2 100 % 11/28/2017 12:12 PM  Vitals shown include unvalidated device data.  Last Pain:  Vitals:   11/28/17 0714  TempSrc: Oral  PainSc:          Complications: No apparent anesthesia complications

## 2017-11-28 NOTE — Discharge Instructions (Signed)
Cervical Laser Surgery, Care After °This sheet gives you information about how to care for yourself after your procedure. Your health care provider may also give you more specific instructions. If you have problems or questions, contact your health care provider. °What can I expect after the procedure? °After the procedure, it is common to have: °· Pain or discomfort. °· Mild cramping. °· Bleeding, spotting, or brownish discharge from your vagina. ° °Follow these instructions at home: °Activity °· Return to your normal activities as told by your health care provider. Ask your health care provider what activities are safe for you. °· Do not lift anything that is heavier than 10 lb (4.5 kg), or the limit that your health care provider tells you, until he or she says that it is safe. °· Do not have sex or put anything in your vagina until your health care provider says it is okay. °General instructions °· Take over-the-counter and prescription medicines only as told by your health care provider. °· Do not drive or use heavy machinery while taking prescription pain medicine. °· Wear sanitary pads to protect from bleeding, spotting, and discharge. °· Do not use tampons or douche until your health care provider says it is okay. °· It is up to you to get the results of your procedure. Ask your health care provider, or the department that is doing the procedure, when your results will be ready. °· Keep all follow-up visits as told by your health care provider. This is important. °Contact a health care provider if: °· Your pain or cramping does not improve. °· Your periods are more painful than usual. °· You do not get your period as expected. °Get help right away if: °· You have any symptoms of infection, such as: °? A fever. °? Chills. °? Discharge that smells bad. °· You have severe pain in your abdomen. °· You have heavy bleeding from your vagina (more than a normal period). °· You have vaginal bleeding with clumps of  blood (blood clots). °Summary °· After this procedure, it is common to have pain or discomfort and mild cramping. It is also common to have bleeding, spotting, or brownish discharge from your vagina. °· You may need to wear sanitary pads to protect from bleeding, spotting, and discharge. °· Do not have sex, use tampons, or douche until your health care provider says it is okay. °· Return to your normal activities as told by your health care provider. Ask your health care provider what activities are safe for you. °· Take over-the-counter and prescription medicines only as told by your health care provider. These include medicines for pain. °This information is not intended to replace advice given to you by your health care provider. Make sure you discuss any questions you have with your health care provider. °Document Released: 03/20/2016 Document Revised: 03/20/2016 Document Reviewed: 03/20/2016 °Elsevier Interactive Patient Education © 2018 [employer] Inc[employer] ° °

## 2017-11-28 NOTE — Anesthesia Procedure Notes (Signed)
Procedure Name: LMA Insertion Date/Time: 11/28/2017 11:34 AM Performed by: Pernell Dupre, Amy A, CRNA Pre-anesthesia Checklist: Patient identified, Timeout performed, Emergency Drugs available, Suction available and Patient being monitored Patient Re-evaluated:Patient Re-evaluated prior to induction Oxygen Delivery Method: Circle system utilized Preoxygenation: Pre-oxygenation with 100% oxygen Induction Type: IV induction Ventilation: Mask ventilation without difficulty LMA: LMA inserted LMA Size: 3.0 Number of attempts: 1 Placement Confirmation: positive ETCO2 and breath sounds checked- equal and bilateral

## 2017-11-28 NOTE — H&P (Signed)
Preoperative History and Physical  Haley [name] is a 27 y.o. [id number] with No LMP recorded. Patient has had an implant. 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  Due to age an ddesire for future pregnancies, avoid laser conization  PMH:    Past Medical History:  Diagnosis Date  . Abnormal Pap smear   . Allergy    mild  . Anemia    post partum  . Anxiety   . Borderline personality disorder (HCC)   . Depression with anxiety   . GERD (gastroesophageal reflux disease)   . Heart murmur    as a baby  . HSV-2 (herpes simplex virus 2) infection   . HSV-2 (herpes simplex virus 2) infection    Suppress @ 34wks:______________   . Hx of chlamydia infection   . Hypertension    Only when pregnant  . Mental disorder    depression,postpartum  . Multiple sclerosis (HCC)   . Optic neuritis    Right  . Postpartum depression 04/03/2013  . Pregnant   . PTSD (post-traumatic stress disorder)   . Right foot drop 04/03/2013   Has - babinski, some muscle weakness  Noted when trying to curl toes  . UTI (lower urinary tract infection) 09/22/2013  . Vitamin D deficiency 02/22/2016    PSH:     Past Surgical History:  Procedure Laterality Date  . CESAREAN SECTION N/A 01/27/2013   Procedure: CESAREAN SECTION;  Surgeon: Catalina Antigua, MD;  Location: [facility];  Service: Obstetrics;  Laterality: N/A;  . WISDOM TOOTH EXTRACTION  2012    POb/GynH:      OB History    Gravida  3   Para  2   Term  1   Preterm  1   AB  1   Living  2     SAB      TAB      Ectopic      Multiple      Live Births  2           SH:   Social History   Tobacco Use  . Smoking status: Former Smoker    Years: 4.00    Types: Cigarettes    Last attempt to quit: 12/14/2010    Years since quitting: 6.9  . Smokeless tobacco: Never Used  Substance Use Topics  . Alcohol use: No    Alcohol/week: 0.0 oz  . Drug use: No    FH:    Family History  Problem Relation Age of Onset  .  Anxiety disorder Mother   . Heart disease Mother   . Heart attack Paternal Aunt   . Cancer Paternal Grandfather        lung  . Stroke Maternal Grandmother   . Breast cancer Maternal Grandmother   . Stroke Maternal Grandfather        aortic aneurysm     Allergies:  Allergies  Allergen Reactions  . Other     Red meat - facial redness   . Penicillins Other (See Comments)    Unknown childhood reaction Has patient had a PCN reaction causing immediate rash, facial/tongue/throat swelling, SOB or lightheadedness with hypotension: Unknown Has patient had a PCN reaction causing severe rash involving mucus membranes or skin necrosis: Unknown Has patient had a PCN reaction that required hospitalization: Unknown Has patient had a PCN reaction occurring within the last 10 years: Unknown If all of the above answers are "NO", then may proceed with  Cephalosporin use.     Medications:       Current Facility-Administered Medications:  .  gentamicin (GARAMYCIN) 270 mg, clindamycin (CLEOCIN) 900 mg in dextrose 5 % 100 mL IVPB, , Intravenous, On Call to OR, Lazaro Arms, MD .  lactated ringers infusion, , Intravenous, Continuous, Dorena Cookey, MD, Last Rate: 50 mL/hr at 11/28/17 0831 .  midazolam (VERSED) injection 1 mg, 1 mg, Intravenous, Q5 min PRN, Dorena Cookey, MD, 1 mg at 11/28/17 4098  Review of Systems:   Review of Systems  Constitutional: Negative for fever, chills, weight loss, malaise/fatigue and diaphoresis.  HENT: Negative for hearing loss, ear pain, nosebleeds, congestion, sore throat, neck pain, tinnitus and ear discharge.   Eyes: Negative for blurred vision, double vision, photophobia, pain, discharge and redness.  Respiratory: Negative for cough, hemoptysis, sputum production, shortness of breath, wheezing and stridor.   Cardiovascular: Negative for chest pain, palpitations, orthopnea, claudication, leg swelling and PND.  Gastrointestinal: Positive for abdominal pain.  Negative for heartburn, nausea, vomiting, diarrhea, constipation, blood in stool and melena.  Genitourinary: Negative for dysuria, urgency, frequency, hematuria and flank pain.  Musculoskeletal: Negative for myalgias, back pain, joint pain and falls.  Skin: Negative for itching and rash.  Neurological: Negative for dizziness, tingling, tremors, sensory change, speech change, focal weakness, seizures, loss of consciousness, weakness and headaches.  Endo/Heme/Allergies: Negative for environmental allergies and polydipsia. Does not bruise/bleed easily.  Psychiatric/Behavioral: Negative for depression, suicidal ideas, hallucinations, memory loss and substance abuse. The patient is not nervous/anxious and does not have insomnia.      PHYSICAL EXAM:  Blood pressure (!) 93/58, pulse 85, temperature 98.6 F (37 C), temperature source Oral, resp. rate 12, SpO2 98 %.    Vitals reviewed. Constitutional: She is oriented to person, place, and time. She appears well-developed and well-nourished.  HENT:  Head: Normocephalic and atraumatic.  Right Ear: External ear normal.  Left Ear: External ear normal.  Nose: Nose normal.  Mouth/Throat: Oropharynx is clear and moist.  Eyes: Conjunctivae and EOM are normal. Pupils are equal, round, and reactive to light. Right eye exhibits no discharge. Left eye exhibits no discharge. No scleral icterus.  Neck: Normal range of motion. Neck supple. No tracheal deviation present. No thyromegaly present.  Cardiovascular: Normal rate, regular rhythm, normal heart sounds and intact distal pulses.  Exam reveals no gallop and no friction rub.   No murmur heard. Respiratory: Effort normal and breath sounds normal. No respiratory distress. She has no wheezes. She has no rales. She exhibits no tenderness.  GI: Soft. Bowel sounds are normal. She exhibits no distension and no mass. There is tenderness. There is no rebound and no guarding.  Genitourinary:       Vulva is normal  without lesions Vagina is pink moist without discharge Cervix  Colposcopy Procedure Note  Indications: Pap smear 1 months ago showed: low-grade squamous intraepithelial neoplasia (LGSIL - encompassing HPV,mild dysplasia,CIN I). The prior pap showed low-grade squamous intraepithelial neoplasia (LGSIL - encompassing HPV,mild dysplasia,CIN I).  Prior cervical/vaginal disease: . Prior cervical treatment: .  Smoker:  No. New sexual partner:  No.  : time frame:    History of abnormal Pap: yes  Procedure Details  The risks and benefits of the procedure and Written informed consent obtained.  Speculum placed in vagina and excellent visualization of cervix achieved, cervix swabbed x 3 with acetic acid solution.  Findings: Cervix: visible lesion(s) at 6-9 o'clock, acetowhite lesion(s) noted at 6-9 o'clock and punctation noted  at 6-9 o'clock; cervical biopsies taken at 7 o'clock. Vaginal inspection: normal without visible lesions. Vulvar colposcopy: vulvar colposcopy not performed.  Specimens: cervical biopsy  Complications: none.  Plan: Specimens labelled and sent to Pathology. Return to discuss Pathology results in will send via MyChart .  Pathology HSIL, proceed with laser ablation   Uterus is normal size, contour, position, consistency, mobility, non-tender Adnexa is negative with normal sized ovaries by sonogram  Musculoskeletal: Normal range of motion. She exhibits no edema and no tenderness.  Neurological: She is alert and oriented to person, place, and time. She has normal reflexes. She displays normal reflexes. No cranial nerve deficit. She exhibits normal muscle tone. Coordination normal.  Skin: Skin is warm and dry. No rash noted. No erythema. No pallor.  Psychiatric: She has a normal mood and affect. Her behavior is normal. Judgment and thought content normal.    Labs: Results for orders placed or performed during the hospital encounter of 11/21/17 (from the past  336 hour(s))  CBC   Collection Time: 11/21/17  9:10 AM  Result Value Ref Range   WBC 12.5 (H) 4.0 - 10.5 K/uL   RBC 4.35 3.87 - 5.11 MIL/uL   Hemoglobin 12.8 12.0 - 15.0 g/dL   HCT 16.1 09.6 - 04.5 %   MCV 89.9 78.0 - 100.0 fL   MCH 29.4 26.0 - 34.0 pg   MCHC 32.7 30.0 - 36.0 g/dL   RDW 40.9 81.1 - 91.4 %   Platelets 255 150 - 400 K/uL  Comprehensive metabolic panel   Collection Time: 11/21/17  9:10 AM  Result Value Ref Range   Sodium 139 135 - 145 mmol/L   Potassium 3.5 3.5 - 5.1 mmol/L   Chloride 104 98 - 111 mmol/L   CO2 26 22 - 32 mmol/L   Glucose, Bld 89 70 - 99 mg/dL   BUN 14 6 - 20 mg/dL   Creatinine, Ser 7.82 0.44 - 1.00 mg/dL   Calcium 8.7 (L) 8.9 - 10.3 mg/dL   Total Protein 6.9 6.5 - 8.1 g/dL   Albumin 4.1 3.5 - 5.0 g/dL   AST 19 15 - 41 U/L   ALT 18 0 - 44 U/L   Alkaline Phosphatase 44 38 - 126 U/L   Total Bilirubin 0.5 0.3 - 1.2 mg/dL   GFR calc non Af Amer >60 >60 mL/min   GFR calc Af Amer >60 >60 mL/min   Anion gap 9 5 - 15  Rapid HIV screen (HIV 1/2 Ab+Ag)   Collection Time: 11/21/17  9:10 AM  Result Value Ref Range   HIV-1 P24 Antigen - HIV24 NON REACTIVE NON REACTIVE   HIV 1/2 Antibodies NON REACTIVE NON REACTIVE   Interpretation (HIV Ag Ab)      A non reactive test result means that HIV 1 or HIV 2 antibodies and HIV 1 p24 antigen were not detected in the specimen.  hCG, quantitative, pregnancy   Collection Time: 11/21/17  9:11 AM  Result Value Ref Range   hCG, Beta Chain, Quant, S <1 <5 mIU/mL  Urinalysis, Routine w reflex microscopic   Collection Time: 11/21/17  9:11 AM  Result Value Ref Range   Color, Urine YELLOW YELLOW   APPearance HAZY (A) CLEAR   Specific Gravity, Urine 1.020 1.005 - 1.030   pH 5.0 5.0 - 8.0   Glucose, UA NEGATIVE NEGATIVE mg/dL   Hgb urine dipstick NEGATIVE NEGATIVE   Bilirubin Urine NEGATIVE NEGATIVE   Ketones, ur NEGATIVE NEGATIVE mg/dL  Protein, ur NEGATIVE NEGATIVE mg/dL   Nitrite NEGATIVE NEGATIVE   Leukocytes,  UA NEGATIVE NEGATIVE    EKG: Orders placed or performed during the hospital encounter of 11/21/17  . EKG 12-Lead  . EKG 12-Lead    Imaging Studies: No results found.    Assessment: HSIL of the cervix    Patient Active Problem List   Diagnosis Date Noted  . Family history of heart disease in female family member before age 84 10/19/2016  . Depression 06/28/2016  . Vitamin D deficiency 02/22/2016  . Adjustment disorder with mixed anxiety and depressed mood 02/09/2016  . Dysmenorrhea 02/09/2016  . Esophageal reflux 02/09/2016  . Optic neuritis 08/15/2013  . Multiple sclerosis (HCC) 05/02/2013    Plan: Laser ablation of the cervix  Lazaro Arms 11/28/2017 11:21 AM

## 2017-11-29 ENCOUNTER — Encounter (HOSPITAL_COMMUNITY): Payer: Self-pay | Admitting: Obstetrics & Gynecology

## 2017-12-06 ENCOUNTER — Encounter: Payer: Medicaid Other | Admitting: Obstetrics & Gynecology

## 2017-12-10 ENCOUNTER — Encounter: Payer: Medicaid Other | Admitting: Obstetrics & Gynecology

## 2017-12-10 ENCOUNTER — Encounter: Payer: Self-pay | Admitting: Obstetrics & Gynecology

## 2017-12-10 NOTE — Progress Notes (Signed)
Patient wants to leave as she states she is having an IBS flare-up and would feel more comfortable rescheduling.

## 2017-12-14 ENCOUNTER — Ambulatory Visit (INDEPENDENT_AMBULATORY_CARE_PROVIDER_SITE_OTHER): Payer: Medicaid Other | Admitting: Obstetrics & Gynecology

## 2017-12-14 ENCOUNTER — Other Ambulatory Visit: Payer: Self-pay

## 2017-12-14 ENCOUNTER — Encounter: Payer: Self-pay | Admitting: Obstetrics & Gynecology

## 2017-12-14 VITALS — BP 120/74 | HR 97 | Ht 61.0 in | Wt 117.0 lb

## 2017-12-14 DIAGNOSIS — Z9889 Other specified postprocedural states: Secondary | ICD-10-CM | POA: Diagnosis not present

## 2018-01-02 ENCOUNTER — Encounter: Payer: Self-pay | Admitting: Obstetrics & Gynecology

## 2018-01-02 NOTE — Progress Notes (Signed)
This encounter was created in error - please disregard.

## 2018-01-02 NOTE — Progress Notes (Signed)
  HPI: Patient returns for routine postoperative follow-up having undergone laser ablation of the cervix on 11/28/2017.  The patient's immediate postoperative recovery has been unremarkable. Since hospital discharge the patient reports no pain minimal bleeding.   Current Outpatient Medications: aspirin-acetaminophen-caffeine (EXCEDRIN MIGRAINE) 250-250-65 MG tablet, Take 2 tablets by mouth daily as needed for headache., Disp: , Rfl:  citalopram (CELEXA) 20 MG tablet, Take 20 mg by mouth once daily at bedtime as needed for depression, Disp: , Rfl: 1 Glatiramer Acetate (COPAXONE) 40 MG/ML SOSY, Inject 40 mg into the skin 3 (three) times a week. , Disp: , Rfl:  ibuprofen (ADVIL,MOTRIN) 800 MG tablet, Take 800 mg by mouth daily as needed for headache or moderate pain., Disp: , Rfl:  methylphenidate (RITALIN) 10 MG tablet, Take 10 mg by mouth once daily as needed for fatigue, Disp: , Rfl: 0 Multiple Vitamins-Minerals (MULTIVITAMIN WITH MINERALS) tablet, Take 2 tablets by mouth daily. , Disp: , Rfl:  propranolol (INDERAL) 10 MG tablet, Take 5 mg by mouth 3 (three) times daily as needed (anxiety). , Disp: , Rfl: 3  No current facility-administered medications for this visit.     Blood pressure 120/74, pulse 97, height 5\' 1"  (1.549 m), weight 117 lb (53.1 kg).  Physical Exam: Cervical bed is healing well adherent escar no blood  Diagnostic Tests: none  Pathology: None, cx bx HSIL  Impression: S/p laser ablation of the cervix  Plan: No sex for 1 month  Follow up: 6  months for Pap  Lazaro Arms, MD

## 2018-02-14 ENCOUNTER — Other Ambulatory Visit: Payer: Self-pay | Admitting: Obstetrics & Gynecology

## 2018-02-14 MED ORDER — ACYCLOVIR 400 MG PO TABS
400.0000 mg | ORAL_TABLET | Freq: Three times a day (TID) | ORAL | 11 refills | Status: DC
Start: 2018-02-14 — End: 2019-07-14

## 2018-04-02 ENCOUNTER — Encounter: Payer: Self-pay | Admitting: Adult Health

## 2018-04-02 ENCOUNTER — Ambulatory Visit (INDEPENDENT_AMBULATORY_CARE_PROVIDER_SITE_OTHER): Payer: Medicaid Other | Admitting: Adult Health

## 2018-04-02 VITALS — BP 119/79 | HR 91 | Ht 61.0 in | Wt 133.0 lb

## 2018-04-02 DIAGNOSIS — N390 Urinary tract infection, site not specified: Secondary | ICD-10-CM | POA: Diagnosis not present

## 2018-04-02 DIAGNOSIS — R319 Hematuria, unspecified: Secondary | ICD-10-CM

## 2018-04-02 DIAGNOSIS — R300 Dysuria: Secondary | ICD-10-CM

## 2018-04-02 LAB — POCT URINALYSIS DIPSTICK
GLUCOSE UA: NEGATIVE
Ketones, UA: NEGATIVE
LEUKOCYTES UA: NEGATIVE
Nitrite, UA: NEGATIVE
Protein, UA: NEGATIVE

## 2018-04-02 MED ORDER — SULFAMETHOXAZOLE-TRIMETHOPRIM 800-160 MG PO TABS: 1.0000 | ORAL_TABLET | Freq: Two times a day (BID) | ORAL | 0 refills | Status: DC

## 2018-04-02 MED ORDER — PHENAZOPYRIDINE HCL 200 MG PO TABS: 200.0000 mg | ORAL_TABLET | Freq: Three times a day (TID) | ORAL | 0 refills | Status: DC | PRN

## 2018-04-02 NOTE — Progress Notes (Signed)
  Subjective:     Patient ID: Haley [name], female   DOB: [birth year], 27 y.o.   MRN: [id number]  HPI Haley [name] is a 27 year old white female in complaining of burning with urination, like razor blades cutting 2-3 days ago, but better now.   Review of Systems Burning with urination, is better today than 2-3 days ago Had slight fever several days ago Reviewed past medical,surgical, social and family history. Reviewed medications and allergies.     Objective:   Physical Exam BP 119/79 (BP Location: Left Arm, Patient Position: Sitting, Cuff Size: Normal)   Pulse 91   Ht 5\' 1"  (1.549 m)   Wt 133 lb (60.3 kg)   LMP 04/02/2018   BMI 25.13 kg/m urine +blood. Skin warm and dry. Lungs: clear to ausculation bilaterally. Cardiovascular: regular rate and rhythm.No CVAT.    Assessment:     1. Urinary tract infection with hematuria, site unspecified   2. Dysuria       Plan:     Meds ordered this encounter  Medications  . phenazopyridine (PYRIDIUM) 200 MG tablet    Sig: Take 1 tablet (200 mg total) by mouth 3 (three) times daily as needed for pain.    Dispense:  10 tablet    Refill:  0    Order Specific Question:   Supervising Provider    Answer:   Despina Hidden, LUTHER H [[id number]]  . sulfamethoxazole-trimethoprim (BACTRIM DS,SEPTRA DS) 800-160 MG tablet    Sig: Take 1 tablet by mouth 2 (two) times daily. Take 1 bid    Dispense:  14 tablet    Refill:  0    Order Specific Question:   Supervising Provider    Answer:   Despina Hidden, LUTHER H [[id number]]  UA C&S sent Push fluids F/U in 3 months for repeat pap

## 2018-04-03 LAB — MICROSCOPIC EXAMINATION: Casts: NONE SEEN /lpf

## 2018-04-03 LAB — URINALYSIS, ROUTINE W REFLEX MICROSCOPIC
Bilirubin, UA: NEGATIVE
GLUCOSE, UA: NEGATIVE
KETONES UA: NEGATIVE
LEUKOCYTES UA: NEGATIVE
NITRITE UA: NEGATIVE
Protein, UA: NEGATIVE
SPEC GRAV UA: 1.026 (ref 1.005–1.030)
Urobilinogen, Ur: 0.2 mg/dL (ref 0.2–1.0)
pH, UA: 5 (ref 5.0–7.5)

## 2018-04-04 LAB — URINE CULTURE: ORGANISM ID, BACTERIA: NO GROWTH

## 2018-04-25 IMAGING — DX DG CHEST 2V
2 series · 2 of 2 positions shown · non-contrast
Comparison: 11/06/2015

CLINICAL DATA: Dyspnea and pleuritic chest pain for 3 months,
worsened tonight.

EXAM:
CHEST  2 VIEW

[chest pa]
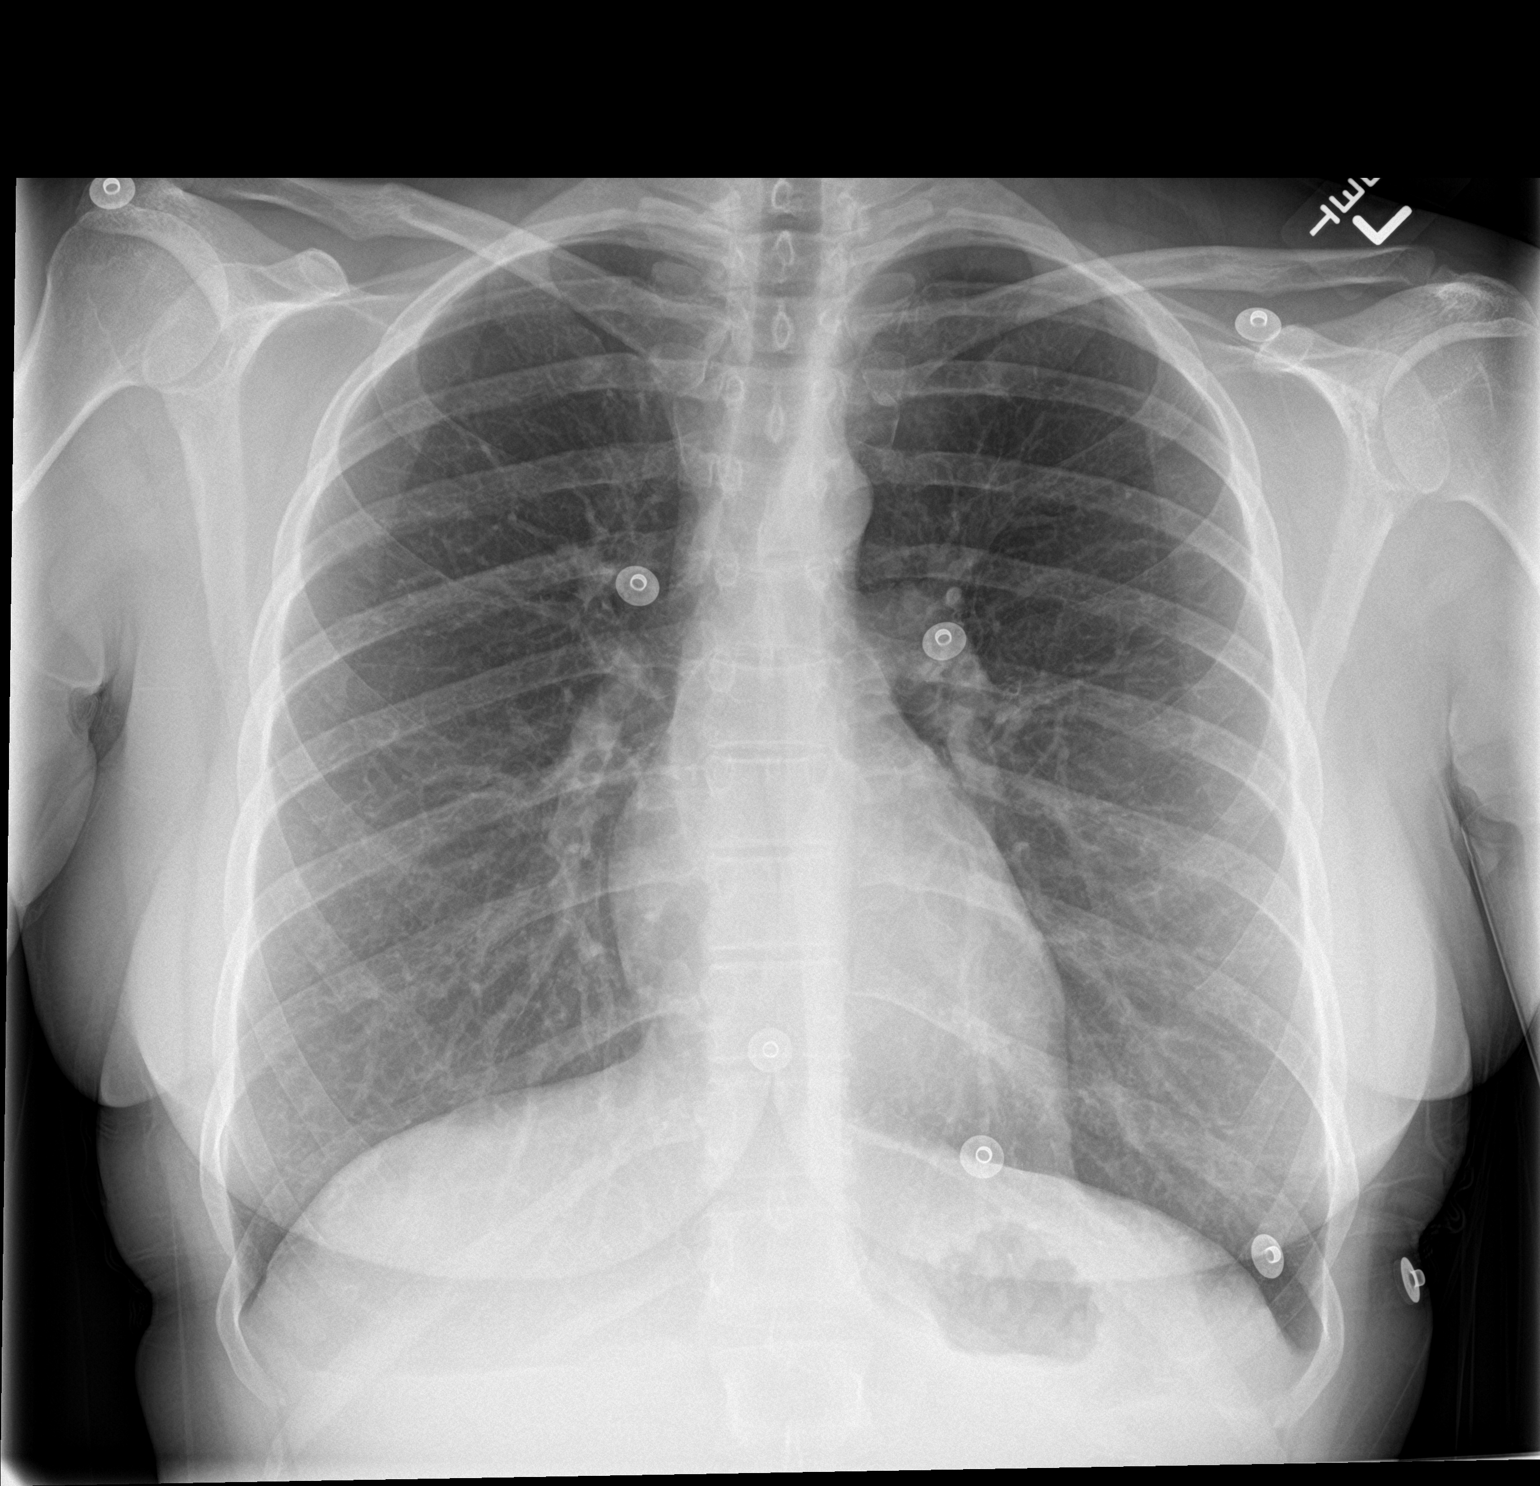

[chest lat]
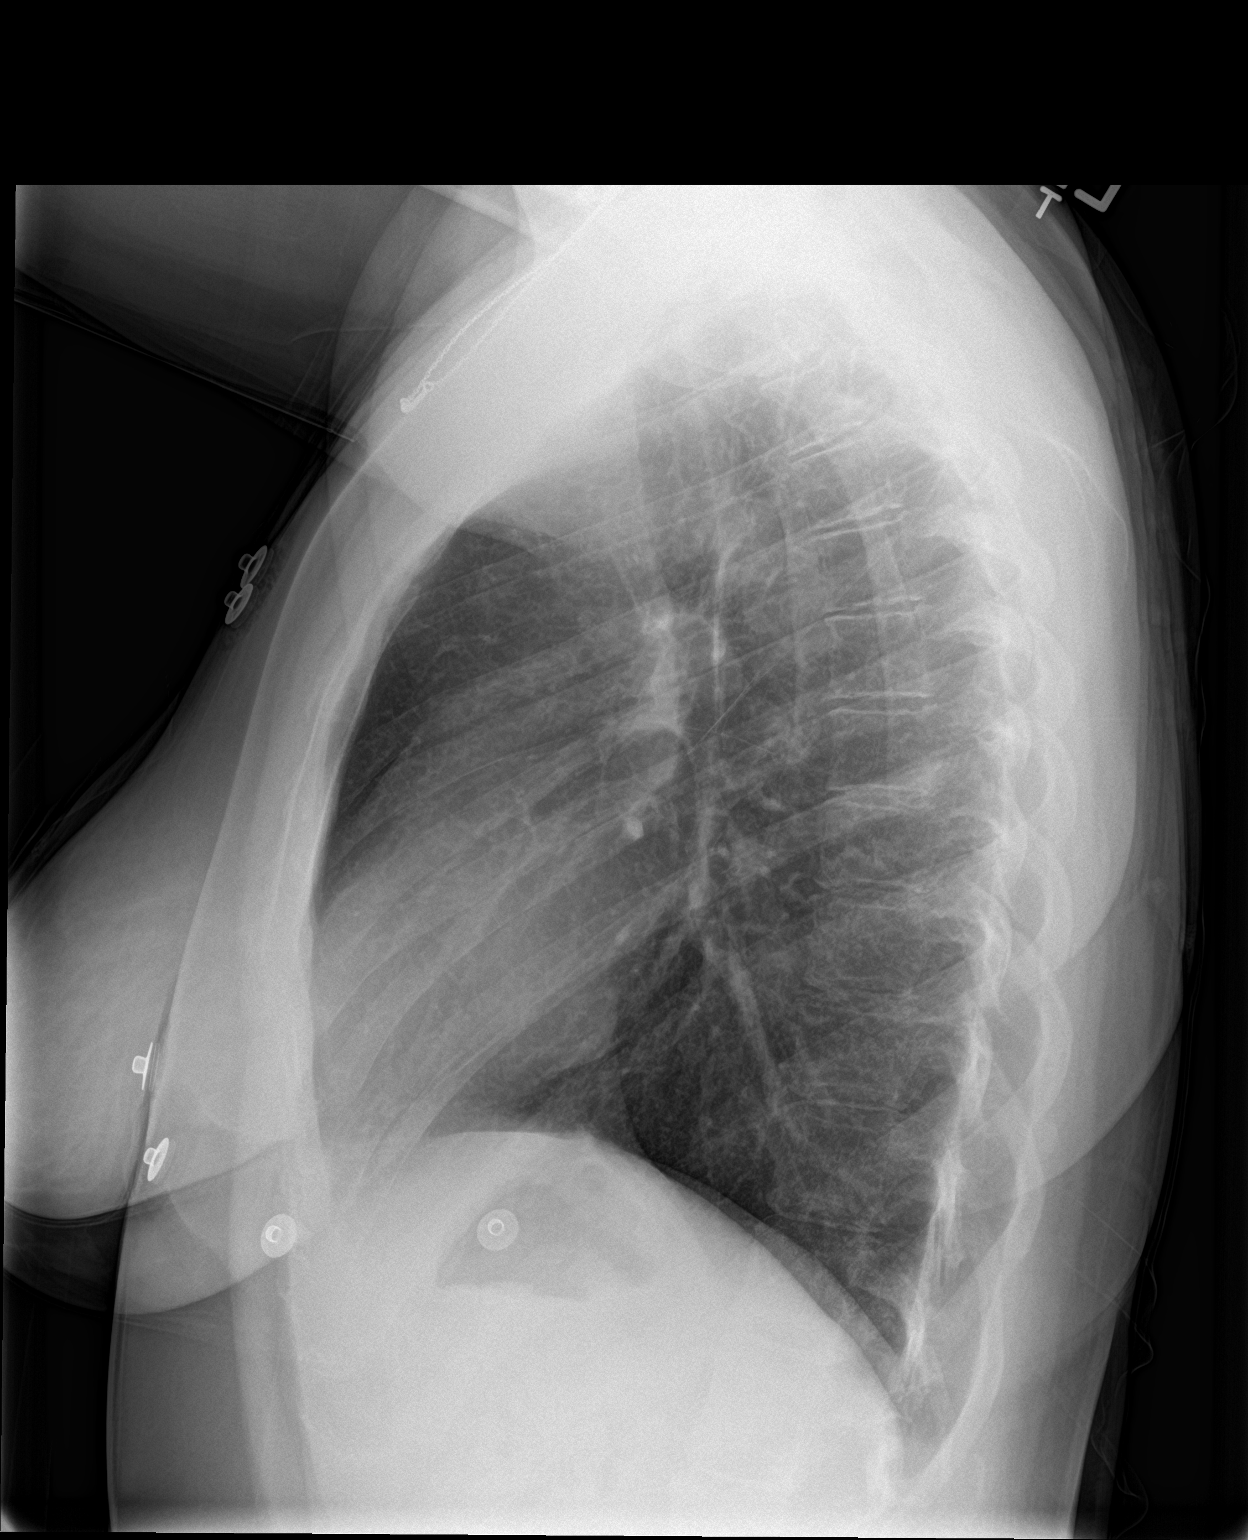

[2 of 2 positions shown; findings below may reference images not displayed]

FINDINGS: The lungs are clear. The pulmonary vasculature is normal. Heart size
is normal. Hilar and mediastinal contours are unremarkable. There is
no pleural effusion.

Incidental findings include a remote healed fracture deformity of
the right clavicle.
IMPRESSION: No active cardiopulmonary disease.

## 2018-06-07 ENCOUNTER — Inpatient Hospital Stay (HOSPITAL_COMMUNITY)
Admission: AD | Admit: 2018-06-07 | Discharge: 2018-06-07 | Disposition: A | Discharge status: Home / Self Care | Payer: Medicaid Other | Attending: Obstetrics and Gynecology | Admitting: Obstetrics and Gynecology

## 2018-06-07 ENCOUNTER — Encounter (HOSPITAL_COMMUNITY): Payer: Self-pay | Admitting: *Deleted

## 2018-06-07 ENCOUNTER — Telehealth: Payer: Self-pay | Admitting: Adult Health

## 2018-06-07 DIAGNOSIS — Z880 Allergy status to penicillin: Secondary | ICD-10-CM | POA: Diagnosis not present

## 2018-06-07 DIAGNOSIS — Z87891 Personal history of nicotine dependence: Secondary | ICD-10-CM | POA: Insufficient documentation

## 2018-06-07 DIAGNOSIS — Z3202 Encounter for pregnancy test, result negative: Secondary | ICD-10-CM | POA: Insufficient documentation

## 2018-06-07 DIAGNOSIS — B349 Viral infection, unspecified: Secondary | ICD-10-CM | POA: Diagnosis not present

## 2018-06-07 DIAGNOSIS — R3121 Asymptomatic microscopic hematuria: Secondary | ICD-10-CM | POA: Diagnosis not present

## 2018-06-07 DIAGNOSIS — R109 Unspecified abdominal pain: Secondary | ICD-10-CM | POA: Diagnosis present

## 2018-06-07 LAB — URINALYSIS, ROUTINE W REFLEX MICROSCOPIC
BILIRUBIN URINE: NEGATIVE
Glucose, UA: NEGATIVE mg/dL
Ketones, ur: NEGATIVE mg/dL
Leukocytes, UA: NEGATIVE
Nitrite: NEGATIVE
PH: 6 (ref 5.0–8.0)
Protein, ur: NEGATIVE mg/dL
SPECIFIC GRAVITY, URINE: 1.02 (ref 1.005–1.030)

## 2018-06-07 LAB — POCT PREGNANCY, URINE: PREG TEST UR: NEGATIVE

## 2018-06-07 NOTE — Telephone Encounter (Signed)
Patient's husband called stating that she left a tampon in for 12 hours and now is having some symptoms of fever and pain. Pt's husband states that patient is trying to avoid ER because of the Flu that is going around. Please contact pt

## 2018-06-07 NOTE — MAU Provider Note (Signed)
[facility] Attending MAU Note  Chief Complaint: Abdominal Pain    First Provider Initiated Contact with Patient 06/07/18 1825      SUBJECTIVE Haley [name] is a 28 y.o. [id number] at Unknown by LMP who presents with  Left tampon in 14 hours. She has had headache and low grade temp despite ibuprofen and tylenol. Not above 100.4. Denies rash. Took tampon out. Has had a red, warm face. LMP 7 days ago. No further bleeding after tampon removed. Has Nexplanon and has irregular cycles. No vaginal irritation. Reports some uneasy feeling in her pelvis. Denies sick contacts. Has had diarrhea, but stomach feels irritated. Notes nausea. Still has appendix and gallbladder. Laser of cervix some months ago. No issues since then.  Past Medical History:  Diagnosis Date  . Abnormal Pap smear   . Allergy    mild  . Anemia    post partum  . Anxiety   . Borderline personality disorder (HCC)   . Depression with anxiety   . GERD (gastroesophageal reflux disease)   . Heart murmur    as a baby  . HSV-2 (herpes simplex virus 2) infection   . HSV-2 (herpes simplex virus 2) infection    Suppress @ 34wks:______________   . Hx of chlamydia infection   . Hypertension    Only when pregnant  . Mental disorder    depression,postpartum  . Multiple sclerosis (HCC)   . Optic neuritis    Right  . Postpartum depression 04/03/2013  . Pregnant   . PTSD (post-traumatic stress disorder)   . Right foot drop 04/03/2013   Has - babinski, some muscle weakness  Noted when trying to curl toes  . UTI (lower urinary tract infection) 09/22/2013  . Vitamin D deficiency 02/22/2016   OB History  Gravida Para Term Preterm AB Living  3 2 1 1 1 2   SAB TAB Ectopic Multiple Live Births          2    # Outcome Date GA Lbr Len/2nd Weight Sex Delivery Anes PTL Lv  3 Preterm 01/27/13 3622w3d   F CS-LTranv Spinal  LIV  2 Term 2009 171w0d  2977 g M Vag-Spont EPI  LIV  1 AB            Past Surgical History:  Procedure  Laterality Date  . CERVICAL ABLATION N/A 11/28/2017   Procedure: LASER ABLATION CERVIX;  Surgeon: Lazaro ArmsEure, Luther H, MD;  Location: [facility];  Service: Gynecology;  Laterality: N/A;  . CESAREAN SECTION N/A 01/27/2013   Procedure: CESAREAN SECTION;  Surgeon: Catalina AntiguaPeggy Constant, MD;  Location: [facility];  Service: Obstetrics;  Laterality: N/A;  . WISDOM TOOTH EXTRACTION  2012   Social History   Socioeconomic History  . Marital status: Single    Spouse name: Not on file  . Number of children: 2  . Years of education: GED+  . Highest education level: Not on file  Occupational History    Comment: unemployed    Comment: disability  Social Needs  . Financial resource strain: Not on file  . Food insecurity:    Worry: Not on file    Inability: Not on file  . Transportation needs:    Medical: Not on file    Non-medical: Not on file  Tobacco Use  . Smoking status: Former Smoker    Years: 4.00    Types: Cigarettes    Last attempt to quit: 12/14/2010    Years since quitting: 7.4  . Smokeless tobacco:  Never Used  Substance and Sexual Activity  . Alcohol use: No    Alcohol/week: 0.0 standard drinks  . Drug use: No  . Sexual activity: Yes    Birth control/protection: Implant  Lifestyle  . Physical activity:    Days per week: Not on file    Minutes per session: Not on file  . Stress: Not on file  Relationships  . Social connections:    Talks on phone: Not on file    Gets together: Not on file    Attends religious service: Not on file    Active member of club or organization: Not on file    Attends meetings of clubs or organizations: Not on file    Relationship status: Not on file  . Intimate partner violence:    Fear of current or ex partner: Not on file    Emotionally abused: Not on file    Physically abused: Not on file    Forced sexual activity: Not on file  Other Topics Concern  . Not on file  Social History Narrative   Pt lives home with husband (Will Herkimer) and two children    Husband unemployed, considers himself her "caretaker"   Pt is right handed   Education GED   Pt may consume one cup of caffeine on a daily basis   No current facility-administered medications on file prior to encounter.    Current Outpatient Medications on File Prior to Encounter  Medication Sig Dispense Refill  . acyclovir (ZOVIRAX) 400 MG tablet Take 1 tablet (400 mg total) by mouth 3 (three) times daily. 90 tablet 11  . aspirin-acetaminophen-caffeine (EXCEDRIN MIGRAINE) 250-250-65 MG tablet Take 2 tablets by mouth daily as needed for headache.    . citalopram (CELEXA) 20 MG tablet Take 20 mg by mouth once daily at bedtime as needed for depression  1  . Glatiramer Acetate (COPAXONE) 40 MG/ML SOSY Inject 40 mg into the skin 3 (three) times a week.     Marland Kitchen ibuprofen (ADVIL,MOTRIN) 800 MG tablet Take 800 mg by mouth daily as needed for headache or moderate pain.    . methylphenidate (RITALIN) 10 MG tablet Take 10 mg by mouth once daily as needed for fatigue  0  . Multiple Vitamins-Minerals (MULTIVITAMIN WITH MINERALS) tablet Take 2 tablets by mouth daily.     . phenazopyridine (PYRIDIUM) 200 MG tablet Take 1 tablet (200 mg total) by mouth 3 (three) times daily as needed for pain. 10 tablet 0  . propranolol (INDERAL) 10 MG tablet Take 5 mg by mouth 3 (three) times daily as needed (anxiety).   3  . sulfamethoxazole-trimethoprim (BACTRIM DS,SEPTRA DS) 800-160 MG tablet Take 1 tablet by mouth 2 (two) times daily. Take 1 bid 14 tablet 0   Allergies  Allergen Reactions  . Other     Red meat - facial redness   . Penicillins Other (See Comments)    Unknown childhood reaction Has patient had a PCN reaction causing immediate rash, facial/tongue/throat swelling, SOB or lightheadedness with hypotension: Unknown Has patient had a PCN reaction causing severe rash involving mucus membranes or skin necrosis: Unknown Has patient had a PCN reaction that required hospitalization: Unknown Has patient had a PCN  reaction occurring within the last 10 years: Unknown If all of the above answers are "NO", then may proceed with Cephalosporin use.     ROS: Pertinent items in HPI  OBJECTIVE BP (!) 143/85 (BP Location: Right Arm)   Pulse 100   Temp 99.2  F (37.3 C) (Oral)   Resp 18   Ht 5\' 1"  (1.549 m)   Wt 64.4 kg   SpO2 100%   BMI 26.83 kg/m  CONSTITUTIONAL: Well-developed, well-nourished female in no acute distress.  HENT:  Normocephalic, atraumatic, External right and left ear normal. Oropharynx is clear and moist EYES: Conjunctivae and EOM are normal.No scleral icterus.  NECK: Normal range of motion, supple, no masses.  Normal thyroid.  SKIN: Skin is warm and dry. No rash noted. Not diaphoretic. No erythema. No pallor. NEUROLGIC: Alert and oriented to person, place, and time. PSYCHIATRIC: Normal mood and affect. Normal behavior. Normal judgment and thought content. CARDIOVASCULAR: Normal heart rate noted RESPIRATORY: Effort and breath sounds normal, no problems with respiration noted. ABDOMEN: Soft, normal bowel sounds, no distention noted.  No tenderness, rebound or guarding.  PELVIC: Normal appearing external genitalia; normal appearing vaginal mucosa and cervix.  Dark brown discharge, Normal uterine size, no other palpable masses, no uterine or adnexal tenderness. MUSCULOSKELETAL: Normal range of motion. No tenderness.  No cyanosis, clubbing, or edema.  2+ distal pulses.  LAB RESULTS Results for orders placed or performed during the hospital encounter of 06/07/18 (from the past 48 hour(s))  Urinalysis, Routine w reflex microscopic     Status: Abnormal   Collection Time: 06/07/18  6:11 PM  Result Value Ref Range   Color, Urine YELLOW YELLOW   APPearance HAZY (A) CLEAR   Specific Gravity, Urine 1.020 1.005 - 1.030   pH 6.0 5.0 - 8.0   Glucose, UA NEGATIVE NEGATIVE mg/dL   Hgb urine dipstick MODERATE (A) NEGATIVE   Bilirubin Urine NEGATIVE NEGATIVE   Ketones, ur NEGATIVE NEGATIVE  mg/dL   Protein, ur NEGATIVE NEGATIVE mg/dL   Nitrite NEGATIVE NEGATIVE   Leukocytes, UA NEGATIVE NEGATIVE   RBC / HPF 0-5 0 - 5 RBC/hpf   WBC, UA 0-5 0 - 5 WBC/hpf   Bacteria, UA RARE (A) NONE SEEN   Squamous Epithelial / LPF 6-10 0 - 5   Mucus PRESENT     Comment: Performed at [facility], [address] [place], [place] [id number]  Pregnancy, urine POC     Status: None   Collection Time: 06/07/18  6:13 PM  Result Value Ref Range   Preg Test, Ur NEGATIVE NEGATIVE    Comment:        THE SENSITIVITY OF THIS METHODOLOGY IS >24 mIU/mL     ASSESSMENT 1. Viral illness   2. Asymptomatic microscopic hematuria     PLAN She has concern for TSS, no rash, no erythema, no MSOF argues strongly against this. She is awake, alert, hypertensive, happy, laughing during exam. Suspect viral illness with some GI (diarrhea) component. Supportive care. Has some abnormalities on UTI, may be left over menses, but per her report, she does not feel UTI, due to MS--will send culture and treat if needed.  Discharge home Follow-up Information    [facility].   Contact information: [address] [place] [id number] [phone]         Allergies as of 06/07/2018      Reactions   Other    Red meat - facial redness    Penicillins Other (See Comments)   Unknown childhood reaction Has patient had a PCN reaction causing immediate rash, facial/tongue/throat swelling, SOB or lightheadedness with hypotension: Unknown Has patient had a PCN reaction causing severe rash involving mucus membranes or skin necrosis: Unknown Has patient had a PCN reaction that  required hospitalization: Unknown Has patient had a PCN reaction occurring within the last 10 years: Unknown If all of the above answers are "NO", then may proceed with Cephalosporin use.      Medication List    TAKE these medications   acyclovir 400 MG tablet Commonly known as:  ZOVIRAX Take 1 tablet  (400 mg total) by mouth 3 (three) times daily.   aspirin-acetaminophen-caffeine 250-250-65 MG tablet Commonly known as:  EXCEDRIN MIGRAINE Take 2 tablets by mouth daily as needed for headache.   citalopram 20 MG tablet Commonly known as:  CELEXA Take 20 mg by mouth once daily at bedtime as needed for depression   COPAXONE 40 MG/ML Sosy Generic drug:  Glatiramer Acetate Inject 40 mg into the skin 3 (three) times a week.   ibuprofen 800 MG tablet Commonly known as:  ADVIL,MOTRIN Take 800 mg by mouth daily as needed for headache or moderate pain.   methylphenidate 10 MG tablet Commonly known as:  RITALIN Take 10 mg by mouth once daily as needed for fatigue   multivitamin with minerals tablet Take 2 tablets by mouth daily.   phenazopyridine 200 MG tablet Commonly known as:  PYRIDIUM Take 1 tablet (200 mg total) by mouth 3 (three) times daily as needed for pain.   propranolol 10 MG tablet Commonly known as:  INDERAL Take 5 mg by mouth 3 (three) times daily as needed (anxiety).   sulfamethoxazole-trimethoprim 800-160 MG tablet Commonly known as:  BACTRIM DS,SEPTRA DS Take 1 tablet by mouth 2 (two) times daily. Take 1 bid        Reva Bores, MD 06/07/2018 6:43 PM

## 2018-06-07 NOTE — Discharge Instructions (Signed)
Viral Illness, Adult °Viruses are tiny germs that can get into a person's body and cause illness. There are many different types of viruses, and they cause many types of illness. Viral illnesses can range from mild to severe. They can affect various parts of the body. °Common illnesses that are caused by a virus include colds and the flu. Viral illnesses also include serious conditions such as HIV/AIDS (human immunodeficiency virus/acquired immunodeficiency syndrome). A few viruses have been linked to certain cancers. °What are the causes? °Many types of viruses can cause illness. Viruses invade cells in your body, multiply, and cause the infected cells to malfunction or die. When the cell dies, it releases more of the virus. When this happens, you develop symptoms of the illness, and the virus continues to spread to other cells. If the virus takes over the function of the cell, it can cause the cell to divide and grow out of control, as is the case when a virus causes cancer. °Different viruses get into the body in different ways. You can get a virus by: °· Swallowing food or water that is contaminated with the virus. °· Breathing in droplets that have been coughed or sneezed into the air by an infected person. °· Touching a surface that has been contaminated with the virus and then touching your eyes, nose, or mouth. °· Being bitten by an insect or animal that carries the virus. °· Having sexual contact with a person who is infected with the virus. °· Being exposed to blood or fluids that contain the virus, either through an open cut or during a transfusion. °If a virus enters your body, your body's defense system (immune system) will try to fight the virus. You may be at higher risk for a viral illness if your immune system is weak. °What are the signs or symptoms? °Symptoms vary depending on the type of virus and the location of the cells that it invades. Common symptoms of the main types of viral illnesses  include: °Cold and flu viruses °· Fever. °· Headache. °· Sore throat. °· Muscle aches. °· Nasal congestion. °· Cough. °Digestive system (gastrointestinal) viruses °· Fever. °· Abdominal pain. °· Nausea. °· Diarrhea. °Liver viruses (hepatitis) °· Loss of appetite. °· Tiredness. °· Yellowing of the skin (jaundice). °Brain and spinal cord viruses °· Fever. °· Headache. °· Stiff neck. °· Nausea and vomiting. °· Confusion or sleepiness. °Skin viruses °· Warts. °· Itching. °· Rash. °Sexually transmitted viruses °· Discharge. °· Swelling. °· Redness. °· Rash. °How is this treated? °Viruses can be difficult to treat because they live within cells. Antibiotic medicines do not treat viruses because these drugs do not get inside cells. Treatment for a viral illness may include: °· Resting and drinking plenty of fluids. °· Medicines to relieve symptoms. These can include over-the-counter medicine for pain and fever, medicines for cough or congestion, and medicines to relieve diarrhea. °· Antiviral medicines. These drugs are available only for certain types of viruses. They may help reduce flu symptoms if taken early. There are also many antiviral medicines for hepatitis and HIV/AIDS. °Some viral illnesses can be prevented with vaccinations. A common example is the flu shot. °Follow these instructions at home: °Medicines ° °· Take over-the-counter and prescription medicines only as told by your health care provider. °· If you were prescribed an antiviral medicine, take it as told by your health care provider. Do not stop taking the medicine even if you start to feel better. °· Be aware of when   antibiotics are needed and when they are not needed. Antibiotics do not treat viruses. If your health care provider thinks that you may have a bacterial infection as well as a viral infection, you may get an antibiotic. °? Do not ask for an antibiotic prescription if you have been diagnosed with a viral illness. That will not make your  illness go away faster. °? Frequently taking antibiotics when they are not needed can lead to antibiotic resistance. When this develops, the medicine no longer works against the bacteria that it normally fights. °General instructions °· Drink enough fluids to keep your urine clear or pale yellow. °· Rest as much as possible. °· Return to your normal activities as told by your health care provider. Ask your health care provider what activities are safe for you. °· Keep all follow-up visits as told by your health care provider. This is important. °How is this prevented? °Take these actions to reduce your risk of viral infection: °· Eat a healthy diet and get enough rest. °· Wash your hands often with soap and water. This is especially important when you are in public places. If soap and water are not available, use hand sanitizer. °· Avoid close contact with friends and family who have a viral illness. °· If you travel to areas where viral gastrointestinal infection is common, avoid drinking water or eating raw food. °· Keep your immunizations up to date. Get a flu shot every year as told by your health care provider. °· Do not share toothbrushes, nail clippers, razors, or needles with other people. °· Always practice safe sex. ° °Contact a health care provider if: °· You have symptoms of a viral illness that do not go away. °· Your symptoms come back after going away. °· Your symptoms get worse. °Get help right away if: °· You have trouble breathing. °· You have a severe headache or a stiff neck. °· You have severe vomiting or abdominal pain. °This information is not intended to replace advice given to you by your health care provider. Make sure you discuss any questions you have with your health care provider. °Document Released: 09/10/2015 Document Revised: 10/13/2015 Document Reviewed: 09/10/2015 °Elsevier Interactive Patient Education © 2019 [employer] ° °

## 2018-06-07 NOTE — Telephone Encounter (Signed)
Discussed patient with Victorino Dike she recommends patient go to ER for evaluation. Called patient back, no answer, left voicemail that we recommend that she go to ER for evaluation.

## 2018-06-07 NOTE — MAU Note (Signed)
Pt reports she had a tampon in for about 14 hours, a few days ago. Reports 2 days later she had a "fever" of 99.3, and it has been elevated (the highest was 99.5) off and on. Also reports diarrhea, headache, and confusion.

## 2018-06-09 LAB — URINE CULTURE
Culture: 10000 — AB
Special Requests: NORMAL

## 2018-07-03 ENCOUNTER — Other Ambulatory Visit: Payer: Medicaid Other | Admitting: Adult Health

## 2018-07-15 ENCOUNTER — Other Ambulatory Visit: Payer: Self-pay | Admitting: Obstetrics & Gynecology

## 2019-03-25 ENCOUNTER — Other Ambulatory Visit: Payer: Self-pay

## 2019-03-25 ENCOUNTER — Other Ambulatory Visit: Payer: Self-pay | Admitting: Obstetrics & Gynecology

## 2019-03-25 ENCOUNTER — Other Ambulatory Visit (INDEPENDENT_AMBULATORY_CARE_PROVIDER_SITE_OTHER): Payer: Medicaid Other | Admitting: *Deleted

## 2019-03-25 DIAGNOSIS — R350 Frequency of micturition: Secondary | ICD-10-CM | POA: Diagnosis not present

## 2019-03-25 LAB — POCT URINALYSIS DIPSTICK
Blood, UA: NEGATIVE
Glucose, UA: NEGATIVE
Leukocytes, UA: NEGATIVE
Nitrite, UA: NEGATIVE
Protein, UA: NEGATIVE

## 2019-03-25 NOTE — Progress Notes (Signed)
Chart reviewed for nurse visit. Agree with plan of care.  Griffin, Jennifer A, NP 03/25/2019 4:55 PM  

## 2019-03-25 NOTE — Progress Notes (Signed)
   NURSE VISIT- UTI SYMPTOMS   SUBJECTIVE:  Haley [name] is a 28 y.o. [phone] female here for UTI symptoms. She is a GYN patient. She reports urinary frequency.  OBJECTIVE:  There were no vitals taken for this visit.  Appears well, in no apparent distress  No results found for this or any previous visit (from the past 24 hour(s)).  ASSESSMENT: GYN patient with UTI symptoms and negative nitrites  PLAN: Note routed to Derrek Monaco, AGNP   Rx sent by provider today: No Urine culture sent Call or return to clinic prn if these symptoms worsen or fail to improve as anticipated. Follow-up: as needed   Rash, Celene Squibb  03/25/2019 4:04 PM

## 2019-03-27 ENCOUNTER — Telehealth: Payer: Self-pay | Admitting: Adult Health

## 2019-03-27 LAB — URINALYSIS, ROUTINE W REFLEX MICROSCOPIC
Bilirubin, UA: NEGATIVE
Glucose, UA: NEGATIVE
Leukocytes,UA: NEGATIVE
Nitrite, UA: NEGATIVE
Protein,UA: NEGATIVE
RBC, UA: NEGATIVE
Specific Gravity, UA: 1.019 (ref 1.005–1.030)
Urobilinogen, Ur: 0.2 mg/dL (ref 0.2–1.0)
pH, UA: 6.5 (ref 5.0–7.5)

## 2019-03-27 LAB — SPECIMEN STATUS REPORT

## 2019-03-27 LAB — URINE CULTURE

## 2019-03-27 NOTE — Telephone Encounter (Signed)
Pt aware that urine + E coli, has started taking keflex

## 2019-04-04 ENCOUNTER — Other Ambulatory Visit: Payer: Medicaid Other

## 2019-04-07 ENCOUNTER — Other Ambulatory Visit: Payer: Medicaid Other

## 2019-04-22 ENCOUNTER — Other Ambulatory Visit: Payer: Medicaid Other

## 2019-04-28 ENCOUNTER — Other Ambulatory Visit (INDEPENDENT_AMBULATORY_CARE_PROVIDER_SITE_OTHER): Payer: Medicaid Other | Admitting: *Deleted

## 2019-04-28 ENCOUNTER — Other Ambulatory Visit: Payer: Self-pay

## 2019-04-28 DIAGNOSIS — R300 Dysuria: Secondary | ICD-10-CM

## 2019-04-28 DIAGNOSIS — R3915 Urgency of urination: Secondary | ICD-10-CM | POA: Diagnosis not present

## 2019-04-28 LAB — POCT URINALYSIS DIPSTICK OB
Blood, UA: NEGATIVE
Glucose, UA: NEGATIVE
Leukocytes, UA: NEGATIVE
Nitrite, UA: NEGATIVE
POC,PROTEIN,UA: NEGATIVE

## 2019-04-28 NOTE — Progress Notes (Signed)
Chart reviewed for nurse visit. Agree with plan of care.  Estill Dooms, NP 04/28/2019 3:43 PM

## 2019-04-28 NOTE — Progress Notes (Signed)
   NURSE VISIT- UTI SYMPTOMS   SUBJECTIVE:  Haley [name] is a 28 y.o. [phone] female here for UTI symptoms. She is a GYN patient. She reports urinary frequency.  OBJECTIVE:  There were no vitals taken for this visit.  Appears well, in no apparent distress  No results found for this or any previous visit (from the past 24 hour(s)).  ASSESSMENT: GYN patient with UTI symptoms and negative nitrites  PLAN: Note routed to Haley [name], AGNP   Rx sent by provider today: No Urine culture sent Call or return to clinic prn if these symptoms worsen or fail to improve as anticipated. Follow-up: as scheduled   Haley [name], Celene Squibb  04/28/2019 3:15 PM

## 2019-05-01 LAB — URINALYSIS, ROUTINE W REFLEX MICROSCOPIC
Bilirubin, UA: NEGATIVE
Glucose, UA: NEGATIVE
Leukocytes,UA: NEGATIVE
Nitrite, UA: NEGATIVE
Protein,UA: NEGATIVE
RBC, UA: NEGATIVE
Specific Gravity, UA: 1.009 (ref 1.005–1.030)
Urobilinogen, Ur: 0.2 mg/dL (ref 0.2–1.0)
pH, UA: 6 (ref 5.0–7.5)

## 2019-05-01 LAB — URINE CULTURE

## 2019-05-01 LAB — SPECIMEN STATUS REPORT

## 2019-05-21 ENCOUNTER — Other Ambulatory Visit: Payer: Self-pay

## 2019-05-21 ENCOUNTER — Ambulatory Visit: Payer: Medicaid Other | Attending: Internal Medicine

## 2019-05-21 DIAGNOSIS — Z20822 Contact with and (suspected) exposure to covid-19: Secondary | ICD-10-CM

## 2019-05-23 LAB — NOVEL CORONAVIRUS, NAA: SARS-CoV-2, NAA: NOT DETECTED

## 2019-06-05 ENCOUNTER — Other Ambulatory Visit: Payer: Medicaid Other | Admitting: Adult Health

## 2019-06-09 ENCOUNTER — Other Ambulatory Visit: Payer: Medicaid Other | Admitting: Adult Health

## 2019-07-02 ENCOUNTER — Ambulatory Visit
Admission: EM | Admit: 2019-07-02 | Discharge: 2019-07-02 | Disposition: A | Discharge status: Home / Self Care | Payer: Medicaid Other

## 2019-07-02 ENCOUNTER — Other Ambulatory Visit: Payer: Self-pay

## 2019-07-02 DIAGNOSIS — R509 Fever, unspecified: Secondary | ICD-10-CM

## 2019-07-02 DIAGNOSIS — Z20822 Contact with and (suspected) exposure to covid-19: Secondary | ICD-10-CM

## 2019-07-02 NOTE — ED Triage Notes (Signed)
Pt needs covid test after positive exposure last week, pt has MS and had infusion last week and developed low grade fever

## 2019-07-02 NOTE — ED Provider Notes (Signed)
[facility]    CSN: [id number] Arrival date & time: 07/02/19  1537      History   Chief Complaint Chief Complaint  Patient presents with  . Fever    HPI Haley [name] is a 29 y.o. female.   Who presented to the urgent care with a  complaint of Covid exposure last week.  Reported she is currently having fever.  Denies sick exposure to  flu or strep.  Denies recent travel.  Denies aggravating or alleviating symptoms.  Denies previous COVID infection.   Denies fatigue, nasal congestion, rhinorrhea, sore throat, cough, SOB, wheezing, chest pain, nausea, vomiting, changes in bowel or bladder habits.    The history is provided by the patient. No language interpreter was used.    Past Medical History:  Diagnosis Date  . Abnormal Pap smear   . Allergy    mild  . Anemia    post partum  . Anxiety   . Borderline personality disorder ([place])   . Depression with anxiety   . GERD (gastroesophageal reflux disease)   . Heart murmur    as a baby  . HSV-2 (herpes simplex virus 2) infection   . HSV-2 (herpes simplex virus 2) infection    Suppress @ 34wks:______________   . Hx of chlamydia infection   . Hypertension    Only when pregnant  . Mental disorder    depression,postpartum  . Multiple sclerosis ([place])   . Optic neuritis    Right  . Postpartum depression 04/03/2013  . Pregnant   . PTSD (post-traumatic stress disorder)   . Right foot drop 04/03/2013   Has - babinski, some muscle weakness  Noted when trying to curl toes  . UTI (lower urinary tract infection) 09/22/2013  . Vitamin D deficiency 02/22/2016    Patient Active Problem List   Diagnosis Date Noted  . Family history of heart disease in female family member before age 37 10/19/2016  . Depression 06/28/2016  . Vitamin D deficiency 02/22/2016  . Adjustment disorder with mixed anxiety and depressed mood 02/09/2016  . Dysmenorrhea 02/09/2016  . Esophageal reflux 02/09/2016  . Optic neuritis 08/15/2013  .  Multiple sclerosis ([place]) 05/02/2013    Past Surgical History:  Procedure Laterality Date  . CERVICAL ABLATION N/A 11/28/2017   Procedure: LASER ABLATION CERVIX;  Surgeon: Florian Buff, MD;  Location: [facility];  Service: Gynecology;  Laterality: N/A;  . CESAREAN SECTION N/A 01/27/2013   Procedure: CESAREAN SECTION;  Surgeon: Mora Bellman, MD;  Location: [facility];  Service: Obstetrics;  Laterality: N/A;  . WISDOM TOOTH EXTRACTION  2012    OB History    Gravida  3   Para  2   Term  1   Preterm  1   AB  1   Living  2     SAB      TAB      Ectopic      Multiple      Live Births  2            Home Medications    Prior to Admission medications   Medication Sig Start Date End Date Taking? Authorizing Provider  acyclovir (ZOVIRAX) 400 MG tablet Take 1 tablet (400 mg total) by mouth 3 (three) times daily. 02/14/18   Florian Buff, MD  aspirin-acetaminophen-caffeine (EXCEDRIN MIGRAINE) [phone] MG tablet Take 2 tablets by mouth daily as needed for headache.    [provider]  cephALEXin (KEFLEX) 500 MG  capsule TAKE ONE CAPSULE BY MOUTH TWICE DAILY FOR 7 DAYS 03/25/19   Lazaro Arms, MD  citalopram (CELEXA) 20 MG tablet Take 20 mg by mouth once daily at bedtime as needed for depression 06/15/17   [provider]  Glatiramer Acetate (COPAXONE) 40 MG/ML SOSY Inject 40 mg into the skin 3 (three) times a week.  04/18/17   [provider]  ibuprofen (ADVIL,MOTRIN) 800 MG tablet Take 800 mg by mouth daily as needed for headache or moderate pain.    [provider]  LATUDA 20 MG TABS tablet Take 20 mg by mouth daily. 03/28/19   [provider]  methylphenidate (RITALIN) 10 MG tablet Take 10 mg by mouth once daily as needed for fatigue 06/27/17   [provider]  Multiple Vitamins-Minerals (MULTIVITAMIN WITH MINERALS) tablet Take 2 tablets by mouth daily.     [provider]  phenazopyridine (PYRIDIUM) 200 MG tablet Take  1 tablet (200 mg total) by mouth 3 (three) times daily as needed for pain. 04/02/18   Adline Potter, NP  propranolol (INDERAL) 10 MG tablet Take 5 mg by mouth 3 (three) times daily as needed (anxiety).  10/30/17   [provider]  sulfamethoxazole-trimethoprim (BACTRIM DS,SEPTRA DS) 800-160 MG tablet Take 1 tablet by mouth 2 (two) times daily. Take 1 bid 04/02/18   Adline Potter, NP    Family History Family History  Problem Relation Age of Onset  . Anxiety disorder Mother   . Heart disease Mother   . Heart attack Paternal Aunt   . Cancer Paternal Grandfather        lung  . Stroke Maternal Grandmother   . Breast cancer Maternal Grandmother   . Stroke Maternal Grandfather        aortic aneurysm    Social History Social History   Tobacco Use  . Smoking status: Former Smoker    Years: 4.00    Types: Cigarettes    Quit date: 12/14/2010    Years since quitting: 8.5  . Smokeless tobacco: Never Used  Substance Use Topics  . Alcohol use: No    Alcohol/week: 0.0 standard drinks  . Drug use: No     Allergies   Other and Penicillins   Review of Systems Review of Systems  Constitutional: Positive for chills and fever.  HENT: Negative.   Respiratory: Negative.   Cardiovascular: Negative.   Gastrointestinal: Negative.   Neurological: Negative.      Physical Exam Triage Vital Signs ED Triage Vitals  Enc Vitals Group     BP      Pulse      Resp      Temp      Temp src      SpO2      Weight      Height      Head Circumference      Peak Flow      Pain Score      Pain Loc      Pain Edu?      Excl. in GC?    No data found.  Updated Vital Signs There were no vitals taken for this visit.  Visual Acuity Right Eye Distance:   Left Eye Distance:   Bilateral Distance:    Right Eye Near:   Left Eye Near:    Bilateral Near:     Physical Exam Vitals and nursing note reviewed.  Constitutional:      General: She is not in acute  distress.     Appearance: Normal appearance. She is normal weight. She is not ill-appearing or toxic-appearing.  HENT:     Head: Normocephalic.     Right Ear: Tympanic membrane, ear canal and external ear normal. There is no impacted cerumen.     Left Ear: Tympanic membrane, ear canal and external ear normal. There is no impacted cerumen.     Nose: Nose normal. No congestion.     Mouth/Throat:     Mouth: Mucous membranes are moist.     Pharynx: Oropharynx is clear. No oropharyngeal exudate or posterior oropharyngeal erythema.  Cardiovascular:     Rate and Rhythm: Regular rhythm. Tachycardia present.     Pulses: Normal pulses.     Heart sounds: Normal heart sounds. No murmur.  Pulmonary:     Effort: Pulmonary effort is normal. No respiratory distress.     Breath sounds: Normal breath sounds. No wheezing or rhonchi.  Chest:     Chest wall: No tenderness.  Neurological:     General: No focal deficit present.     Mental Status: She is alert and oriented to person, place, and time.      UC Treatments / Results  Labs (all labs ordered are listed, but only abnormal results are displayed) Labs Reviewed - No data to display  EKG   Radiology No results found.  Procedures Procedures (including critical care time)  Medications Ordered in UC Medications - No data to display  Initial Impression / Assessment and Plan / UC Course  I have reviewed the triage vital signs and the nursing notes.  Pertinent labs & imaging results that were available during my care of the patient were reviewed by me and considered in my medical decision making (see chart for details).   Patient is stable for discharge. COVID-19 test was ordered Advised patient to quarantine Go to ED for worsening of symptoms Patient verbalized understanding of the plan of care.   Final Clinical Impressions(s) / UC Diagnoses   Final diagnoses:  Fever with exposure to COVID-19 virus     Discharge Instructions     COVID  testing ordered.  It will take between 2-7 days for test results.  Someone will contact you regarding abnormal results.    In the meantime: You should remain isolated in your home for 10 days from symptom onset AND greater than 72 hours after symptoms resolution (absence of fever without the use of fever-reducing medication and improvement in respiratory symptoms), whichever is longer Get plenty of rest and push fluids Use medications daily for symptom relief Use OTC medications like ibuprofen or tylenol as needed fever or pain Call or go to the ED if you have any new or worsening symptoms such as fever, worsening cough, shortness of breath, chest tightness, chest pain, turning blue, changes in mental status, etc...     ED Prescriptions    None     PDMP not reviewed this encounter.   Durward Parcel, FNP 07/02/19 1600

## 2019-07-02 NOTE — Discharge Instructions (Addendum)

## 2019-07-04 LAB — NOVEL CORONAVIRUS, NAA: SARS-CoV-2, NAA: NOT DETECTED

## 2019-07-14 ENCOUNTER — Other Ambulatory Visit: Payer: Self-pay | Admitting: Adult Health

## 2019-07-14 MED ORDER — ACYCLOVIR 400 MG PO TABS: 400.0000 mg | ORAL_TABLET | Freq: Three times a day (TID) | ORAL | 11 refills | Status: DC

## 2019-07-14 NOTE — Progress Notes (Signed)
Refilled zovirax  

## 2019-07-17 ENCOUNTER — Encounter: Payer: Self-pay | Admitting: Adult Health

## 2019-07-17 ENCOUNTER — Other Ambulatory Visit (HOSPITAL_COMMUNITY)
Admission: RE | Admit: 2019-07-17 | Discharge: 2019-07-17 | Disposition: A | Discharge status: Home / Self Care | Payer: Medicaid Other | Source: Ambulatory Visit | Attending: Adult Health | Admitting: Adult Health

## 2019-07-17 ENCOUNTER — Other Ambulatory Visit: Payer: Self-pay

## 2019-07-17 ENCOUNTER — Ambulatory Visit (INDEPENDENT_AMBULATORY_CARE_PROVIDER_SITE_OTHER): Payer: Medicaid Other | Admitting: Adult Health

## 2019-07-17 VITALS — BP 114/80 | HR 83 | Ht 61.0 in | Wt 123.0 lb

## 2019-07-17 DIAGNOSIS — Z0000 Encounter for general adult medical examination without abnormal findings: Secondary | ICD-10-CM | POA: Diagnosis not present

## 2019-07-17 DIAGNOSIS — R350 Frequency of micturition: Secondary | ICD-10-CM

## 2019-07-17 DIAGNOSIS — Z01419 Encounter for gynecological examination (general) (routine) without abnormal findings: Secondary | ICD-10-CM | POA: Diagnosis not present

## 2019-07-17 DIAGNOSIS — Z8742 Personal history of other diseases of the female genital tract: Secondary | ICD-10-CM | POA: Insufficient documentation

## 2019-07-17 DIAGNOSIS — Z975 Presence of (intrauterine) contraceptive device: Secondary | ICD-10-CM | POA: Insufficient documentation

## 2019-07-17 LAB — POCT URINALYSIS DIPSTICK
Blood, UA: NEGATIVE
Glucose, UA: NEGATIVE
Leukocytes, UA: NEGATIVE
Protein, UA: POSITIVE — AB

## 2019-07-17 NOTE — Progress Notes (Signed)
Patient ID: Haley [name], female   DOB: 1990/09/21, 29 y.o.   MRN: [id number] History of Present Illness: Haley [name] is a 29 year old white female, single, [id number], in for well woman gyn exam and pap. She has nexplanon and it is due to be removed, next month and she wants another inserted.She has MS and has started orevus.  Last pap was LSIL with +HPV 09/03/17, had colpo 10/04/17 was high and low grade dysplasia and had laser ablation 11/28/17 PCP is Dr Margo Aye.   Current Medications, Allergies, Past Medical History, Past Surgical History, Family History and Social History were reviewed in [employer] record.     Review of Systems: Patient denies any headaches, hearing loss, fatigue, blurred vision, shortness of breath, chest pain, abdominal pain, problems with bowel movements,  or intercourse. No joint pain or mood swings. +urinary frequency    Physical Exam:BP 114/80 (BP Location: Left Arm, Patient Position: Sitting, Cuff Size: Normal)   Pulse 83   Ht 5\' 1"  (1.549 m)   Wt 123 lb (55.8 kg)   LMP  (LMP Unknown)   BMI 23.24 kg/m urine dipstick trace protein large ketones General:  Well developed, well nourished, no acute distress Skin:  Warm and dry Neck:  Midline trachea, normal thyroid, good ROM, no lymphadenopathy Lungs; Clear to auscultation bilaterally Breast:  No dominant palpable mass, retraction, or nipple discharge Cardiovascular: Regular rate and rhythm Abdomen:  Soft, non tender, no hepatosplenomegaly Pelvic:  External genitalia is normal in appearance, no lesions.  The vagina is normal in appearance. Urethra has no lesions or masses. The cervix is smooth and bulbous,.pap with high risk HPV 16/18 genotyping performed.  Uterus is felt to be normal size, shape, and contour.  No adnexal masses or tenderness noted.Bladder is non tender, no masses felt. Extremities/musculoskeletal:  No swelling or varicosities noted, no clubbing or cyanosis Psych:  No mood changes,  alert and cooperative,seems happy Fall risk is low PHQ 2 score is 1. Examination chaperoned by Rash LPN  Impression and plan:  1. Encounter for gynecological examination with Papanicolaou smear of cervix Pap sent Physical in 1 year Pap in 3 if normal  2. History of abnormal cervical Pap smear   3. Nexplanon in place Return 4/21 for nexplanon removal and reisnertion  4. Urinary frequency

## 2019-07-18 LAB — CYTOLOGY - PAP
Adequacy: ABSENT
Comment: NEGATIVE
Diagnosis: NEGATIVE
High risk HPV: NEGATIVE

## 2019-09-04 ENCOUNTER — Other Ambulatory Visit: Payer: Self-pay

## 2019-09-04 ENCOUNTER — Ambulatory Visit (INDEPENDENT_AMBULATORY_CARE_PROVIDER_SITE_OTHER): Payer: Medicaid Other | Admitting: Adult Health

## 2019-09-04 ENCOUNTER — Encounter: Payer: Self-pay | Admitting: Adult Health

## 2019-09-04 VITALS — BP 136/92 | HR 74 | Ht 61.0 in | Wt 120.0 lb

## 2019-09-04 DIAGNOSIS — Z3046 Encounter for surveillance of implantable subdermal contraceptive: Secondary | ICD-10-CM | POA: Diagnosis not present

## 2019-09-04 DIAGNOSIS — Z3202 Encounter for pregnancy test, result negative: Secondary | ICD-10-CM | POA: Diagnosis not present

## 2019-09-04 LAB — POCT URINE PREGNANCY: Preg Test, Ur: NEGATIVE

## 2019-09-04 MED ORDER — ETONOGESTREL 68 MG ~~LOC~~ IMPL
68.0000 mg | DRUG_IMPLANT | Freq: Once | SUBCUTANEOUS | Status: AC
  Administered 2019-09-04: 68 mg via SUBCUTANEOUS

## 2019-09-04 NOTE — Patient Instructions (Signed)
Use condoms x 2 weeks, keep clean and dry x 24 hours, no heavy lifting, keep steri strips on x 72 hours, Keep pressure dressing on x 24 hours. Follow up prn problems.  

## 2019-09-04 NOTE — Addendum Note (Signed)
Addended by: Colen Darling on: 09/04/2019 03:46 PM   Modules accepted: Orders

## 2019-09-04 NOTE — Progress Notes (Signed)
  Subjective:     Patient ID: Haley [name], female   DOB: Sep 08, 1990, 29 y.o.   MRN: [id number]  HPI Haley [name] is a 29 year old white female,single, [id number], in for nexplanon removal and reinsertion. PCP is Dr. Margo Aye.  Review of Systems For nexplanon removal and reinsertion Partner to get vasectomy in future Reviewed past medical,surgical, social and family history. Reviewed medications and allergies.     Objective:   Physical Exam BP (!) 136/92 (BP Location: Left Arm, Patient Position: Sitting, Cuff Size: Normal)   Pulse 74   Ht 5\' 1"  (1.549 m)   Wt 120 lb (54.4 kg)   LMP 08/17/2019 (Approximate)   BMI 22.67 kg/m UPT is negative. Consent signed, time out called Left arm cleansed with betadine, and injected with 1.5 cc 2% lidocaine and waited til numb.Under sterile technique a #11 blade was used to make small vertical incision, and a curved forceps was used to easily remove rod. then new rod easily inserted and palpated by provider and pt.Steri strips applied. Pressure dressing applied.    Assessment:     1. Pregnancy examination or test, negative result  2. Encounter for removal and reinsertion of Nexplanon Lot # 10/17/2019 Exp [id number]    Plan:     Use condoms x 2 weeks, keep clean and dry x 24 hours, no heavy lifting, keep steri strips on x 72 hours, Keep pressure dressing on x 24 hours. Follow up prn problems.   Remove in 3 years or sooner if desired.

## 2020-07-06 ENCOUNTER — Encounter: Payer: Self-pay | Admitting: Adult Health

## 2020-07-06 ENCOUNTER — Other Ambulatory Visit: Payer: Self-pay

## 2020-07-06 ENCOUNTER — Ambulatory Visit (INDEPENDENT_AMBULATORY_CARE_PROVIDER_SITE_OTHER): Payer: Medicaid Other | Admitting: Adult Health

## 2020-07-06 VITALS — BP 125/78 | HR 83 | Ht 61.0 in | Wt 109.0 lb

## 2020-07-06 DIAGNOSIS — Z3046 Encounter for surveillance of implantable subdermal contraceptive: Secondary | ICD-10-CM | POA: Diagnosis not present

## 2020-07-06 NOTE — Progress Notes (Signed)
  Subjective:     Patient ID: Haley [name], female   DOB: 04/08/1991, 30 y.o.   MRN: [id number]  HPI [name] is a 30 year old white female, single, [id number] in for nexplanon removal, partner had vasectomy. PCP is Dr Margo Aye.  Review of Systems For nexplanon removal,partner had vasectomy  Reviewed past medical,surgical, social and family history. Reviewed medications and allergies.     Objective:   Physical Exam BP 125/78 (BP Location: Right Arm, Patient Position: Sitting, Cuff Size: Normal)   Pulse 83   Ht 5\' 1"  (1.549 m)   Wt 109 lb (49.4 kg)   BMI 20.60 kg/m   Consent signed and time out called. Left arm cleansed with betadine, and injected with 2 cc 2% lidocaine and waited til numb.Under sterile technique a #11 blade was used to make small vertical incision, and a curved forceps was used to remove rod. Steri strips applied. Pressure dressing applied.   Fall risk is low.   [facility] - 07/06/20 1121      Pregnancy Intention Screening   Does the patient want to become pregnant in the next year? No    Does the patient's partner want to become pregnant in the next year? No    Would the patient like to discuss contraceptive options today? No      Contraception Wrap Up   Current Method Hormonal Implant   vasectomy   End Method Vasectomy    Contraception Counseling Provided No          Assessment:      1. Encounter for Nexplanon removal     Plan:     - keep clean and dry x 24 hours, no heavy lifting, keep steri strips on x 72 hours, Keep pressure dressing on x 24 hours. Follow up prn problems.   H as physical appt 08/19/20

## 2020-07-06 NOTE — Patient Instructions (Signed)
keep clean and dry x 24 hours, no heavy lifting, keep steri strips on x 72 hours, Keep pressure dressing on x 24 hours. Follow up prn problems.  °

## 2020-08-02 ENCOUNTER — Other Ambulatory Visit: Payer: Self-pay | Admitting: Adult Health

## 2020-08-02 MED ORDER — ACYCLOVIR 400 MG PO TABS: 400.0000 mg | ORAL_TABLET | Freq: Three times a day (TID) | ORAL | 11 refills | Status: DC

## 2020-08-02 NOTE — Progress Notes (Signed)
Refill acyclovir

## 2020-08-19 ENCOUNTER — Other Ambulatory Visit: Payer: Self-pay

## 2020-08-19 ENCOUNTER — Ambulatory Visit (INDEPENDENT_AMBULATORY_CARE_PROVIDER_SITE_OTHER): Payer: Medicaid Other | Admitting: Adult Health

## 2020-08-19 ENCOUNTER — Encounter: Payer: Self-pay | Admitting: Adult Health

## 2020-08-19 VITALS — BP 114/82 | HR 93 | Ht 61.0 in | Wt 108.0 lb

## 2020-08-19 DIAGNOSIS — F419 Anxiety disorder, unspecified: Secondary | ICD-10-CM | POA: Diagnosis not present

## 2020-08-19 DIAGNOSIS — F32A Depression, unspecified: Secondary | ICD-10-CM

## 2020-08-19 DIAGNOSIS — Z01419 Encounter for gynecological examination (general) (routine) without abnormal findings: Secondary | ICD-10-CM | POA: Diagnosis not present

## 2020-08-19 NOTE — Progress Notes (Signed)
Patient ID: Haley [name], female   DOB: [birth year], 30 y.o.   MRN: [id number] History of Present Illness:  Haley [name] is a 30 year old white female, single,[id number], in for a well woman gyn exam, she had a normal pap with negative HRHPV, 07/17/2019. PCP is Dr Margo Aye.   Current Medications, Allergies, Past Medical History, Past Surgical History, Family History and Social History were reviewed in [employer] record.     Review of Systems: Patient denies any headaches, hearing loss, fatigue, blurred vision, shortness of breath, chest pain, abdominal pain, problems with bowel movements, urination, or intercourse. No joint pain or mood swings. Has felt better since having nexplanon removed, says brain fog lifted     Physical Exam:BP 114/82 (BP Location: Left Arm, Patient Position: Sitting, Cuff Size: Normal)   Pulse 93   Ht 5\' 1"  (1.549 m)   Wt 108 lb (49 kg)   LMP 08/03/2020 (Exact Date)   BMI 20.41 kg/m  General:  Well developed, well nourished, no acute distress Skin:  Warm and dry Neck:  Midline trachea, normal thyroid, good ROM, no lymphadenopathy Lungs; Clear to auscultation bilaterally Breast:  No dominant palpable mass, retraction, or nipple discharge Cardiovascular: Regular rate and rhythm Abdomen:  Soft, non tender, no hepatosplenomegaly Pelvic:  External genitalia is normal in appearance, no lesions.  The vagina is normal in appearance. Urethra has no lesions or masses. The cervix is bulbous.  Uterus is felt to be normal size, shape, and contour.  No adnexal masses or tenderness noted.Bladder is non tender, no masses felt. Extremities/musculoskeletal:  No swelling or varicosities noted, no clubbing or cyanosis Psych:  No mood changes, alert and cooperative,seems happy AA is 0 Fall risk is low PHQ 9 score is 14, no SI or HI, declines meds GAD 7 score is 19  [facility] - 08/19/20 1340      Pregnancy Intention Screening   Does the patient want to become pregnant  in the next year? No    Does the patient's partner want to become pregnant in the next year? No    Would the patient like to discuss contraceptive options today? No      Contraception Wrap Up   Current Method Vasectomy    End Method Vasectomy    Contraception Counseling Provided No         Examination chaperoned by 10/19/20 LPN   Impression and Plan: 1. Encounter for well woman exam with routine gynecological exam Physical in 1 year Pap in 2024 Labs with PCP   2. Anxiety and depression She declines meds, call therapist

## 2021-03-17 ENCOUNTER — Telehealth: Payer: Self-pay | Admitting: Adult Health

## 2021-03-17 MED ORDER — CIPROFLOXACIN HCL 500 MG PO TABS: 500.0000 mg | ORAL_TABLET | Freq: Two times a day (BID) | ORAL | 0 refills | Status: DC

## 2021-03-17 NOTE — Addendum Note (Signed)
Addended by: Cyril Mourning A on: 03/17/2021 05:16 PM   Modules accepted: Orders

## 2021-03-17 NOTE — Telephone Encounter (Signed)
Patient calling stating that she has MS , she is having urgency , freq burning and states that she normally has a standing order for refills and it has expired and she is getting a infusion on the 10th and will not be able to get one if she has a infection asking if something can be sent to [facility] [address]

## 2021-03-17 NOTE — Telephone Encounter (Signed)
Has burning and frequency, Hx UTI, will  cipro

## 2021-05-05 ENCOUNTER — Other Ambulatory Visit: Payer: Self-pay | Admitting: Adult Health

## 2021-05-06 ENCOUNTER — Other Ambulatory Visit: Payer: Self-pay | Admitting: Obstetrics & Gynecology

## 2021-05-06 MED ORDER — CIPROFLOXACIN HCL 500 MG PO TABS: 500.0000 mg | ORAL_TABLET | Freq: Two times a day (BID) | ORAL | 0 refills | Status: DC

## 2021-05-23 ENCOUNTER — Other Ambulatory Visit: Payer: Self-pay | Admitting: Obstetrics & Gynecology

## 2021-05-23 MED ORDER — CIPROFLOXACIN HCL 500 MG PO TABS: 500.0000 mg | ORAL_TABLET | Freq: Two times a day (BID) | ORAL | 0 refills | Status: DC

## 2021-05-25 ENCOUNTER — Other Ambulatory Visit (INDEPENDENT_AMBULATORY_CARE_PROVIDER_SITE_OTHER): Payer: Medicaid Other

## 2021-05-25 ENCOUNTER — Other Ambulatory Visit: Payer: Self-pay | Admitting: Adult Health

## 2021-05-25 ENCOUNTER — Other Ambulatory Visit: Payer: Self-pay

## 2021-05-25 DIAGNOSIS — R3915 Urgency of urination: Secondary | ICD-10-CM

## 2021-05-25 DIAGNOSIS — R1030 Lower abdominal pain, unspecified: Secondary | ICD-10-CM

## 2021-05-25 DIAGNOSIS — R300 Dysuria: Secondary | ICD-10-CM | POA: Diagnosis not present

## 2021-05-25 LAB — POCT URINALYSIS DIPSTICK
Blood, UA: NEGATIVE
Glucose, UA: NEGATIVE
Ketones, UA: NEGATIVE
Leukocytes, UA: NEGATIVE
Nitrite, UA: POSITIVE
Protein, UA: NEGATIVE

## 2021-05-25 MED ORDER — FLUCONAZOLE 150 MG PO TABS: ORAL_TABLET | ORAL | 1 refills | Status: DC

## 2021-05-25 MED ORDER — PHENAZOPYRIDINE HCL 200 MG PO TABS: 200.0000 mg | ORAL_TABLET | Freq: Three times a day (TID) | ORAL | 0 refills | Status: DC | PRN

## 2021-05-25 NOTE — Progress Notes (Signed)
Rx diflucan.  

## 2021-05-25 NOTE — Progress Notes (Addendum)
° °  NURSE VISIT- UTI SYMPTOMS   SUBJECTIVE:  Haley [name] is a 31 y.o. [phone] female here for UTI symptoms. She is a GYN patient. She reports dysuria, lower abdominal pain, and urinary urgency. Was prescribed Cipro and has been taking for 3 days but does not feel like symptoms are getting better.  OBJECTIVE:  There were no vitals taken for this visit.  Appears well, in no apparent distress  Results for orders placed or performed in visit on 05/25/21 (from the past 24 hour(s))  POCT Urinalysis Dipstick   Collection Time: 05/25/21  4:04 PM  Result Value Ref Range   Color, UA     Clarity, UA     Glucose, UA Negative Negative   Bilirubin, UA     Ketones, UA neg    Spec Grav, UA     Blood, UA neg    pH, UA     Protein, UA Negative Negative   Urobilinogen, UA     Nitrite, UA positive    Leukocytes, UA Negative Negative   Appearance     Odor      ASSESSMENT: GYN patient with UTI symptoms and positive nitrites  PLAN: Discussed with Cyril Mourning, AGNP   Rx sent by provider today: No, currently taking Cipro. Will wait for sensitivities.  Urine culture sent Call or return to clinic prn if these symptoms worsen or fail to improve as anticipated. Follow-up: as needed   Jobe Marker  05/25/2021 4:05 PM Chart reviewed for nurse visit. Agree with plan of care. Rx sent for pyridium  Adline Potter, NP 05/25/2021 4:24 PM

## 2021-05-25 NOTE — Addendum Note (Signed)
Addended by: Derrek Monaco A on: 05/25/2021 04:24 PM   Modules accepted: Orders

## 2021-05-26 ENCOUNTER — Other Ambulatory Visit: Payer: Self-pay | Admitting: Adult Health

## 2021-05-26 LAB — MICROSCOPIC EXAMINATION
Bacteria, UA: NONE SEEN
Casts: NONE SEEN /lpf

## 2021-05-26 LAB — URINALYSIS, ROUTINE W REFLEX MICROSCOPIC
Bilirubin, UA: NEGATIVE
Glucose, UA: NEGATIVE
Ketones, UA: NEGATIVE
Leukocytes,UA: NEGATIVE
Nitrite, UA: POSITIVE — AB
Protein,UA: NEGATIVE
RBC, UA: NEGATIVE
Specific Gravity, UA: 1.023 (ref 1.005–1.030)
Urobilinogen, Ur: 1 mg/dL (ref 0.2–1.0)
pH, UA: 7 (ref 5.0–7.5)

## 2021-05-26 MED ORDER — SULFAMETHOXAZOLE-TRIMETHOPRIM 800-160 MG PO TABS: 1.0000 | ORAL_TABLET | Freq: Two times a day (BID) | ORAL | 0 refills | Status: DC

## 2021-05-26 NOTE — Progress Notes (Signed)
Will rx septra ds  

## 2021-05-27 LAB — URINE CULTURE: Organism ID, Bacteria: NO GROWTH

## 2021-08-15 ENCOUNTER — Other Ambulatory Visit (HOSPITAL_COMMUNITY): Payer: Self-pay | Admitting: Family Medicine

## 2021-08-15 ENCOUNTER — Other Ambulatory Visit: Payer: Self-pay | Admitting: Family Medicine

## 2021-08-15 DIAGNOSIS — R195 Other fecal abnormalities: Secondary | ICD-10-CM

## 2021-08-31 ENCOUNTER — Ambulatory Visit (HOSPITAL_COMMUNITY): Payer: Medicaid Other

## 2021-08-31 ENCOUNTER — Encounter (HOSPITAL_COMMUNITY): Payer: Self-pay

## 2021-09-06 ENCOUNTER — Other Ambulatory Visit (HOSPITAL_COMMUNITY): Payer: Self-pay | Admitting: Family Medicine

## 2021-09-06 DIAGNOSIS — K5900 Constipation, unspecified: Secondary | ICD-10-CM

## 2021-09-07 ENCOUNTER — Ambulatory Visit (HOSPITAL_COMMUNITY)
Admission: RE | Admit: 2021-09-07 | Discharge: 2021-09-07 | Disposition: A | Discharge status: Home / Self Care | Payer: Medicaid Other | Source: Ambulatory Visit | Attending: Family Medicine | Admitting: Family Medicine

## 2021-09-07 DIAGNOSIS — K5900 Constipation, unspecified: Secondary | ICD-10-CM | POA: Insufficient documentation

## 2021-09-19 ENCOUNTER — Ambulatory Visit (INDEPENDENT_AMBULATORY_CARE_PROVIDER_SITE_OTHER): Payer: Medicaid Other | Admitting: Adult Health

## 2021-09-19 ENCOUNTER — Encounter: Payer: Self-pay | Admitting: Adult Health

## 2021-09-19 VITALS — BP 109/71 | HR 98 | Ht 61.0 in | Wt 109.0 lb

## 2021-09-19 DIAGNOSIS — F32A Depression, unspecified: Secondary | ICD-10-CM

## 2021-09-19 DIAGNOSIS — F419 Anxiety disorder, unspecified: Secondary | ICD-10-CM

## 2021-09-19 DIAGNOSIS — Z01419 Encounter for gynecological examination (general) (routine) without abnormal findings: Secondary | ICD-10-CM | POA: Diagnosis not present

## 2021-09-19 NOTE — Progress Notes (Signed)
Patient ID: Haley [name], female   DOB: 1990-05-18, 31 y.o.   MRN: [id number] ?History of Present Illness: ?Haley [name] is a 31 year old white female, with SO, [id number] in for a yearly GYN exam. ?Lab Results  ?Component Value Date  ? DIAGPAP  07/17/2019  ?  - Negative for intraepithelial lesion or malignancy (NILM)  ? HPV DETECTED (A) 09/03/2017  ? HPVHIGH Negative 07/17/2019  ?  ?PCP is Dr Margo Aye. ? ?Current Medications, Allergies, Past Medical History, Past Surgical History, Family History and Social History were reviewed in [employer] record.   ? ? ?Review of Systems: ?Patient denies any headaches, hearing loss, fatigue, blurred vision, shortness of breath, chest pain, abdominal pain, problems with bowel movements, urination, or intercourse. No joint pain. ?She is moody,emotions up and down ? ? ? ?Physical Exam:BP 109/71 (BP Location: Right Arm, Patient Position: Sitting, Cuff Size: Normal)   Pulse 98   Ht 5\' 1"  (1.549 m)   Wt 109 lb (49.4 kg)   LMP 09/06/2021   BMI 20.60 kg/m?   ?General:  Well developed, well nourished, no acute distress ?Skin:  Warm and dry ?Neck:  Midline trachea, normal thyroid, good ROM, no lymphadenopathy ?Lungs; Clear to auscultation bilaterally ?Breast:  No dominant palpable mass, retraction, or nipple discharge ?Cardiovascular: Regular rate and rhythm ?Abdomen:  Soft, non tender, no hepatosplenomegaly ?Pelvic:  External genitalia is normal in appearance, no lesions.  The vagina is normal in appearance. Urethra has no lesions or masses. The cervix is bulbous.  Uterus is felt to be normal size, shape, and contour.  No adnexal masses or tenderness noted.Bladder is non tender, no masses felt. ?Extremities/musculoskeletal:  No swelling or varicosities noted, no clubbing or cyanosis ?Psych: alert and cooperative,seems happy ?AA is 0 ?Fall risk is low ? ?  09/19/2021  ? 11:44 AM 08/19/2020  ?  1:41 PM 07/17/2019  ?  9:41 AM  ?Depression screen PHQ 2/9  ?Decreased Interest 2 2 0   ?Down, Depressed, Hopeless 3 1 1   ?PHQ - 2 Score 5 3 1   ?Altered sleeping 3 2   ?Tired, decreased energy 2 1   ?Change in appetite 1 1   ?Feeling bad or failure about yourself  3 3   ?Trouble concentrating 2 2   ?Moving slowly or fidgety/restless 3 1   ?Suicidal thoughts 2 1   ?PHQ-9 Score 21 14   ? She is not on meds and has no intentions of hurting her self, is on waiting list for [facility] therapy ? ?  09/19/2021  ? 11:44 AM 08/19/2020  ?  1:41 PM  ?GAD 7 : Generalized Anxiety Score  ?Nervous, Anxious, on Edge 3 3  ?Control/stop worrying 3 3  ?Worry too much - different things 3 3  ?Trouble relaxing 3 3  ?Restless 3 2  ?Easily annoyed or irritable 3 3  ?Afraid - awful might happen 3 2  ?Total GAD 7 Score 21 19  ? ?  ? [facility] - 09/19/21 1144   ? ?  ? Pregnancy Intention Screening  ? Does the patient want to become pregnant in the next year? No   ? Does the patient's partner want to become pregnant in the next year? No   ? Would the patient like to discuss contraceptive options today? No   ?  ? Contraception Wrap Up  ? Current Method Vasectomy   ? End Method Vasectomy   ? Contraception Counseling Provided No   ? ?  ?  ? ?  ?  ?  Examination chaperoned by Faith Rogue LPN ? ?Impression and Plan: ?1. Encounter for annual routine gynecological examination ?Pap and physical in 1 year ?Labs with PCP ? ? ?2. Anxiety and depression ?She denies any intentions of hurting self ?Follow up with PCP ?On waiting list for [facility] therapy with Wallace Cullens, she used to be at [facility] [place] says  ? ? ? ?  ?  ?

## 2022-04-20 ENCOUNTER — Other Ambulatory Visit: Payer: Self-pay | Admitting: Adult Health

## 2022-04-20 MED ORDER — ACYCLOVIR 400 MG PO TABS: 400.0000 mg | ORAL_TABLET | Freq: Three times a day (TID) | ORAL | 11 refills | Status: DC

## 2022-04-20 NOTE — Progress Notes (Signed)
Refilled zovirax  

## 2023-04-19 ENCOUNTER — Other Ambulatory Visit (HOSPITAL_COMMUNITY)
Admission: RE | Admit: 2023-04-19 | Discharge: 2023-04-19 | Disposition: A | Discharge status: Home / Self Care | Payer: Medicaid Other | Source: Ambulatory Visit | Attending: Adult Health | Admitting: Adult Health

## 2023-04-19 ENCOUNTER — Encounter: Payer: Self-pay | Admitting: Adult Health

## 2023-04-19 ENCOUNTER — Ambulatory Visit: Payer: Medicaid Other | Admitting: Adult Health

## 2023-04-19 VITALS — BP 126/82 | HR 82 | Ht 61.0 in | Wt 104.5 lb

## 2023-04-19 DIAGNOSIS — F419 Anxiety disorder, unspecified: Secondary | ICD-10-CM

## 2023-04-19 DIAGNOSIS — Z1331 Encounter for screening for depression: Secondary | ICD-10-CM

## 2023-04-19 DIAGNOSIS — F32A Depression, unspecified: Secondary | ICD-10-CM

## 2023-04-19 DIAGNOSIS — Z0000 Encounter for general adult medical examination without abnormal findings: Secondary | ICD-10-CM | POA: Diagnosis present

## 2023-04-19 DIAGNOSIS — Z01419 Encounter for gynecological examination (general) (routine) without abnormal findings: Secondary | ICD-10-CM | POA: Insufficient documentation

## 2023-04-19 MED ORDER — CITALOPRAM HYDROBROMIDE 10 MG PO TABS: 10.0000 mg | ORAL_TABLET | Freq: Every day | ORAL | 3 refills | Status: DC

## 2023-04-19 NOTE — Progress Notes (Signed)
Patient ID: Haley [name], [name]   DOB: February 25, 1991, 32 y.o.   MRN: [id number] History of Present Illness: Haley [name] is a 32 year old white [name], with SO, [id number] in for a well woman gyn exam and pap.  PCP is Dr Margo Aye   Current Medications, Allergies, Past Medical History, Past Surgical History, Family History and Social History were reviewed in [employer] electronic medical record.     Review of Systems: Patient denies any headaches, hearing loss, fatigue, blurred vision, shortness of breath, chest pain, abdominal pain, problems with bowel movements, urination, or intercourse. No joint pain or mood swings.  +anxiety and depression   Physical Exam:BP 126/82 (BP Location: Left Arm, Patient Position: Sitting, Cuff Size: Normal)   Pulse 82   Ht 5\' 1"  (1.549 m)   Wt 104 lb 8 oz (47.4 kg)   LMP 04/10/2023   BMI 19.75 kg/m   General:  Well developed, well nourished, no acute distress Skin:  Warm and dry Neck:  Midline trachea, normal thyroid, good ROM, no lymphadenopathy Lungs; Clear to auscultation bilaterally Breast:  No dominant palpable mass, retraction, or nipple discharge Cardiovascular: Regular rate and rhythm Abdomen:  Soft, non tender, no hepatosplenomegaly Pelvic:  External genitalia is normal in appearance, no lesions.  The vagina is normal in appearance. Urethra has no lesions or masses. The cervix is smooth, pap with HR HPV genotyping performed.  Uterus is felt to be normal size, shape, and contour.  No adnexal masses or tenderness noted.Bladder is non tender, no masses felt. Extremities/musculoskeletal:  No swelling or varicosities noted, no clubbing or cyanosis Psych:  No mood changes, alert and cooperative,seems happy AA is 0 Fall risk is low    04/19/2023    1:34 PM 09/19/2021   11:44 AM 08/19/2020    1:41 PM  Depression screen PHQ 2/9  Decreased Interest 1 2 2   Down, Depressed, Hopeless 1 3 1   PHQ - 2 Score 2 5 3   Altered sleeping 2 3 2   Tired, decreased energy 1  2 1   Change in appetite 2 1 1   Feeling bad or failure about yourself  2 3 3   Trouble concentrating 1 2 2   Moving slowly or fidgety/restless 0 3 1  Suicidal thoughts 0 2 1  PHQ-9 Score 10 21 14        04/19/2023    1:34 PM 09/19/2021   11:44 AM 08/19/2020    1:41 PM  GAD 7 : Generalized Anxiety Score  Nervous, Anxious, on Edge 2 3 3   Control/stop worrying 3 3 3   Worry too much - different things 3 3 3   Trouble relaxing 2 3 3   Restless 2 3 2   Easily annoyed or irritable 2 3 3   Afraid - awful might happen 1 3 2   Total GAD 7 Score 15 21 19       Upstream - 04/19/23 1339       Pregnancy Intention Screening   Does the patient want to become pregnant in the next year? No    Does the patient's partner want to become pregnant in the next year? No    Would the patient like to discuss contraceptive options today? No      Contraception Wrap Up   Current Method Vasectomy    End Method Vasectomy    Contraception Counseling Provided No            Examination chaperoned by Malachy Mood LPN   Impression and Plan: 1. Routine general medical examination at a  health care facility Pap sent Pap in 3 years if normal Physical in 1 year Labs with PCP - Cytology - PAP( [facility])  2. Encounter for routine gynecological examination with Papanicolaou smear of cervix Pap sent   3. Anxiety and depression Will rx celexa 10 mg 1 daily Meds ordered this encounter  Medications   citalopram (CELEXA) 10 MG tablet    Sig: Take 1 tablet (10 mg total) by mouth daily.    Dispense:  30 tablet    Refill:  3    Order Specific Question:   Supervising Provider    Answer:   Duane Lope H [[id number]]     Follow up in 3 months for ROS

## 2023-04-23 LAB — CYTOLOGY - PAP
Adequacy: ABSENT
Comment: NEGATIVE
Diagnosis: NEGATIVE
High risk HPV: NEGATIVE

## 2023-05-24 ENCOUNTER — Other Ambulatory Visit: Payer: Self-pay | Admitting: Adult Health

## 2023-05-24 MED ORDER — CITALOPRAM HYDROBROMIDE 20 MG PO TABS: 20.0000 mg | ORAL_TABLET | Freq: Every day | ORAL | 2 refills | Status: DC

## 2023-05-24 NOTE — Progress Notes (Signed)
 Will increase celexa to 20 mg 1 daily

## 2023-05-30 ENCOUNTER — Other Ambulatory Visit: Payer: Self-pay | Admitting: Medical Genetics

## 2023-06-01 ENCOUNTER — Other Ambulatory Visit (HOSPITAL_COMMUNITY): Payer: Self-pay

## 2023-06-04 ENCOUNTER — Other Ambulatory Visit (HOSPITAL_COMMUNITY): Payer: Self-pay

## 2023-06-05 ENCOUNTER — Other Ambulatory Visit (HOSPITAL_COMMUNITY): Payer: Self-pay

## 2023-06-05 ENCOUNTER — Other Ambulatory Visit (HOSPITAL_COMMUNITY)
Admission: RE | Admit: 2023-06-05 | Discharge: 2023-06-05 | Disposition: A | Discharge status: Home / Self Care | Payer: Self-pay | Source: Ambulatory Visit | Attending: Oncology | Admitting: Oncology

## 2023-06-15 LAB — GENECONNECT MOLECULAR SCREEN: Genetic Analysis Overall Interpretation: NEGATIVE

## 2023-06-18 ENCOUNTER — Other Ambulatory Visit: Payer: Self-pay | Admitting: Adult Health

## 2023-07-16 ENCOUNTER — Ambulatory Visit: Payer: Medicaid Other | Admitting: Adult Health

## 2023-07-16 ENCOUNTER — Encounter: Payer: Self-pay | Admitting: Adult Health

## 2023-07-16 VITALS — BP 129/84 | HR 93 | Ht 61.0 in | Wt 103.0 lb

## 2023-07-16 DIAGNOSIS — Z1331 Encounter for screening for depression: Secondary | ICD-10-CM

## 2023-07-16 DIAGNOSIS — F32A Depression, unspecified: Secondary | ICD-10-CM | POA: Diagnosis not present

## 2023-07-16 DIAGNOSIS — F419 Anxiety disorder, unspecified: Secondary | ICD-10-CM

## 2023-07-16 MED ORDER — CITALOPRAM HYDROBROMIDE 20 MG PO TABS: 20.0000 mg | ORAL_TABLET | Freq: Every day | ORAL | 2 refills | Status: AC

## 2023-07-16 NOTE — Progress Notes (Signed)
 Subjective:     Patient ID: Haley [name], female   DOB: February 06, 1991, 33 y.o.   MRN: [id number]  HPI Haley [name] is a 33 year old white female, with So, [id number]  back in follow up on starting celexa 10 mg in December for anxiety and depression and then increased to 20 mg in January and is feeling much better. Her mom is currently in hospital for gallstones but has no gallbladder.     Component Value Date/Time   DIAGPAP  04/19/2023 1343    - Negative for intraepithelial lesion or malignancy (NILM)   DIAGPAP  07/17/2019 0934    - Negative for intraepithelial lesion or malignancy (NILM)   DIAGPAP (A) 09/03/2017 0000    LOW GRADE SQUAMOUS INTRAEPITHELIAL LESION: CIN-1/ HPV (LSIL).   HPVHIGH Negative 04/19/2023 1343   HPVHIGH Negative 07/17/2019 0934   ADEQPAP  04/19/2023 1343    Satisfactory for evaluation; transformation zone component ABSENT.   ADEQPAP  07/17/2019 0934    Satisfactory for evaluation; transformation zone component ABSENT.   ADEQPAP (A) 09/03/2017 0000    Satisfactory for evaluation  endocervical/transformation zone component PRESENT.    PCP is Dr Margo Aye  Review of Systems Feeling much better on celexa 20 mg 1 daily Reviewed past medical,surgical, social and family history. Reviewed medications and allergies.     Objective:   Physical Exam BP 129/84 (BP Location: Left Arm, Patient Position: Sitting, Cuff Size: Normal)   Pulse 93   Ht 5\' 1"  (1.549 m)   Wt 103 lb (46.7 kg)   LMP 07/07/2023 (Exact Date)   BMI 19.46 kg/m     Skin warm and dry. Lungs: clear to ausculation bilaterally. Cardiovascular: regular rate and rhythm.     07/16/2023    9:11 AM 04/19/2023    1:34 PM 09/19/2021   11:44 AM  Depression screen PHQ 2/9  Decreased Interest 0 1 2  Down, Depressed, Hopeless 0 1 3  PHQ - 2 Score 0 2 5  Altered sleeping 0 2 3  Tired, decreased energy 0 1 2  Change in appetite 0 2 1  Feeling bad or failure about yourself  0 2 3  Trouble concentrating 0 1 2  Moving slowly  or fidgety/restless 0 0 3  Suicidal thoughts 0 0 2  PHQ-9 Score 0 10 21       07/16/2023    9:12 AM 04/19/2023    1:34 PM 09/19/2021   11:44 AM 08/19/2020    1:41 PM  GAD 7 : Generalized Anxiety Score  Nervous, Anxious, on Edge 0 2 3 3   Control/stop worrying 0 3 3 3   Worry too much - different things 0 3 3 3   Trouble relaxing 1 2 3 3   Restless 1 2 3 2   Easily annoyed or irritable 0 2 3 3   Afraid - awful might happen 0 1 3 2   Total GAD 7 Score 2 15 21 19     Upstream - 07/16/23 4403       Pregnancy Intention Screening   Does the patient want to become pregnant in the next year? No    Does the patient's partner want to become pregnant in the next year? No    Would the patient like to discuss contraceptive options today? No      Contraception Wrap Up   Current Method Vasectomy    End Method Vasectomy    Contraception Counseling Provided No  Assessment:     1. Anxiety and depression (Primary) Feeling much better on celexa 20 mg 1 daily, will refill Meds ordered this encounter  Medications   citalopram (CELEXA) 20 MG tablet    Sig: Take 1 tablet (20 mg total) by mouth daily.    Dispense:  90 tablet    Refill:  2    **Patient requests 90 days supply**    Supervising Provider:   Lazaro Arms [[id number]]       Plan:     Follow up in 6 months or sooner if needed

## 2024-01-16 ENCOUNTER — Ambulatory Visit: Admitting: Adult Health

## 2024-01-16 ENCOUNTER — Encounter: Payer: Self-pay | Admitting: Adult Health

## 2024-01-16 VITALS — BP 126/88 | HR 79 | Ht 61.0 in | Wt 97.5 lb

## 2024-01-16 DIAGNOSIS — F418 Other specified anxiety disorders: Secondary | ICD-10-CM

## 2024-01-16 DIAGNOSIS — F419 Anxiety disorder, unspecified: Secondary | ICD-10-CM

## 2024-01-16 NOTE — Progress Notes (Signed)
 Subjective:     Patient ID: Haley [name], female   DOB: 11-17-1990, 33 y.o.   MRN: [id number]  HPI Haley [name] is a 33 year old white female, with SO, [id number]  in for 6 month follow up on being on celexa  20 mg and doing well. She says ritalin was recently increased and her grandma died and she has lost some weight but working on gaining it back.    Component Value Date/Time   DIAGPAP  04/19/2023 1343    - Negative for intraepithelial lesion or malignancy (NILM)   DIAGPAP  07/17/2019 0934    - Negative for intraepithelial lesion or malignancy (NILM)   DIAGPAP (A) 09/03/2017 0000    LOW GRADE SQUAMOUS INTRAEPITHELIAL LESION: CIN-1/ HPV (LSIL).   HPVHIGH Negative 04/19/2023 1343   HPVHIGH Negative 07/17/2019 0934   ADEQPAP  04/19/2023 1343    Satisfactory for evaluation; transformation zone component ABSENT.   ADEQPAP  07/17/2019 0934    Satisfactory for evaluation; transformation zone component ABSENT.   ADEQPAP (A) 09/03/2017 0000    Satisfactory for evaluation  endocervical/transformation zone component PRESENT.  PCP is Dr Shona  Review of Systems Doing well on celexa  Has lost some weight but working on gaining it back  Reviewed past medical,surgical, social and family history. Reviewed medications and allergies.     Objective:   Physical Exam BP 126/88 (BP Location: Left Arm, Patient Position: Sitting, Cuff Size: Normal)   Pulse 79   Ht 5' 1 (1.549 m)   Wt 97 lb 8 oz (44.2 kg)   LMP 12/27/2023 (Exact Date)   BMI 18.42 kg/m     Skin warm and dry.Lungs: clear to ausculation bilaterally. Cardiovascular: regular rate and rhythm.  Fall risk is low    01/16/2024    1:55 PM 07/16/2023    9:11 AM 04/19/2023    1:34 PM  Depression screen PHQ 2/9  Decreased Interest 0 0 1  Down, Depressed, Hopeless 0 0 1  PHQ - 2 Score 0 0 2  Altered sleeping 0 0 2  Tired, decreased energy 0 0 1  Change in appetite 0 0 2  Feeling bad or failure about yourself  0 0 2  Trouble concentrating 0 0 1   Moving slowly or fidgety/restless 0 0 0  Suicidal thoughts 0 0 0  PHQ-9 Score 0 0 10       01/16/2024    1:56 PM 07/16/2023    9:12 AM 04/19/2023    1:34 PM 09/19/2021   11:44 AM  GAD 7 : Generalized Anxiety Score  Nervous, Anxious, on Edge 0 0 2 3  Control/stop worrying 0 0 3 3  Worry too much - different things 0 0 3 3  Trouble relaxing 1 1 2 3   Restless 1 1 2 3   Easily annoyed or irritable 0 0 2 3  Afraid - awful might happen 0 0 1 3  Total GAD 7 Score 2 2 15 21       Upstream - 01/16/24 1401       Pregnancy Intention Screening   Does the patient want to become pregnant in the next year? No    Does the patient's partner want to become pregnant in the next year? No    Would the patient like to discuss contraceptive options today? No      Contraception Wrap Up   Current Method Vasectomy    End Method Vasectomy    Contraception Counseling Provided No  Assessment:     Anxiety and depression    Doing well with celexa  20 mg 1 daily, has refills  Plan:     Follow up in 5 months or sooner if needed

## 2024-05-18 ENCOUNTER — Other Ambulatory Visit: Payer: Self-pay | Admitting: Adult Health
